# Patient Record
Sex: Female | Born: 1970 | Race: Black or African American | Hispanic: No | Marital: Married | Smoking: Never smoker
Health system: Southern US, Community
[De-identification: ages and names within clinical notes are randomized; demographics above are authoritative.]

## PROBLEM LIST (undated history)

## (undated) DIAGNOSIS — R51 Headache: Secondary | ICD-10-CM

## (undated) DIAGNOSIS — IMO0001 Reserved for inherently not codable concepts without codable children: Secondary | ICD-10-CM

## (undated) DIAGNOSIS — D649 Anemia, unspecified: Secondary | ICD-10-CM

## (undated) DIAGNOSIS — K219 Gastro-esophageal reflux disease without esophagitis: Secondary | ICD-10-CM

## (undated) DIAGNOSIS — R519 Headache, unspecified: Secondary | ICD-10-CM

## (undated) DIAGNOSIS — M1990 Unspecified osteoarthritis, unspecified site: Secondary | ICD-10-CM

## (undated) DIAGNOSIS — G4730 Sleep apnea, unspecified: Secondary | ICD-10-CM

## (undated) DIAGNOSIS — K589 Irritable bowel syndrome without diarrhea: Secondary | ICD-10-CM

## (undated) DIAGNOSIS — F32A Depression, unspecified: Secondary | ICD-10-CM

## (undated) DIAGNOSIS — G4733 Obstructive sleep apnea (adult) (pediatric): Secondary | ICD-10-CM

## (undated) DIAGNOSIS — F419 Anxiety disorder, unspecified: Secondary | ICD-10-CM

## (undated) DIAGNOSIS — Z8489 Family history of other specified conditions: Secondary | ICD-10-CM

## (undated) DIAGNOSIS — J189 Pneumonia, unspecified organism: Secondary | ICD-10-CM

## (undated) DIAGNOSIS — J4540 Moderate persistent asthma, uncomplicated: Secondary | ICD-10-CM

## (undated) DIAGNOSIS — I10 Essential (primary) hypertension: Secondary | ICD-10-CM

## (undated) DIAGNOSIS — T8859XA Other complications of anesthesia, initial encounter: Secondary | ICD-10-CM

## (undated) DIAGNOSIS — M79673 Pain in unspecified foot: Secondary | ICD-10-CM

## (undated) DIAGNOSIS — E119 Type 2 diabetes mellitus without complications: Secondary | ICD-10-CM

## (undated) DIAGNOSIS — M797 Fibromyalgia: Secondary | ICD-10-CM

## (undated) DIAGNOSIS — T4145XA Adverse effect of unspecified anesthetic, initial encounter: Secondary | ICD-10-CM

## (undated) DIAGNOSIS — I447 Left bundle-branch block, unspecified: Secondary | ICD-10-CM

## (undated) DIAGNOSIS — F329 Major depressive disorder, single episode, unspecified: Secondary | ICD-10-CM

## (undated) DIAGNOSIS — M069 Rheumatoid arthritis, unspecified: Secondary | ICD-10-CM

## (undated) HISTORY — PX: OTHER SURGICAL HISTORY: SHX169

## (undated) HISTORY — DX: Essential (primary) hypertension: I10

## (undated) HISTORY — DX: Fibromyalgia: M79.7

## (undated) HISTORY — PX: CHOLECYSTECTOMY: SHX55

## (undated) HISTORY — PX: TONSILLECTOMY: SUR1361

## (undated) HISTORY — DX: Rheumatoid arthritis, unspecified: M06.9

## (undated) HISTORY — PX: LAPAROSCOPIC GASTRIC SLEEVE RESECTION: SHX5895

## (undated) HISTORY — PX: DILATION AND CURETTAGE OF UTERUS: SHX78

---

## 2002-04-06 ENCOUNTER — Emergency Department (HOSPITAL_COMMUNITY): Admission: EM | Admit: 2002-04-06 | Discharge: 2002-04-06 | Payer: Self-pay | Admitting: Emergency Medicine

## 2005-07-08 ENCOUNTER — Emergency Department (HOSPITAL_COMMUNITY): Admission: EM | Admit: 2005-07-08 | Discharge: 2005-07-09 | Payer: Self-pay | Admitting: Emergency Medicine

## 2006-01-21 ENCOUNTER — Ambulatory Visit (HOSPITAL_COMMUNITY): Admission: RE | Admit: 2006-01-21 | Discharge: 2006-01-21 | Payer: Self-pay | Admitting: Preventative Medicine

## 2006-11-27 ENCOUNTER — Ambulatory Visit (HOSPITAL_COMMUNITY): Admission: RE | Admit: 2006-11-27 | Discharge: 2006-11-27 | Payer: Self-pay | Admitting: Preventative Medicine

## 2007-07-28 ENCOUNTER — Emergency Department (HOSPITAL_COMMUNITY): Admission: EM | Admit: 2007-07-28 | Discharge: 2007-07-28 | Payer: Self-pay | Admitting: Emergency Medicine

## 2007-08-02 ENCOUNTER — Emergency Department (HOSPITAL_COMMUNITY): Admission: EM | Admit: 2007-08-02 | Discharge: 2007-08-02 | Payer: Self-pay | Admitting: Emergency Medicine

## 2007-12-20 ENCOUNTER — Emergency Department (HOSPITAL_COMMUNITY): Admission: EM | Admit: 2007-12-20 | Discharge: 2007-12-20 | Payer: Self-pay | Admitting: Emergency Medicine

## 2007-12-22 ENCOUNTER — Emergency Department (HOSPITAL_COMMUNITY): Admission: EM | Admit: 2007-12-22 | Discharge: 2007-12-22 | Payer: Self-pay | Admitting: Emergency Medicine

## 2008-08-30 ENCOUNTER — Inpatient Hospital Stay (HOSPITAL_COMMUNITY): Admission: AD | Admit: 2008-08-30 | Discharge: 2008-08-30 | Payer: Self-pay | Admitting: Obstetrics & Gynecology

## 2008-09-21 ENCOUNTER — Inpatient Hospital Stay (HOSPITAL_COMMUNITY): Admission: AD | Admit: 2008-09-21 | Discharge: 2008-09-21 | Payer: Self-pay | Admitting: Obstetrics & Gynecology

## 2008-09-27 ENCOUNTER — Encounter: Payer: Self-pay | Admitting: Obstetrics and Gynecology

## 2008-09-27 ENCOUNTER — Encounter: Payer: Self-pay | Admitting: Obstetrics & Gynecology

## 2008-09-27 ENCOUNTER — Other Ambulatory Visit: Admission: RE | Admit: 2008-09-27 | Discharge: 2008-09-27 | Payer: Self-pay | Admitting: Obstetrics & Gynecology

## 2008-09-27 ENCOUNTER — Ambulatory Visit: Payer: Self-pay | Admitting: Obstetrics and Gynecology

## 2008-09-27 LAB — CONVERTED CEMR LAB
BUN: 15 mg/dL (ref 6–23)
CO2: 22 meq/L (ref 19–32)
Calcium: 9 mg/dL (ref 8.4–10.5)
Chloride: 109 meq/L (ref 96–112)
Creatinine, Ser: 0.81 mg/dL (ref 0.40–1.20)
Glucose, Bld: 98 mg/dL (ref 70–99)

## 2008-10-18 ENCOUNTER — Ambulatory Visit: Payer: Self-pay | Admitting: Obstetrics and Gynecology

## 2009-05-19 ENCOUNTER — Emergency Department (HOSPITAL_COMMUNITY): Admission: EM | Admit: 2009-05-19 | Discharge: 2009-05-19 | Payer: Self-pay | Admitting: Emergency Medicine

## 2009-11-07 ENCOUNTER — Ambulatory Visit (HOSPITAL_COMMUNITY): Admission: RE | Admit: 2009-11-07 | Discharge: 2009-11-07 | Payer: Self-pay | Admitting: Obstetrics

## 2009-11-25 ENCOUNTER — Inpatient Hospital Stay (HOSPITAL_COMMUNITY): Admission: AD | Admit: 2009-11-25 | Discharge: 2009-11-25 | Payer: Self-pay | Admitting: Obstetrics & Gynecology

## 2009-12-05 ENCOUNTER — Ambulatory Visit (HOSPITAL_COMMUNITY): Admission: RE | Admit: 2009-12-05 | Discharge: 2009-12-05 | Payer: Self-pay | Admitting: Obstetrics

## 2010-01-02 ENCOUNTER — Ambulatory Visit (HOSPITAL_COMMUNITY): Admission: RE | Admit: 2010-01-02 | Discharge: 2010-01-02 | Payer: Self-pay | Admitting: Obstetrics

## 2010-01-30 ENCOUNTER — Ambulatory Visit (HOSPITAL_COMMUNITY): Admission: RE | Admit: 2010-01-30 | Discharge: 2010-01-30 | Payer: Self-pay | Admitting: Obstetrics

## 2010-02-27 ENCOUNTER — Ambulatory Visit (HOSPITAL_COMMUNITY): Admission: RE | Admit: 2010-02-27 | Discharge: 2010-02-27 | Payer: Self-pay | Admitting: Obstetrics

## 2010-03-05 ENCOUNTER — Ambulatory Visit (HOSPITAL_COMMUNITY): Admission: RE | Admit: 2010-03-05 | Discharge: 2010-03-05 | Payer: Self-pay | Admitting: Obstetrics

## 2010-03-12 ENCOUNTER — Ambulatory Visit (HOSPITAL_COMMUNITY): Admission: RE | Admit: 2010-03-12 | Discharge: 2010-03-12 | Payer: Self-pay | Admitting: Obstetrics

## 2010-03-19 ENCOUNTER — Ambulatory Visit (HOSPITAL_COMMUNITY): Admission: RE | Admit: 2010-03-19 | Discharge: 2010-03-19 | Payer: Self-pay | Admitting: Obstetrics

## 2010-03-26 ENCOUNTER — Ambulatory Visit: Payer: Self-pay | Admitting: Obstetrics and Gynecology

## 2010-03-26 ENCOUNTER — Ambulatory Visit (HOSPITAL_COMMUNITY): Admission: RE | Admit: 2010-03-26 | Discharge: 2010-03-26 | Payer: Self-pay | Admitting: Obstetrics

## 2010-03-26 ENCOUNTER — Inpatient Hospital Stay (HOSPITAL_COMMUNITY): Admission: AD | Admit: 2010-03-26 | Discharge: 2010-03-26 | Payer: Self-pay | Admitting: Obstetrics

## 2010-04-06 ENCOUNTER — Ambulatory Visit: Payer: Self-pay | Admitting: Advanced Practice Midwife

## 2010-04-06 ENCOUNTER — Inpatient Hospital Stay (HOSPITAL_COMMUNITY): Admission: AD | Admit: 2010-04-06 | Discharge: 2010-04-06 | Payer: Self-pay | Admitting: Obstetrics

## 2010-06-07 ENCOUNTER — Emergency Department (HOSPITAL_COMMUNITY): Admission: EM | Admit: 2010-06-07 | Discharge: 2010-06-07 | Payer: Self-pay | Admitting: Emergency Medicine

## 2010-06-07 ENCOUNTER — Inpatient Hospital Stay (HOSPITAL_COMMUNITY)
Admission: AD | Admit: 2010-06-07 | Discharge: 2010-06-07 | Payer: Self-pay | Source: Home / Self Care | Admitting: Obstetrics

## 2010-09-06 ENCOUNTER — Inpatient Hospital Stay (HOSPITAL_COMMUNITY): Admission: AD | Admit: 2010-09-06 | Discharge: 2010-09-06 | Payer: Self-pay | Admitting: Obstetrics

## 2010-09-06 ENCOUNTER — Ambulatory Visit: Payer: Self-pay | Admitting: Obstetrics and Gynecology

## 2010-10-21 ENCOUNTER — Inpatient Hospital Stay (HOSPITAL_COMMUNITY)
Admission: AD | Admit: 2010-10-21 | Discharge: 2010-10-21 | Payer: Self-pay | Source: Home / Self Care | Attending: Obstetrics | Admitting: Obstetrics

## 2010-10-26 ENCOUNTER — Observation Stay (HOSPITAL_COMMUNITY)
Admission: AD | Admit: 2010-10-26 | Discharge: 2010-10-28 | Payer: Self-pay | Source: Home / Self Care | Attending: Obstetrics | Admitting: Obstetrics

## 2010-10-27 IMAGING — US US OB LIMITED
1 series · 9 of 9 positions shown · non-contrast
Comparison: none

OBSTETRICAL ULTRASOUND:
 This ultrasound was performed in The [HOSPITAL], and the [facility] report will be stored to [REDACTED] PACS.  This report is also available in [HOSPITAL]?s accessANYware.

[Series 1: us ob limited · 9 acquisitions, 9 frames shown]
[im 1/9]
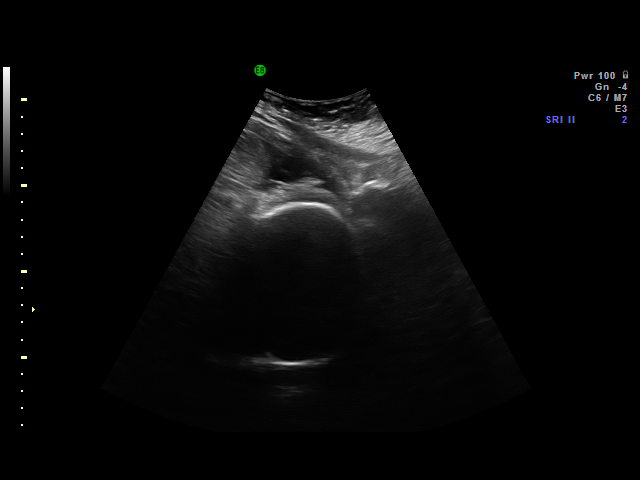
[im 2/9]
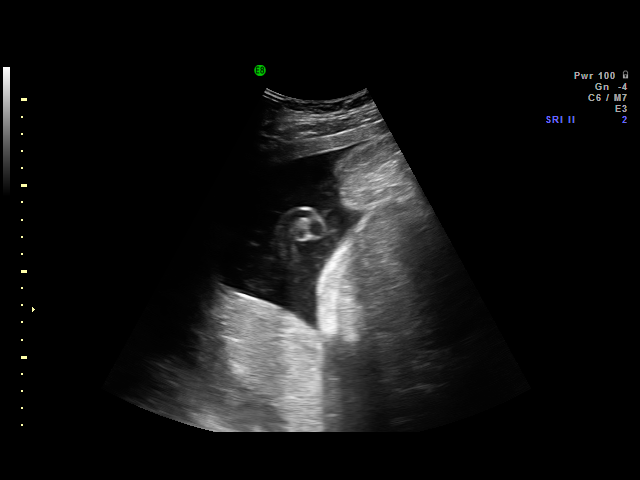
[im 3/9]
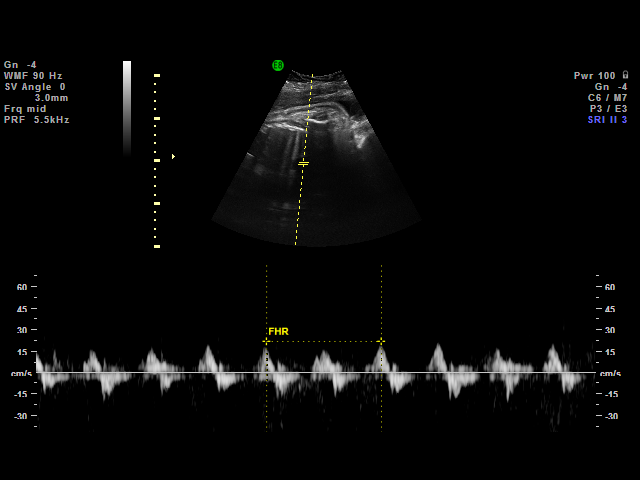
[im 4/9]
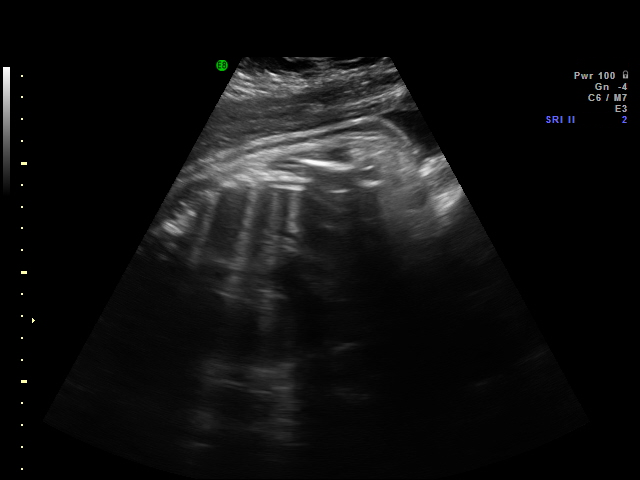
[im 5/9]
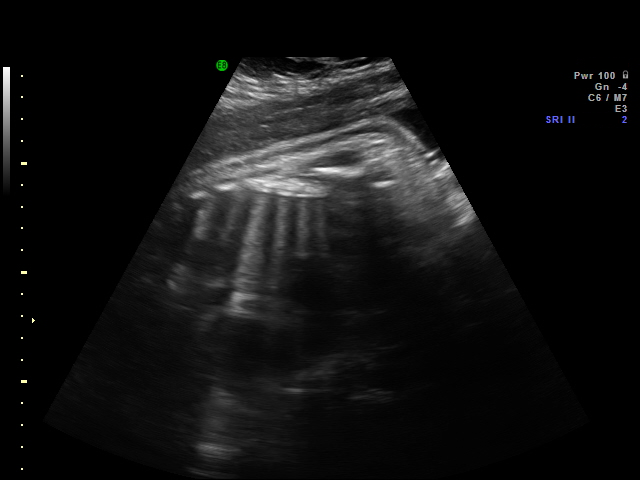
[im 6/9]
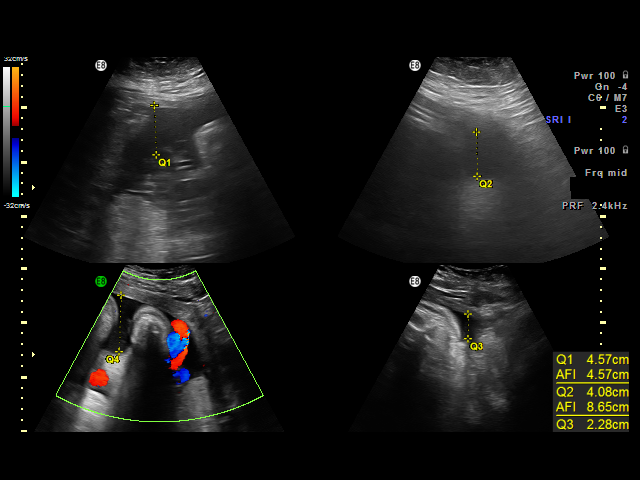
[im 7/9]
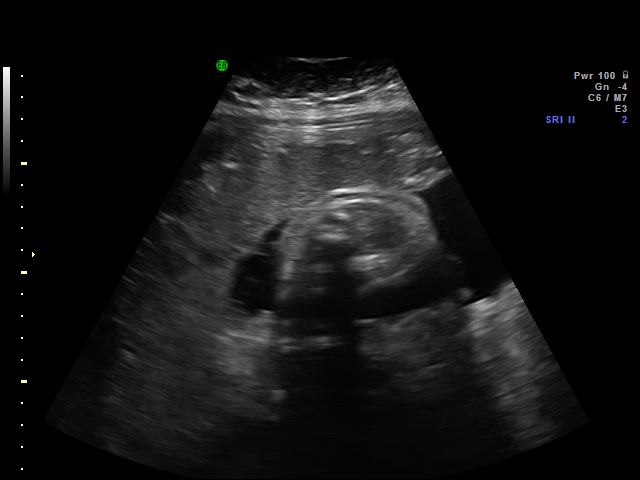
[im 8/9]
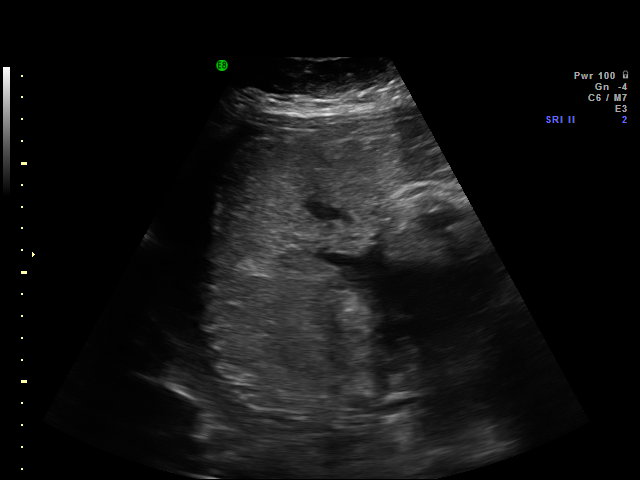
[im 9/9]
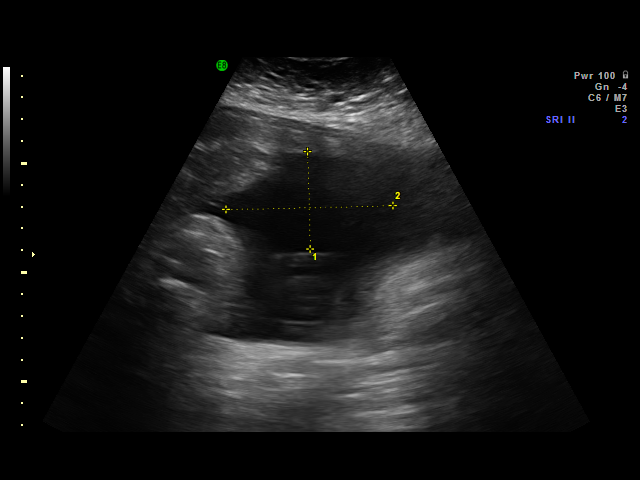

[9 of 9 positions shown; findings below may reference images not displayed]

IMPRESSION: [facility] has also been faxed to the ordering physician.

## 2010-11-11 ENCOUNTER — Emergency Department (HOSPITAL_COMMUNITY)
Admission: EM | Admit: 2010-11-11 | Discharge: 2010-11-11 | Payer: Self-pay | Source: Home / Self Care | Admitting: Emergency Medicine

## 2010-12-01 ENCOUNTER — Encounter: Payer: Self-pay | Admitting: Obstetrics

## 2011-01-20 LAB — CBC
HCT: 26.4 % — ABNORMAL LOW (ref 36.0–46.0)
Hemoglobin: 8.7 g/dL — ABNORMAL LOW (ref 12.0–15.0)
MCH: 26.4 pg (ref 26.0–34.0)
MCHC: 31.9 g/dL (ref 30.0–36.0)
MCHC: 33 g/dL (ref 30.0–36.0)
MCV: 82.6 fL (ref 78.0–100.0)
Platelets: 442 10*3/uL — ABNORMAL HIGH (ref 150–400)
RBC: 3.24 MIL/uL — ABNORMAL LOW (ref 3.87–5.11)
RDW: 13.9 % (ref 11.5–15.5)
WBC: 8.3 10*3/uL (ref 4.0–10.5)

## 2011-01-20 LAB — WET PREP, GENITAL: Clue Cells Wet Prep HPF POC: NONE SEEN

## 2011-01-20 LAB — SAMPLE TO BLOOD BANK

## 2011-01-20 LAB — GC/CHLAMYDIA PROBE AMP, GENITAL: Chlamydia, DNA Probe: NEGATIVE

## 2011-01-22 LAB — CBC
Hemoglobin: 12.6 g/dL (ref 12.0–15.0)
MCV: 85.6 fL (ref 78.0–100.0)
Platelets: 336 10*3/uL (ref 150–400)
RBC: 4.4 MIL/uL (ref 3.87–5.11)
WBC: 7 10*3/uL (ref 4.0–10.5)

## 2011-01-27 LAB — COMPREHENSIVE METABOLIC PANEL
ALT: 29 U/L (ref 0–35)
ALT: 33 U/L (ref 0–35)
AST: 28 U/L (ref 0–37)
Albumin: 2.9 g/dL — ABNORMAL LOW (ref 3.5–5.2)
Alkaline Phosphatase: 113 U/L (ref 39–117)
BUN: 6 mg/dL (ref 6–23)
CO2: 21 mEq/L (ref 19–32)
Calcium: 8.7 mg/dL (ref 8.4–10.5)
GFR calc Af Amer: >60 mL/min (ref >60)
GFR calc non Af Amer: >60 mL/min (ref >60)
Glucose, Bld: 173 mg/dL — ABNORMAL HIGH (ref 70–99)
Potassium: 4.1 mEq/L (ref 3.5–5.1)
Sodium: 134 mEq/L — ABNORMAL LOW (ref 135–145)
Sodium: 137 mEq/L (ref 135–145)
Total Protein: 5.6 g/dL — ABNORMAL LOW (ref 6.0–8.3)

## 2011-01-27 LAB — CBC
HCT: 37.8 % (ref 36.0–46.0)
Hemoglobin: 12.8 g/dL (ref 12.0–15.0)
MCHC: 33.1 g/dL (ref 30.0–36.0)
MCHC: 33.8 g/dL (ref 30.0–36.0)
Platelets: 419 10*3/uL — ABNORMAL HIGH (ref 150–400)
RBC: 4.35 MIL/uL (ref 3.87–5.11)
RDW: 14.5 % (ref 11.5–15.5)

## 2011-01-27 LAB — LACTATE DEHYDROGENASE: LDH: 177 U/L (ref 94–250)

## 2011-02-05 ENCOUNTER — Ambulatory Visit: Payer: Medicaid Other

## 2011-02-12 ENCOUNTER — Encounter (HOSPITAL_COMMUNITY)
Admission: RE | Admit: 2011-02-12 | Discharge: 2011-02-12 | Disposition: A | Discharge status: Home / Self Care | Payer: Medicaid Other | Source: Ambulatory Visit | Attending: Obstetrics and Gynecology | Admitting: Obstetrics and Gynecology

## 2011-02-12 LAB — CBC
HCT: 32 % — ABNORMAL LOW (ref 36.0–46.0)
MCHC: 29.4 g/dL — ABNORMAL LOW (ref 30.0–36.0)
MCV: 66.7 fL — ABNORMAL LOW (ref 78.0–100.0)
RDW: 20.6 % — ABNORMAL HIGH (ref 11.5–15.5)
WBC: 7.9 10*3/uL (ref 4.0–10.5)

## 2011-02-13 NOTE — H&P (Signed)
NAME:  Michele Craig, Michele Craig                ACCOUNT NO.:  [id number]  MEDICAL RECORD NO.:  [id number]         PATIENT TYPE:  WAMB  LOCATION:                                [facility]  PHYSICIAN:  Osborn Coho, M.D.   DATE OF BIRTH:  09-24-1971  DATE OF ADMISSION:  02/19/2011 DATE OF DISCHARGE:                             HISTORY & PHYSICAL   HISTORY OF PRESENT ILLNESS:  Ms. Breslin is a 40 year old married black female, para 4-0-1-4, presenting for hysteroscopy, D and C with endometrial ablation because of menorrhagia and anemia.  The patient states that since her delivery in March 2011, she has experienced continuous vaginal bleeding.  The patient initially bled for three consecutive months requiring her to change two overnight pads, due to clots, every hour.  She occasionally will experience cramping but primarily had lower back pain that she rated as a 10/10 on a 10-point pain scale.  The patient did find some relief from her lower back pain with ibuprofen 800 mg decreasing it to 7/10 on a 10-point pain scale. For this prolonged heavy bleeding, the patient was given IV Premarin, oral Premarin and oral contraceptives, however, the latter actually increased her vaginal bleeding.  The patient was then placed on oral estrogen with birth control pills followed by  Depo-Provera. None of  these measures achieved adequate bleeding control. She denies any changes in her bowel habits, urinary tract symptoms, vaginitis symptoms, dizziness, or chest pain.  She does admit to fatigue and shortness of breath with exertion.  Since November 2011, being on Depo-Provera, the patient has had primarily spotting on a daily basis with occasional gushes of blood without any warning.  A pelvic ultrasound in December 2011 showed a uterus measuring 12.1 x 7.2 x 7.8 cm with an endometrium measuring 19 mm.  Her right ovary was not visualized due to bowel gas and her left ovary measured 3.9 x 2.9 x 2.3 cm and  appeared to have normal size and echotexture.  There were no adnexal masses seen and only a small amount of free fluid was in the pelvis.  It is interesting to note that a comment on the ultrasound report showed that there was mildly heterogeneous echotexture throughout the uterus which could possibly be adenomyosis.  The patient's hemoglobin/hematocrit has been as low as 8.7/26 respectively.  However, her most recent results in March 2012 showed a hemoglobin of 9, hematocrit 30.6.  The patient's TSH and prolactin tests were both normal.  Given the protracted and debilitating nature of her vaginal bleeding, the patient reviewed both medical and surgical management options.  Having utilized the majority of the medical options with suboptimal results, the patient has decided to proceed with hysteroscopy, D and C, and endometrial ablation.  OBSTETRICAL HISTORY:  Gravida 5, para 4-0-1-4.  The patient had a history of gestational diabetes.  The patient had cesarean section in 1995, spontaneous vaginal birth in 1997, spontaneous vaginal birth in 2001, and a cesarean section in 2011.  GYNECOLOGIC HISTORY:  Menarche at 40 years old.  Last menstrual period, see history of present illness.  The patient has a remote  history of an abnormal Pap smear, however, it was simply repeated and had been normal since that time.  She denies any history of sexually transmitted diseases.  Her last Pap smear was in 2012.  MEDICAL HISTORY: 1. Asthma which has never required hospitalization or intubation. 2. Anemia. 3. Seasonal allergies. 4. Hypercholesterolemia. 5. Depression.  SURGICAL HISTORY: 1. In 1992, tonsillectomy. 2. In 1999, cholecystectomy. 3. In 2011, bilateral tubal ligation. The patient reports that she has a history of bleeding cessation with spinal anesthesia.  She does have a history of a blood transfusion in 1995.  FAMILY HISTORY:  Unknown due to her being adopted.  HABITS:  She does  not use alcohol, tobacco, or illicit drugs.  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  CURRENT MEDICATIONS: 1. Advair 250/50 as directed. 2. Albuterol as needed. 3. Singulair daily. 4. Zyrtec daily as needed. 5. Depo-Provera 150 mg IM every 12 weeks. 6. Ferrous sulfate one tablet daily.  The patient is allergic to [facility], which causes excessive vomiting and swelling inside her mouth and "internal organs."  Denies any sensitivities to latex, soy, or peanuts.  REVIEW OF SYSTEMS:  The patient does have a left wrist pain secondary to carpal tunnel syndrome.  Denies any chest pain, headache, vision changes, dizziness, nausea, vomiting, diarrhea, vaginitis symptoms, urinary tract symptoms, myalgias, arthralgias, skin rashes, and except as mentioned in history of present illness, the patient's review of systems is otherwise negative.  PHYSICAL EXAMINATION:  VITAL SIGNS:  Blood pressure is 140/78, pulse is 100, respirations 18, temperature 98.2 degrees Fahrenheit orally, weight 409 pounds, height 5 feet 7-1/2 inches tall.  Body mass index 63.1. NECK:  Supple without masses.  There is no thyromegaly or cervical adenopathy. HEART:  Tachycardic with regular rhythm. LUNGS:  Clear. BACK:  No CVA tenderness. ABDOMEN:  No tenderness, guarding, rebound, masses, or organomegaly. EXTREMITIES:  No clubbing, cyanosis, or edema. PELVIC:  EG/BUS is normal.  Vagina is normal.  Cervix is nontender without lesions.  Uterus is unable to be assessed secondary to body habitus.  Adnexa no tenderness or masses.  IMPRESSION: 1. Menorrhagia. 2. Anemia.  DISPOSITION:  A discussion was held with the patient regarding the indications for her procedures along with their risks which include but are not limited to reaction to anesthesia, damage to adjacent organs, infection, excessive bleeding, and the possibility that the patient's  bleeding may not be totally controlled with the  ablation procedure.  The patient verbalized understanding of these risks and has consented to proceed  with hysteroscopy, D and C with endometrial ablation at [facility] on February 19, 2011, at 11 o'clock a.m.     Elmira J. Lowell Guitar, P.A.-C   ______________________________ Osborn Coho, M.D.    EJP/MEDQ  D:  02/05/2011  T:  02/06/2011  Job:  [id number]  Electronically Signed by Raylene Everts. on 02/07/2011 01:12:01 PM Electronically Signed by Osborn Coho M.D. on 02/13/2011 10:08:23 AM

## 2011-02-19 ENCOUNTER — Ambulatory Visit (HOSPITAL_COMMUNITY)
Admission: RE | Admit: 2011-02-19 | Discharge: 2011-02-19 | Disposition: A | Discharge status: Home / Self Care | Payer: Medicaid Other | Source: Ambulatory Visit | Attending: Obstetrics and Gynecology | Admitting: Obstetrics and Gynecology

## 2011-02-19 ENCOUNTER — Other Ambulatory Visit: Payer: Self-pay | Admitting: Obstetrics and Gynecology

## 2011-02-19 DIAGNOSIS — D649 Anemia, unspecified: Secondary | ICD-10-CM | POA: Insufficient documentation

## 2011-02-19 DIAGNOSIS — N920 Excessive and frequent menstruation with regular cycle: Secondary | ICD-10-CM | POA: Insufficient documentation

## 2011-03-06 ENCOUNTER — Emergency Department (HOSPITAL_COMMUNITY)
Admission: EM | Admit: 2011-03-06 | Discharge: 2011-03-06 | Disposition: A | Discharge status: Home / Self Care | Payer: Medicaid Other | Attending: Emergency Medicine | Admitting: Emergency Medicine

## 2011-03-06 ENCOUNTER — Emergency Department (HOSPITAL_COMMUNITY): Payer: Medicaid Other

## 2011-03-06 DIAGNOSIS — J40 Bronchitis, not specified as acute or chronic: Secondary | ICD-10-CM | POA: Insufficient documentation

## 2011-03-06 DIAGNOSIS — R0989 Other specified symptoms and signs involving the circulatory and respiratory systems: Secondary | ICD-10-CM | POA: Insufficient documentation

## 2011-03-06 DIAGNOSIS — R05 Cough: Secondary | ICD-10-CM | POA: Insufficient documentation

## 2011-03-06 DIAGNOSIS — R059 Cough, unspecified: Secondary | ICD-10-CM | POA: Insufficient documentation

## 2011-03-06 DIAGNOSIS — R0609 Other forms of dyspnea: Secondary | ICD-10-CM | POA: Insufficient documentation

## 2011-03-06 DIAGNOSIS — J069 Acute upper respiratory infection, unspecified: Secondary | ICD-10-CM | POA: Insufficient documentation

## 2011-03-06 DIAGNOSIS — J45909 Unspecified asthma, uncomplicated: Secondary | ICD-10-CM | POA: Insufficient documentation

## 2011-03-20 NOTE — Op Note (Signed)
  NAME:  Michele Craig, Michele Craig                ACCOUNT NO.:  [id number]  MEDICAL RECORD NO.:  [id number]           PATIENT TYPE:  O  LOCATION:  [facility]                          [facility]  [facility]  PHYSICIAN:  Osborn Coho, M.D.   DATE OF BIRTH:  01/26/1971  DATE OF PROCEDURE:  02/19/2011 DATE OF DISCHARGE:                              OPERATIVE REPORT   PREOPERATIVE DIAGNOSES: 1. Menorrhagia. 2. Anemia.  POSTOPERATIVE DIAGNOSES: 1. Menorrhagia. 2. Anemia.  PROCEDURE: 1. Hysteroscopy. 2. D and C. 3. Ablation via NovaSure.  ATTENDING:  Osborn Coho, MD.  ANESTHESIA:  General.  SPECIMENS TO PATHOLOGY:  Endometrial curettings.  FINDINGS:  Uterus sounded to 13-1/2 cm.  Cervical length measured 6 cm. Cavity length measured 6-1/2 cm.  Cavity width measured 4.9 cm and ablation was performed for 1 minute and 25 seconds at a power of 175 watts.  FLUIDS:  500 mL  URINE OUTPUT:  Quantity sufficient via straight cath prior to procedure.  ESTIMATED BLOOD LOSS:  Minimal.  HYSTEROSCOPIC FLUID DEFICIT:  125 mL  COMPLICATIONS:  None.  DESCRIPTION OF PROCEDURE:  The patient was taken to the operating room after risks, benefits, and alternatives were discussed with the patient. The patient verbalized understanding, and consent signed and witness. The patient was placed under general per Anesthesia, and prepped and draped in the normal sterile fashion in the dorsal lithotomy position. A bivalve speculum was placed in the patient's vagina and the anterior lip of the cervix was grasped with single-tooth tenaculum.  A paracervical block was administered using a total of 10 mL of 1% lidocaine.  The uterus sounded to 13-1/2 cm and the hysteroscope was placed into the uterine cavity and no obvious cavitary lesions were noted.  The hysteroscope was removed and ablation was performed without difficulty.  The hysteroscope was reintroduced and good ablation results were noted, except for small portion,  may be the left lateral sidewall of the uterine cavity and the lower uterine segment.  All instruments were removed.  There was good hemostasis at the tenaculum site.  Count was correct.  The patient tolerated the procedure well and was returned to the recovery room in good condition.     Osborn Coho, M.D.     AR/MEDQ  D:  02/19/2011  T:  02/20/2011  Job:  [id number]  Electronically Signed by Osborn Coho M.D. on 03/20/2011 07:39:07 AM

## 2011-03-25 NOTE — Group Therapy Note (Signed)
NAME:  Michele Craig, Michele Craig                ACCOUNT NO.:  [id number]   MEDICAL RECORD NO.:  [id number]          PATIENT TYPE:  WOC   LOCATION:  [facility]                   FACILITY:  [facility]   PHYSICIAN:  Argentina Donovan, MD        DATE OF BIRTH:  [birth year]   DATE OF SERVICE:  09/27/2008                                  CLINIC NOTE   The patient is a 40 year old African-American female, gravida 3, para 3-  0-0-3, her youngest child 25 years old, who over the past year has  started having markedly irregular periods and was seen in the MAU a  month ago because of a long painful heavy bleeding episode.  Ultrasound  at that time showed endometrial thickening of 2.1 cm.  The patient is 5  feet 6 inches tall and weighs 377 pounds and probably has polycystic  ovarian syndrome.  The patient has had a past history of C-section,  gallbladder removal and tonsillectomy.  She is on medication for asthma,  an __________ inhaler, Advair, she takes Zyrtec, and she has been taking  Toradol recently since she was in the emergency room for abdominal  cramping.   PHYSICAL EXAMINATION:  GENERAL:  She is a very large African-American  female.  ABDOMEN:  Soft, flat, nontender.  PELVIC:  The external genitalia is normal.  BUS within normal limits.  Vagina is clean and well rugated.  The cervix is hypertrophied, parous  and well epithelialized.  The uterus and adnexa could not be palpated,  though the uterus is normal on ultrasound.  The uterus was sounded to a  depth of 10 cm after Pap smear was taken, and endometrial biopsy was  done without incident.   I am going to have the patient come back in 2 weeks for the results of  her endometrial biopsy, as well as a hemoglobin A1c and a CMET that we  have drawn today.  I have talked to her about the treatments and that it  would depend on whether she has atypical endometrial hyperplasia or a  regular endometrial hyperplasia that we should be treating her with  regular  withdrawals or we could use an IUD Mirena type.  Depo-Provera I  have basically been against a woman this size because of the weight  problem that she has anyway.  Her blood pressure today is 116/69 with a  pulse of 79.   IMPRESSION:  1. Endometrial hyperplasia by ultrasound.  2. Morbid obesity.  3. Oligomenorrhea with menorrhagia.           ______________________________  Argentina Donovan, MD     PR/MEDQ  D:  09/27/2008  T:  09/27/2008  Job:  [id number]

## 2011-06-13 ENCOUNTER — Other Ambulatory Visit: Payer: Self-pay | Admitting: Obstetrics

## 2011-06-27 ENCOUNTER — Other Ambulatory Visit: Payer: Self-pay | Admitting: Obstetrics

## 2011-08-05 ENCOUNTER — Encounter: Payer: Self-pay | Admitting: *Deleted

## 2011-08-05 ENCOUNTER — Encounter: Payer: Medicaid Other | Attending: Physician Assistant | Admitting: *Deleted

## 2011-08-05 DIAGNOSIS — Z713 Dietary counseling and surveillance: Secondary | ICD-10-CM | POA: Insufficient documentation

## 2011-08-05 NOTE — Progress Notes (Signed)
  Medical Nutrition Therapy:  Appt start time: 1115 end time:  1215.   Assessment:  Primary concerns today: weight management and diabetes. Pt is here today for weight and diabetes management. She plans to have bariatric surgery (gastric bypass) with Dr. Lily Peer in [place]. She is here today to get a jump start on her weight loss and control her diabetes in preparation for surgery.  MEDICATIONS: See updated medication list.   DIETARY INTAKE:  Usual eating pattern includes 2-3 meals and 1-2 snacks per day. Pt has recently had a problem with meal skipping yet has gotten better in the past 6 months.  24-hr recall:  B (8-9 AM): Atkin's Protein or Oh Yeah Protein Shake  Snk (AM): celery or pretzel (sometimes) L (12-1 PM): Stuffed chicken breast (w/ spinach/cheese) OR Spinach salad w/ chicken Snk (PM): celery OR carrot sticks OR 100-Calorie Packs D (6-8 PM): Spinach salad w/ chicken OR Baked chicken/Steak w/ vegetables and starch (bread, potatoes, etc) OR Spaghetti w/ meat sauce Snk (PM): Ice cream OR Blue Bunny Carb control ice cream Beverages: Water, Protein shake, Crystal Light, Diet Soda  Usual physical activity: Limited based on weight and back problems  Estimated energy needs: 2500 calories 280 g carbohydrates 175 g protein 75 g fat  CBG monitoring: Daily (fasting, 2 hrs after breakfast) CBG range: 97-101 (fasting), 113-140 (2 hrs after supper) Last A1c level: 7.0%  Progress Towards Goal(s):  In progress.   Nutritional Diagnosis:  [place]-3.3 Overweight/obesity As related to poor dietary habits and physical inactivity.  As evidenced by pt with BMI of 66%.    Intervention:  Nutrition education.  Handouts given during visit include:  Core I Diabetes Education Information  ADA's HgbA1c: "What's My Number"  Pre-Op Bariatric Surgery Diet  Monitoring/Evaluation:  Dietary intake, exercise, weight loss, and body weight and follow up in one month.

## 2011-08-05 NOTE — Patient Instructions (Addendum)
Goals:  Eat 3 meals/day, Avoid meal skipping   Increase protein rich foods  Follow "Plate Method" for portion/carbohydrate control  Limit carbohydrate1-2 servings/meal   Aim for >30 min of physical activity daily, as able  Limit sugar-sweetened beverages and concentrated sweets  Follow Bariatric Surgery Pre-Op Diet for weight loss before surgery  1500 kcal diet  80-100 grams protein  100-150 grams carbohdyrate

## 2011-08-12 LAB — URINALYSIS, ROUTINE W REFLEX MICROSCOPIC
Bilirubin Urine: NEGATIVE
Specific Gravity, Urine: 1.025
Urobilinogen, UA: 0.2
pH: 5.5

## 2011-08-12 LAB — POCT PREGNANCY, URINE: Preg Test, Ur: NEGATIVE

## 2011-08-12 LAB — CBC
HCT: 39.2
Hemoglobin: 13.5
Hemoglobin: 13.2
MCHC: 33.2
RDW: 13.3
RDW: 13.3
WBC: 6.8

## 2011-08-12 LAB — URINE MICROSCOPIC-ADD ON

## 2011-08-12 LAB — WET PREP, GENITAL: WBC, Wet Prep HPF POC: NONE SEEN

## 2011-08-12 LAB — GC/CHLAMYDIA PROBE AMP, GENITAL: Chlamydia, DNA Probe: NEGATIVE

## 2011-08-21 LAB — URINE CULTURE: Colony Count: >=100000

## 2011-08-21 LAB — URINALYSIS, ROUTINE W REFLEX MICROSCOPIC
Bilirubin Urine: NEGATIVE
Glucose, UA: NEGATIVE
Leukocytes, UA: NEGATIVE
Nitrite: NEGATIVE
Specific Gravity, Urine: 1.015
pH: 6
pH: 6.5

## 2011-08-21 LAB — CBC
HCT: 43
MCV: 86.4
Platelets: 374
RDW: 12.9

## 2011-08-21 LAB — COMPREHENSIVE METABOLIC PANEL
Albumin: 3.4 — ABNORMAL LOW
BUN: 13
Creatinine, Ser: 0.86
Total Bilirubin: 0.5
Total Protein: 7.2

## 2011-08-21 LAB — DIFFERENTIAL
Basophils Absolute: 0.1
Lymphocytes Relative: 25
Monocytes Absolute: 0.7
Monocytes Relative: 8
Neutro Abs: 6

## 2011-08-21 LAB — URINE MICROSCOPIC-ADD ON

## 2011-09-02 ENCOUNTER — Ambulatory Visit: Payer: Medicaid Other | Admitting: *Deleted

## 2011-10-14 IMAGING — CR DG CHEST 2V
2 series · 2 of 2 positions shown · non-contrast
Comparison: Chest 12/22/2007.

CLINICAL DATA: Cough and shortness of breath.

CHEST - 2 VIEW

[w chest pa]
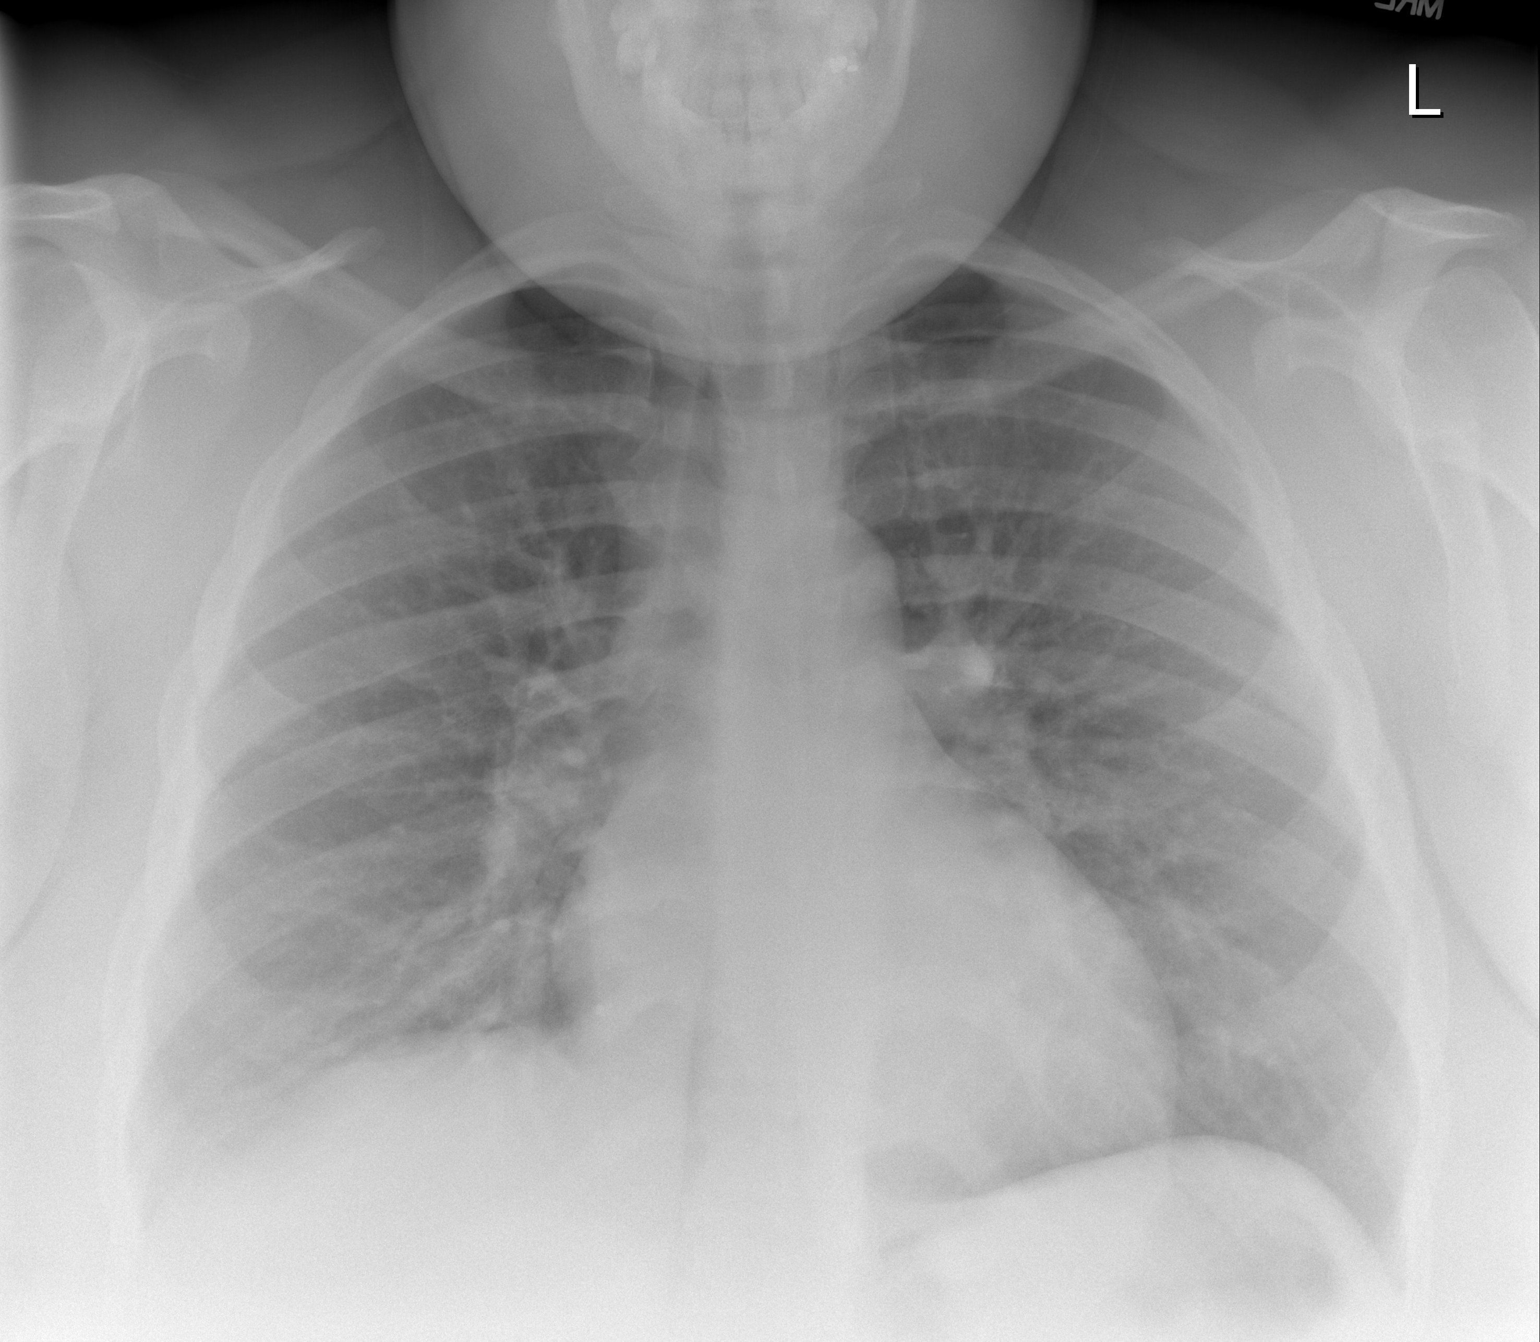

[w chest lat *]
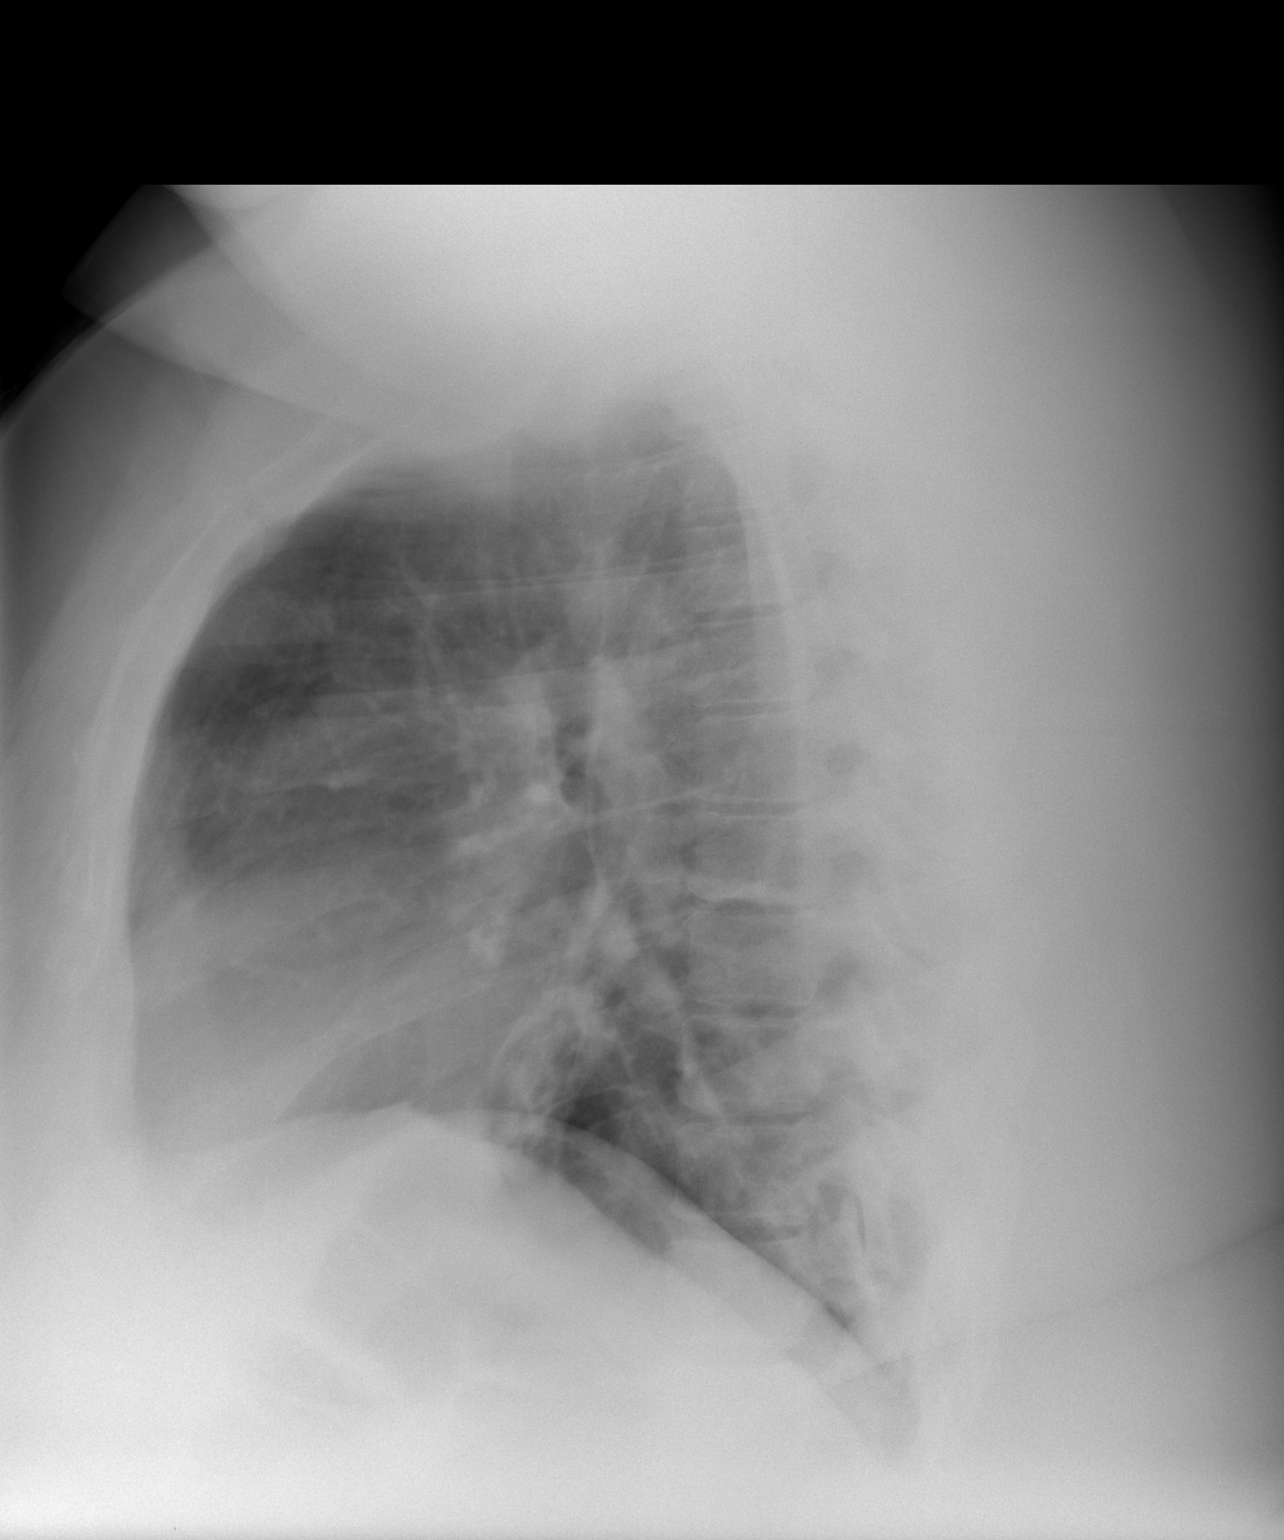

[2 of 2 positions shown; findings below may reference images not displayed]

FINDINGS: Peribronchial thickening is present.  No focal airspace
disease or pleural effusion.  Heart size normal.
IMPRESSION: Bronchitic change without focal process.

## 2011-10-18 ENCOUNTER — Encounter (HOSPITAL_COMMUNITY): Payer: Self-pay | Admitting: *Deleted

## 2011-10-18 ENCOUNTER — Emergency Department (HOSPITAL_COMMUNITY)
Admission: EM | Admit: 2011-10-18 | Discharge: 2011-10-18 | Disposition: A | Discharge status: Home / Self Care | Payer: Medicaid Other | Attending: Emergency Medicine | Admitting: Emergency Medicine

## 2011-10-18 DIAGNOSIS — I10 Essential (primary) hypertension: Secondary | ICD-10-CM | POA: Insufficient documentation

## 2011-10-18 DIAGNOSIS — E119 Type 2 diabetes mellitus without complications: Secondary | ICD-10-CM | POA: Insufficient documentation

## 2011-10-18 DIAGNOSIS — L0231 Cutaneous abscess of buttock: Secondary | ICD-10-CM | POA: Insufficient documentation

## 2011-10-18 DIAGNOSIS — L03317 Cellulitis of buttock: Secondary | ICD-10-CM | POA: Insufficient documentation

## 2011-10-18 DIAGNOSIS — J45909 Unspecified asthma, uncomplicated: Secondary | ICD-10-CM | POA: Insufficient documentation

## 2011-10-18 MED ORDER — LIDOCAINE-EPINEPHRINE 2 %-1:100000 IJ SOLN
INTRAMUSCULAR | Status: AC
  Administered 2011-10-18: 1 mL
  Filled 2011-10-18: qty 1

## 2011-10-18 MED ORDER — FLUCONAZOLE 150 MG PO TABS: 150.0000 mg | ORAL_TABLET | Freq: Every day | ORAL | Status: AC

## 2011-10-18 MED ORDER — HYDROCODONE-ACETAMINOPHEN 5-325 MG PO TABS: ORAL_TABLET | ORAL | Status: AC

## 2011-10-18 MED ORDER — DOXYCYCLINE HYCLATE 100 MG PO CAPS: 100.0000 mg | ORAL_CAPSULE | Freq: Two times a day (BID) | ORAL | Status: AC

## 2011-10-18 NOTE — ED Notes (Signed)
Abscess on coccyx area, started to drain last Sunday, now has smaller ones around area

## 2011-10-18 NOTE — ED Provider Notes (Signed)
Medical screening examination/treatment/procedure(s) were performed by non-physician practitioner and as supervising physician I was immediately available for consultation/collaboration.  Geoffery Lyons, MD 10/18/11 [phone]

## 2011-10-18 NOTE — ED Provider Notes (Signed)
History     CSN: [id number] Arrival date & time: 10/18/2011 12:42 PM   First MD Initiated Contact with Patient 10/18/11 1426      Chief Complaint  Patient presents with  . Abscess    buttocks    (Consider location/radiation/quality/duration/timing/severity/associated sxs/prior treatment) HPI Comments: Patient with swelling and, pain and drainage to upper right buttock for one week. Area has been draining for the past several days. Patient states she has new areas forming around the original area. These areas are painful. Patient denies fever or other abscesses in this area in the past. She has been taking Vicodin at home for pain with some mild relief.  Patient is a 40 y.o. female presenting with abscess. The history is provided by the patient and the spouse.  Abscess  This is a new problem. The current episode started more than one week ago. The onset was gradual. The problem has been gradually worsening. The abscess is present on the right buttock. The problem is moderate. The abscess is characterized by draining. Pertinent negatives include no fever and no vomiting.    Past Medical History  Diagnosis Date  . Asthma   . Hypertension   . Diabetes mellitus     Past Surgical History  Procedure Date  . Cesarean section   . Cholecystectomy   . Tonsillectomy   . Carpal tunnel release   . Dilation and curettage of uterus     No family history on file.  History  Substance Use Topics  . Smoking status: Never Smoker   . Smokeless tobacco: Not on file  . Alcohol Use: No    OB History    Grav Para Term Preterm Abortions TAB SAB Ect Mult Living                  Review of Systems  Constitutional: Negative for fever and chills.  Gastrointestinal: Negative for nausea and vomiting.  Musculoskeletal: Negative for myalgias.  Skin: Negative for color change and rash.    Allergies  Shrimp  Home Medications   Current Outpatient Rx  Name Route Sig Dispense Refill  .  ALBUTEROL SULFATE (2.5 MG/3ML) 0.083% IN NEBU Nebulization Take 2.5 mg by nebulization every 6 (six) hours as needed.      Marland Kitchen AMLODIPINE BESYLATE 10 MG PO TABS Oral Take 10 mg by mouth daily.      Marland Kitchen CETIRIZINE HCL 10 MG PO TABS Oral Take 10 mg by mouth daily.      Marland Kitchen FLUOXETINE HCL 20 MG PO CAPS Oral Take 20 mg by mouth daily.      Marland Kitchen FLUTICASONE-SALMETEROL 100-50 MCG/DOSE IN AEPB Inhalation Inhale 1 puff into the lungs every 12 (twelve) hours.      Marland Kitchen HYDROCODONE-ACETAMINOPHEN 5-500 MG PO TABS Oral Take 1 tablet by mouth every 6 (six) hours as needed. pain     . METFORMIN HCL ER (MOD) 500 MG PO TB24 Oral Take 500 mg by mouth daily with breakfast.      . MONTELUKAST SODIUM 10 MG PO TABS Oral Take 10 mg by mouth at bedtime.      . OMEPRAZOLE 10 MG PO CPDR Oral Take 10 mg by mouth daily.      Marland Kitchen PREDNISONE 20 MG PO TABS Oral Take 20 mg by mouth daily. Tapering dose finished yesterday       BP 112/63  Pulse 99  Temp(Src) 99.2 F (37.3 C) (Oral)  Resp 22  SpO2 96%  Physical Exam  Nursing note  and vitals reviewed. Constitutional: She is oriented to person, place, and time. She appears well-developed and well-nourished.  HENT:  Head: Normocephalic and atraumatic.  Eyes: Right eye exhibits no discharge. Left eye exhibits no discharge.  Genitourinary:       Draining abscess with approx 6cm induration right medial upper buttock with several surrounding areas of smaller induration. There is no signs of cellulitis.   Musculoskeletal: Normal range of motion.  Neurological: She is alert and oriented to person, place, and time.  Skin: Skin is warm and dry. No rash noted.  Psychiatric: She has a normal mood and affect.    ED Course  Procedures (including critical care time)  Labs Reviewed - No data to display No results found.   1. Abscess of right buttock    Patient seen and examined.  INCISION AND DRAINAGE Performed by: Carolee Rota Consent: Verbal consent obtained. Risks and  benefits: risks, benefits and alternatives were discussed Type: abscess  Body area: R upper buttock  Anesthesia: local infiltration  Local anesthetic: lidocaine 2% with epinephrine  Anesthetic total: 3 ml  Complexity: complex Blunt dissection to break up loculations  Drainage: purulent  Drainage amount: moderate  Packing material: 1/4 in iodoform gauze  Patient tolerance: Patient tolerated the procedure well with no immediate complications.  4:04 PM patient urged to follow up in 2 days to be the emergency department or with her primary care physician for packing removal and recheck. Patient urged to return sooner with worsening swelling, pain, redness, fever or she has any other concerns. Patient verbalizes understanding and agrees with plan.  4:05 PM Patient counseled on use of narcotic pain medications. Counseled not to combine these medications with others containing tylenol. Urged not to drink alcohol, drive, or perform any other activities that requires focus while taking these medications. The patient verbalizes understanding and agrees with the plan.     MDM  Patient with abscess, possibly pilonidal cyst, currently draining but I&D performed and packing placed. No sign of surrounding cellulitis. Patient placed on antibiotics given the size of the area involved.  Recheck indicated in 2 days for packing removal. Patient may need eventual surgical consult.        Eustace Moore [place], [place] 10/18/11 1606

## 2012-02-05 ENCOUNTER — Encounter: Payer: Self-pay | Admitting: Obstetrics and Gynecology

## 2012-04-01 ENCOUNTER — Inpatient Hospital Stay (HOSPITAL_COMMUNITY)
Admission: EM | Admit: 2012-04-01 | Discharge: 2012-04-04 | DRG: 125 | Disposition: A | Discharge status: Home / Self Care | Payer: Self-pay | Attending: Internal Medicine | Admitting: Internal Medicine

## 2012-04-01 ENCOUNTER — Encounter (HOSPITAL_COMMUNITY): Payer: Self-pay | Admitting: Emergency Medicine

## 2012-04-01 ENCOUNTER — Emergency Department (HOSPITAL_COMMUNITY): Payer: Self-pay

## 2012-04-01 DIAGNOSIS — J309 Allergic rhinitis, unspecified: Secondary | ICD-10-CM | POA: Diagnosis present

## 2012-04-01 DIAGNOSIS — I10 Essential (primary) hypertension: Secondary | ICD-10-CM

## 2012-04-01 DIAGNOSIS — L03211 Cellulitis of face: Secondary | ICD-10-CM

## 2012-04-01 DIAGNOSIS — E1165 Type 2 diabetes mellitus with hyperglycemia: Secondary | ICD-10-CM

## 2012-04-01 DIAGNOSIS — E119 Type 2 diabetes mellitus without complications: Secondary | ICD-10-CM | POA: Diagnosis present

## 2012-04-01 DIAGNOSIS — Z6841 Body Mass Index (BMI) 40.0 and over, adult: Secondary | ICD-10-CM

## 2012-04-01 DIAGNOSIS — L03213 Periorbital cellulitis: Secondary | ICD-10-CM | POA: Diagnosis present

## 2012-04-01 DIAGNOSIS — J45909 Unspecified asthma, uncomplicated: Secondary | ICD-10-CM | POA: Diagnosis present

## 2012-04-01 DIAGNOSIS — H00039 Abscess of eyelid unspecified eye, unspecified eyelid: Principal | ICD-10-CM | POA: Diagnosis present

## 2012-04-01 DIAGNOSIS — L0201 Cutaneous abscess of face: Secondary | ICD-10-CM

## 2012-04-01 LAB — POCT I-STAT, CHEM 8
Calcium, Ion: 1.17 mmol/L (ref 1.12–1.32)
Glucose, Bld: 161 mg/dL — ABNORMAL HIGH (ref 70–99)
HCT: 39 % (ref 36.0–46.0)
Hemoglobin: 13.3 g/dL (ref 12.0–15.0)
Potassium: 3.6 mEq/L (ref 3.5–5.1)
TCO2: 20 mmol/L (ref 0–100)

## 2012-04-01 LAB — CBC
MCH: 23.7 pg — ABNORMAL LOW (ref 26.0–34.0)
MCHC: 32 g/dL (ref 30.0–36.0)
Platelets: 394 10*3/uL (ref 150–400)
RDW: 18 % — ABNORMAL HIGH (ref 11.5–15.5)

## 2012-04-01 LAB — DIFFERENTIAL
Basophils Absolute: 0.1 10*3/uL (ref 0.0–0.1)
Basophils Relative: 1 % (ref 0–1)
Eosinophils Absolute: 0.2 10*3/uL (ref 0.0–0.7)
Monocytes Relative: 7 % (ref 3–12)
Neutro Abs: 5.6 10*3/uL (ref 1.7–7.7)
Neutrophils Relative %: 61 % (ref 43–77)

## 2012-04-01 LAB — CREATININE, SERUM
Creatinine, Ser: 0.73 mg/dL (ref 0.50–1.10)
GFR calc non Af Amer: >90 mL/min (ref >90)

## 2012-04-01 MED ORDER — PIPERACILLIN-TAZOBACTAM 3.375 G IVPB
INTRAVENOUS | Status: AC
  Filled 2012-04-01: qty 50

## 2012-04-01 MED ORDER — LORATADINE 10 MG PO TABS
10.0000 mg | ORAL_TABLET | Freq: Every day | ORAL | Status: DC
  Administered 2012-04-01 – 2012-04-04 (×4): 10 mg via ORAL
  Filled 2012-04-01 (×4): qty 1

## 2012-04-01 MED ORDER — PANTOPRAZOLE SODIUM 40 MG PO TBEC
40.0000 mg | DELAYED_RELEASE_TABLET | Freq: Every day | ORAL | Status: DC
  Administered 2012-04-01 – 2012-04-03 (×3): 40 mg via ORAL
  Filled 2012-04-01 (×3): qty 1

## 2012-04-01 MED ORDER — SODIUM CHLORIDE 0.9 % IV SOLN
INTRAVENOUS | Status: AC
  Administered 2012-04-01: 16:00:00 via INTRAVENOUS

## 2012-04-01 MED ORDER — PIPERACILLIN-TAZOBACTAM 3.375 G IVPB
3.3750 g | Freq: Three times a day (TID) | INTRAVENOUS | Status: DC
  Administered 2012-04-01: 3.375 g via INTRAVENOUS
  Filled 2012-04-01 (×3): qty 50

## 2012-04-01 MED ORDER — FLUOXETINE HCL 20 MG PO CAPS
20.0000 mg | ORAL_CAPSULE | Freq: Every day | ORAL | Status: DC
  Administered 2012-04-01 – 2012-04-04 (×4): 20 mg via ORAL
  Filled 2012-04-01 (×4): qty 1

## 2012-04-01 MED ORDER — OXYCODONE-ACETAMINOPHEN 5-325 MG PO TABS
1.0000 | ORAL_TABLET | Freq: Once | ORAL | Status: AC
  Administered 2012-04-01: 1 via ORAL
  Filled 2012-04-01: qty 1

## 2012-04-01 MED ORDER — ENOXAPARIN SODIUM 40 MG/0.4ML ~~LOC~~ SOLN: 40.0000 mg | SUBCUTANEOUS | Status: DC

## 2012-04-01 MED ORDER — IOHEXOL 300 MG/ML  SOLN
75.0000 mL | Freq: Once | INTRAMUSCULAR | Status: AC | PRN
  Administered 2012-04-01: 75 mL via INTRAVENOUS

## 2012-04-01 MED ORDER — VANCOMYCIN HCL IN DEXTROSE 1-5 GM/200ML-% IV SOLN
1000.0000 mg | Freq: Four times a day (QID) | INTRAVENOUS | Status: DC
  Administered 2012-04-01 – 2012-04-04 (×10): 1000 mg via INTRAVENOUS
  Filled 2012-04-01 (×17): qty 200

## 2012-04-01 MED ORDER — VANCOMYCIN HCL IN DEXTROSE 1-5 GM/200ML-% IV SOLN
1000.0000 mg | Freq: Once | INTRAVENOUS | Status: AC
  Administered 2012-04-01: 1000 mg via INTRAVENOUS
  Filled 2012-04-01: qty 200

## 2012-04-01 MED ORDER — DIPHENHYDRAMINE HCL 25 MG PO CAPS
ORAL_CAPSULE | ORAL | Status: AC
  Administered 2012-04-01: 25 mg
  Filled 2012-04-01: qty 1

## 2012-04-01 MED ORDER — HYDROCODONE-ACETAMINOPHEN 5-325 MG PO TABS
1.0000 | ORAL_TABLET | ORAL | Status: DC | PRN
  Administered 2012-04-01: 1 via ORAL
  Administered 2012-04-01 – 2012-04-03 (×6): 2 via ORAL
  Administered 2012-04-03: 1 via ORAL
  Administered 2012-04-03 – 2012-04-04 (×3): 2 via ORAL
  Filled 2012-04-01 (×6): qty 2
  Filled 2012-04-01: qty 1
  Filled 2012-04-01: qty 2
  Filled 2012-04-01: qty 1
  Filled 2012-04-01 (×2): qty 2

## 2012-04-01 MED ORDER — ONDANSETRON HCL 4 MG/2ML IJ SOLN: 4.0000 mg | Freq: Four times a day (QID) | INTRAMUSCULAR | Status: DC | PRN

## 2012-04-01 MED ORDER — INSULIN ASPART 100 UNIT/ML ~~LOC~~ SOLN: 0.0000 [IU] | Freq: Every day | SUBCUTANEOUS | Status: DC

## 2012-04-01 MED ORDER — SODIUM CHLORIDE 0.9 % IV SOLN
Freq: Once | INTRAVENOUS | Status: AC
  Administered 2012-04-01: 14:00:00 via INTRAVENOUS

## 2012-04-01 MED ORDER — ACETAMINOPHEN 650 MG RE SUPP: 650.0000 mg | Freq: Four times a day (QID) | RECTAL | Status: DC | PRN

## 2012-04-01 MED ORDER — SODIUM CHLORIDE 0.9 % IV SOLN
INTRAVENOUS | Status: DC
  Administered 2012-04-03: via INTRAVENOUS

## 2012-04-01 MED ORDER — ENOXAPARIN SODIUM 100 MG/ML ~~LOC~~ SOLN
90.0000 mg | SUBCUTANEOUS | Status: DC
  Administered 2012-04-02 – 2012-04-03 (×2): 90 mg via SUBCUTANEOUS
  Filled 2012-04-01 (×5): qty 1

## 2012-04-01 MED ORDER — AMLODIPINE BESYLATE 5 MG PO TABS
10.0000 mg | ORAL_TABLET | Freq: Every day | ORAL | Status: DC
  Administered 2012-04-01 – 2012-04-04 (×4): 10 mg via ORAL
  Filled 2012-04-01: qty 2
  Filled 2012-04-01: qty 1
  Filled 2012-04-01: qty 2
  Filled 2012-04-01: qty 1
  Filled 2012-04-01: qty 2

## 2012-04-01 MED ORDER — INSULIN ASPART 100 UNIT/ML ~~LOC~~ SOLN
0.0000 [IU] | Freq: Three times a day (TID) | SUBCUTANEOUS | Status: DC
  Administered 2012-04-02: 3 [IU] via SUBCUTANEOUS
  Administered 2012-04-02: 2 [IU] via SUBCUTANEOUS
  Administered 2012-04-03: 3 [IU] via SUBCUTANEOUS
  Administered 2012-04-03: 2 [IU] via SUBCUTANEOUS

## 2012-04-01 MED ORDER — ACETAMINOPHEN 325 MG PO TABS: 650.0000 mg | ORAL_TABLET | Freq: Four times a day (QID) | ORAL | Status: DC | PRN

## 2012-04-01 MED ORDER — MONTELUKAST SODIUM 10 MG PO TABS
10.0000 mg | ORAL_TABLET | Freq: Every day | ORAL | Status: DC
  Administered 2012-04-01 – 2012-04-03 (×3): 10 mg via ORAL
  Filled 2012-04-01 (×3): qty 1

## 2012-04-01 MED ORDER — PNEUMOCOCCAL VAC POLYVALENT 25 MCG/0.5ML IJ INJ
0.5000 mL | INJECTION | INTRAMUSCULAR | Status: AC
  Administered 2012-04-02: 0.5 mL via INTRAMUSCULAR
  Filled 2012-04-01: qty 0.5

## 2012-04-01 MED ORDER — ONDANSETRON HCL 4 MG PO TABS: 4.0000 mg | ORAL_TABLET | Freq: Four times a day (QID) | ORAL | Status: DC | PRN

## 2012-04-01 MED ORDER — DIPHENHYDRAMINE HCL 25 MG PO CAPS
25.0000 mg | ORAL_CAPSULE | Freq: Four times a day (QID) | ORAL | Status: DC | PRN
  Administered 2012-04-02 – 2012-04-03 (×2): 25 mg via ORAL
  Filled 2012-04-01 (×2): qty 1

## 2012-04-01 MED ORDER — ALBUTEROL SULFATE (5 MG/ML) 0.5% IN NEBU: 2.5000 mg | INHALATION_SOLUTION | Freq: Four times a day (QID) | RESPIRATORY_TRACT | Status: DC | PRN

## 2012-04-01 MED ORDER — TETRACAINE HCL 0.5 % OP SOLN
2.0000 [drp] | Freq: Once | OPHTHALMIC | Status: AC
  Administered 2012-04-01: 2 [drp] via OPHTHALMIC
  Filled 2012-04-01: qty 2

## 2012-04-01 MED ORDER — VANCOMYCIN HCL IN DEXTROSE 1-5 GM/200ML-% IV SOLN
INTRAVENOUS | Status: AC
  Filled 2012-04-01: qty 400

## 2012-04-01 MED ORDER — FLUTICASONE-SALMETEROL 100-50 MCG/DOSE IN AEPB
1.0000 | INHALATION_SPRAY | Freq: Two times a day (BID) | RESPIRATORY_TRACT | Status: DC
  Administered 2012-04-01 – 2012-04-04 (×6): 1 via RESPIRATORY_TRACT
  Filled 2012-04-01: qty 14

## 2012-04-01 MED ORDER — FLUTICASONE-SALMETEROL 100-50 MCG/DOSE IN AEPB
INHALATION_SPRAY | RESPIRATORY_TRACT | Status: AC
  Filled 2012-04-01: qty 14

## 2012-04-01 NOTE — H&P (Addendum)
PCP:   Sherrie George, PA-C   Chief Complaint:  Right eye swelling  HPI: This is a very pleasant 41 year old female who presents to the emergency room with complaints of right eye swelling. She reports that approximately 2 days ago she noticed that her right eye was itching. She did scratch it. The following morning when she woke up, she noticed that her right eye was swollen. She noticed some clear serous discharge coming from her eye. She denies any frank pus. She reports her vision is slightly blurry. She also does have some discomfort when looking in certain directions. She's not sure whether she's had a fever. She denies any recent upper respiratory tract infection. She does report having allergies and had initially thought that the itching in her eyelids was due to allergies. She was evaluated in the emergency room with a CT scan which revealed periorbital cellulitis without any definite abscess. She's been referred for admission.  Allergies:   Allergies  Allergen Reactions  . Shrimp (Shellfish Allergy) Nausea And Vomiting      Past Medical History  Diagnosis Date  . Asthma   . Hypertension   . Diabetes mellitus     Past Surgical History  Procedure Date  . Cesarean section   . Cholecystectomy   . Tonsillectomy   . Carpal tunnel release   . Dilation and curettage of uterus     Prior to Admission medications   Medication Sig Start Date End Date Taking? Authorizing Provider  albuterol (PROVENTIL) (2.5 MG/3ML) 0.083% nebulizer solution Take 2.5 mg by nebulization every 6 (six) hours as needed.     Yes Historical Provider, MD  amLODipine (NORVASC) 10 MG tablet Take 10 mg by mouth daily.     Yes Historical Provider, MD  cetirizine (ZYRTEC) 10 MG tablet Take 10 mg by mouth daily.     Yes Historical Provider, MD  FLUoxetine (PROZAC) 20 MG capsule Take 20 mg by mouth daily.     Yes Historical Provider, MD  Fluticasone-Salmeterol (ADVAIR) 100-50 MCG/DOSE AEPB Inhale 1 puff into  the lungs every 12 (twelve) hours.     Yes Historical Provider, MD  HYDROcodone-acetaminophen (VICODIN) 5-500 MG per tablet Take 1 tablet by mouth every 6 (six) hours as needed. pain    Yes Historical Provider, MD  metFORMIN (GLUMETZA) 500 MG (MOD) 24 hr tablet Take 500 mg by mouth daily with breakfast.     Yes Historical Provider, MD  montelukast (SINGULAIR) 10 MG tablet Take 10 mg by mouth at bedtime.     Yes Historical Provider, MD  omeprazole (PRILOSEC) 10 MG capsule Take 10 mg by mouth daily.     Yes Historical Provider, MD    Social History:  reports that she has never smoked. She does not have any smokeless tobacco history on file. She reports that she does not drink alcohol. Her drug history not on file.  History reviewed. No pertinent family history.  Review of Systems: Positives in bold Constitutional: Denies fever, chills, diaphoresis, appetite change and fatigue.  HEENT: Denies photophobia, eye pain, redness, hearing loss, ear pain, congestion, sore throat, rhinorrhea, sneezing, mouth sores, trouble swallowing, neck pain, neck stiffness and tinnitus.   Respiratory: Denies SOB, DOE, cough, chest tightness,  and wheezing.   Cardiovascular: Denies chest pain, palpitations and leg swelling.  Gastrointestinal: Denies nausea, vomiting, abdominal pain, diarrhea, constipation, blood in stool and abdominal distention.  Genitourinary: Denies dysuria, urgency, frequency, hematuria, flank pain and difficulty urinating.  Musculoskeletal: Denies myalgias, back  pain, joint swelling, arthralgias and gait problem.  Skin: Denies pallor, rash and wound.  Neurological: Denies dizziness, seizures, syncope, weakness, light-headedness, numbness and headaches.  Hematological: Denies adenopathy. Easy bruising, personal or family bleeding history  Psychiatric/Behavioral: Denies suicidal ideation, mood changes, confusion, nervousness, sleep disturbance and agitation   Physical Exam: Blood pressure  130/81, pulse 100, temperature 98.7 F (37.1 C), temperature source Oral, resp. rate 20, height 5' 7.5" (1.715 m), weight 187.789 kg (414 lb), last menstrual period 03/30/2012, SpO2 94.00%. General: Patient is lying in bed, does not appear to be any acute distress, alert and oriented x3 HEENT: Normocephalic, atraumatic, left eyes normal, on the right side her eyelids are swollen, there is clear serous discharge noted in the medial canthus and crusting on the eyelids, she has some conjunctivitis medially, pupils are both equal, round, reactive to light skin feels warm, mildly erythematous, mildly tender to touch  Neck: Supple Chest: Clear to auscultation bilaterally Cardiac: S1, S2, regular rate and rhythm Abdomen: Soft, nontender, nondistended, bowel sounds are active Extremities: No edema cyanosis or clubbing Neurologic: Grossly intact, nonfocal Skin: Intact, no visible rashes.    Labs on Admission:  Results for orders placed during the hospital encounter of 04/01/12 (from the past 48 hour(s))  GLUCOSE, CAPILLARY     Status: Abnormal   Collection Time   04/01/12 10:20 AM      Component Value Range Comment   Glucose-Capillary 142 (*) 70 - 99 (mg/dL)   POCT I-STAT, CHEM 8     Status: Abnormal   Collection Time   04/01/12 12:01 PM      Component Value Range Comment   Sodium 137  135 - 145 (mEq/L)    Potassium 3.6  3.5 - 5.1 (mEq/L)    Chloride 107  96 - 112 (mEq/L)    BUN 8  6 - 23 (mg/dL)    Creatinine, Ser 0.34  0.50 - 1.10 (mg/dL)    Glucose, Bld 742 (*) 70 - 99 (mg/dL)    Calcium, Ion 5.95  1.12 - 1.32 (mmol/L)    TCO2 20  0 - 100 (mmol/L)    Hemoglobin 13.3  12.0 - 15.0 (g/dL)    HCT 63.8  75.6 - 43.3 (%)   CBC     Status: Abnormal   Collection Time   04/01/12  1:27 PM      Component Value Range Comment   WBC 9.2  4.0 - 10.5 (K/uL)    RBC 5.07  3.87 - 5.11 (MIL/uL)    Hemoglobin 12.0  12.0 - 15.0 (g/dL)    HCT 29.5  18.8 - 41.6 (%)    MCV 74.0 (*) 78.0 - 100.0 (fL)    MCH  23.7 (*) 26.0 - 34.0 (pg)    MCHC 32.0  30.0 - 36.0 (g/dL)    RDW 60.6 (*) 30.1 - 15.5 (%)    Platelets 394  150 - 400 (K/uL)   DIFFERENTIAL     Status: Normal   Collection Time   04/01/12  1:27 PM      Component Value Range Comment   Neutrophils Relative 61  43 - 77 (%)    Neutro Abs 5.6  1.7 - 7.7 (K/uL)    Lymphocytes Relative 29  12 - 46 (%)    Lymphs Abs 2.6  0.7 - 4.0 (K/uL)    Monocytes Relative 7  3 - 12 (%)    Monocytes Absolute 0.7  0.1 - 1.0 (K/uL)    Eosinophils Relative 2  0 - 5 (%)    Eosinophils Absolute 0.2  0.0 - 0.7 (K/uL)    Basophils Relative 1  0 - 1 (%)    Basophils Absolute 0.1  0.0 - 0.1 (K/uL)     Radiological Exams on Admission: Ct Maxillofacial W/cm  04/01/2012  *RADIOLOGY REPORT*  Clinical Data: Right eye pain, swelling, burning, redness, retro- orbital pressure, right-sided headache, history diabetes  CT MAXILLOFACIAL WITH CONTRAST  Technique:  Multidetector CT imaging of the maxillofacial structures was performed with intravenous contrast. Multiplanar CT image reconstructions were also generated. Right side of face marked with a BB.  Contrast: 75mL OMNIPAQUE IOHEXOL 300 MG/ML SOLN  Comparison: None  Findings: Beam hardening artifacts secondary to body habitus/shoulders and dental amalgam. Right periorbital soft tissue swelling and infiltration with overlying skin thickening. Intraconal fat planes clear. No enhancing fluid collection identified to suggest abscess. However, there is slight haziness of the post septal fat planes at the more superior portions the medial right orbit suggesting edema. Remaining facial soft tissues unremarkable. Small mucosal retention cyst within the right maxillary sinus. Nasal septum midline. Remaining paranasal sinuses, visualized mastoid air cells and middle ear cavities clear. No fracture or bone destruction.  IMPRESSION: Right periorbital soft tissue swelling/edema compatible with periorbital cellulitis. No definite evidence of  discrete intraorbital abscess though there is slight hazy infiltration of post septal fat planes at the more superior medial portions of the right orbit; recommend close clinical follow-up and follow-up imaging as clinically indicated.  Original Report Authenticated By: Lollie Marrow, M.D.    Assessment/Plan Principal Problem:  *Periorbital cellulitis, right Active Problems:  Morbid obesity  HTN (hypertension)  DM (diabetes mellitus)  Allergic rhinitis  Plan:  Patient will be admitted for IV antibiotic therapy. We will start her on broad coverage with vancomycin and Zosyn, this can be narrowed as her condition improves. We will have to watch her closely for the next 24-48 hours. We will continue the remainder of her outpatient medications. She'll receive sliding scale insulin for her diabetes. We will hold metformin since she recently received contrast. Further orders per the clinical course.  Time Spent on Admission:  MEMON,JEHANZEB [facility] Pager: [phone] 04/01/2012, 5:59 PM    Pt started itching profusely with infusion of both vanc and zosyn, second dose of vanc but first dose of zosyn.  No rash.  Stop zosyn, give benadryl.  Cont vanc for now.   Examined pt around 1130p broke out in hives earlier given benadryl and now hives almost all gone.  No throat swelling or resp issues.  Now last with vanc infusing without any worsening symptoms.  Think true allergic reaction to zosyn.  No previous allergic reactions to abx in past and has taken pcn in multiple forms before.  Will cont vanc and monitor closely.  Add zosyn to allergy list for now.

## 2012-04-01 NOTE — ED Provider Notes (Signed)
History     CSN: [id number]  Arrival date & time 04/01/12  4098   First MD Initiated Contact with Patient 04/01/12 [id number]      Chief Complaint  Patient presents with  . Eye Pain  . Facial Swelling    (Consider location/radiation/quality/duration/timing/severity/associated sxs/prior treatment) HPI Comments: Patient c/o pain and swelling surrounding the right eye. Pain began two days ago and swelling began yesterday.  Pain to the eye is worse with eye movement.  Also describes a burning sensation of the eye.  She also reports hx of chronic dental caries for approximately three years.  She denies fever, neck pain, vomiting or headaches.  Patient has hx of diabetes that is controlled with metformin  Patient is a 41 y.o. female presenting with eye pain. The history is provided by the patient.  Eye Pain This is a new problem. The current episode started in the past 7 days. The problem occurs constantly. The problem has been gradually worsening. Associated symptoms include a visual change. Pertinent negatives include no fever, headaches, nausea, neck pain, numbness, rash, sore throat, swollen glands, vomiting or weakness. Exacerbated by: eye movement and blinking. Treatments tried: cool compresses. The treatment provided no relief.    Past Medical History  Diagnosis Date  . Asthma   . Hypertension   . Diabetes mellitus     Past Surgical History  Procedure Date  . Cesarean section   . Cholecystectomy   . Tonsillectomy   . Carpal tunnel release   . Dilation and curettage of uterus     History reviewed. No pertinent family history.  History  Substance Use Topics  . Smoking status: Never Smoker   . Smokeless tobacco: Not on file  . Alcohol Use: No    OB History    Grav Para Term Preterm Abortions TAB SAB Ect Mult Living                  Review of Systems  Constitutional: Negative for fever, activity change and appetite change.  HENT: Negative for sore throat, facial  swelling and neck pain.   Eyes: Positive for photophobia, pain, redness and visual disturbance. Negative for discharge and itching.  Respiratory: Negative for chest tightness.   Gastrointestinal: Negative for nausea and vomiting.  Skin: Negative for rash.  Neurological: Negative for dizziness, weakness, numbness and headaches.  All other systems reviewed and are negative.    Allergies  Shrimp  Home Medications   Current Outpatient Rx  Name Route Sig Dispense Refill  . ALBUTEROL SULFATE (2.5 MG/3ML) 0.083% IN NEBU Nebulization Take 2.5 mg by nebulization every 6 (six) hours as needed.      Marland Kitchen AMLODIPINE BESYLATE 10 MG PO TABS Oral Take 10 mg by mouth daily.      Marland Kitchen CETIRIZINE HCL 10 MG PO TABS Oral Take 10 mg by mouth daily.      Marland Kitchen FLUOXETINE HCL 20 MG PO CAPS Oral Take 20 mg by mouth daily.      Marland Kitchen FLUTICASONE-SALMETEROL 100-50 MCG/DOSE IN AEPB Inhalation Inhale 1 puff into the lungs every 12 (twelve) hours.      Marland Kitchen HYDROCODONE-ACETAMINOPHEN 5-500 MG PO TABS Oral Take 1 tablet by mouth every 6 (six) hours as needed. pain     . METFORMIN HCL ER (MOD) 500 MG PO TB24 Oral Take 500 mg by mouth daily with breakfast.      . MONTELUKAST SODIUM 10 MG PO TABS Oral Take 10 mg by mouth at bedtime.      Marland Kitchen  OMEPRAZOLE 10 MG PO CPDR Oral Take 10 mg by mouth daily.        BP 137/91  Pulse 106  Temp(Src) 98.9 F (37.2 C) (Oral)  Ht 5' 7.5" (1.715 m)  Wt 414 lb (187.789 kg)  BMI 63.88 kg/m2  SpO2 94%  LMP 03/30/2012  Physical Exam  Nursing note and vitals reviewed. Constitutional: She is oriented to person, place, and time. She appears well-developed and well-nourished. No distress.       Morbidly obese  HENT:  Head: Normocephalic and atraumatic.  Mouth/Throat: Oropharynx is clear and moist.       Dental caries of the right lower molars.  Eyes: EOM are normal. Pupils are equal, round, and reactive to light. Right eye exhibits no discharge, no exudate and no hordeolum. No foreign body present  in the right eye. Left eye exhibits no discharge. Right conjunctiva is injected. Right eye exhibits no nystagmus.       Moderate STS surrounding the right orbit.  Mild erythema also present.    Neck: Normal range of motion. Neck supple.  Cardiovascular: Normal rate, regular rhythm and normal heart sounds.   Pulmonary/Chest: Effort normal and breath sounds normal. No respiratory distress.  Musculoskeletal: Normal range of motion. She exhibits no tenderness.  Lymphadenopathy:    She has no cervical adenopathy.  Neurological: She is alert and oriented to person, place, and time. She exhibits normal muscle tone. Coordination normal.  Skin: Skin is warm and dry.    ED Course  Procedures (including critical care time)  Results for orders placed during the hospital encounter of 04/01/12  GLUCOSE, CAPILLARY      Component Value Range   Glucose-Capillary 142 (*) 70 - 99 (mg/dL)  POCT I-STAT, CHEM 8      Component Value Range   Sodium 137  135 - 145 (mEq/L)   Potassium 3.6  3.5 - 5.1 (mEq/L)   Chloride 107  96 - 112 (mEq/L)   BUN 8  6 - 23 (mg/dL)   Creatinine, Ser 1.61  0.50 - 1.10 (mg/dL)   Glucose, Bld 096 (*) 70 - 99 (mg/dL)   Calcium, Ion 0.45  4.09 - 1.32 (mmol/L)   TCO2 20  0 - 100 (mmol/L)   Hemoglobin 13.3  12.0 - 15.0 (g/dL)   HCT 81.1  91.4 - 78.2 (%)  CBC      Component Value Range   WBC 9.2  4.0 - 10.5 (K/uL)   RBC 5.07  3.87 - 5.11 (MIL/uL)   Hemoglobin 12.0  12.0 - 15.0 (g/dL)   HCT 95.6  21.3 - 08.6 (%)   MCV 74.0 (*) 78.0 - 100.0 (fL)   MCH 23.7 (*) 26.0 - 34.0 (pg)   MCHC 32.0  30.0 - 36.0 (g/dL)   RDW 57.8 (*) 46.9 - 15.5 (%)   Platelets 394  150 - 400 (K/uL)  DIFFERENTIAL      Component Value Range   Neutrophils Relative 61  43 - 77 (%)   Neutro Abs 5.6  1.7 - 7.7 (K/uL)   Lymphocytes Relative 29  12 - 46 (%)   Lymphs Abs 2.6  0.7 - 4.0 (K/uL)   Monocytes Relative 7  3 - 12 (%)   Monocytes Absolute 0.7  0.1 - 1.0 (K/uL)   Eosinophils Relative 2  0 - 5 (%)     Eosinophils Absolute 0.2  0.0 - 0.7 (K/uL)   Basophils Relative 1  0 - 1 (%)   Basophils Absolute 0.1  0.0 - 0.1 (K/uL)     Ct Maxillofacial W/cm  04/01/2012  *RADIOLOGY REPORT*  Clinical Data: Right eye pain, swelling, burning, redness, retro- orbital pressure, right-sided headache, history diabetes  CT MAXILLOFACIAL WITH CONTRAST  Technique:  Multidetector CT imaging of the maxillofacial structures was performed with intravenous contrast. Multiplanar CT image reconstructions were also generated. Right side of face marked with a BB.  Contrast: 75mL OMNIPAQUE IOHEXOL 300 MG/ML SOLN  Comparison: None  Findings: Beam hardening artifacts secondary to body habitus/shoulders and dental amalgam. Right periorbital soft tissue swelling and infiltration with overlying skin thickening. Intraconal fat planes clear. No enhancing fluid collection identified to suggest abscess. However, there is slight haziness of the post septal fat planes at the more superior portions the medial right orbit suggesting edema. Remaining facial soft tissues unremarkable. Small mucosal retention cyst within the right maxillary sinus. Nasal septum midline. Remaining paranasal sinuses, visualized mastoid air cells and middle ear cavities clear. No fracture or bone destruction.  IMPRESSION: Right periorbital soft tissue swelling/edema compatible with periorbital cellulitis. No definite evidence of discrete intraorbital abscess though there is slight hazy infiltration of post septal fat planes at the more superior medial portions of the right orbit; recommend close clinical follow-up and follow-up imaging as clinically indicated.  Original Report Authenticated By: Lollie Marrow, M.D.        MDM    Previous medical charts, nursing notes and vitals signs from this visit were reviewed by me   All laboratory results and/or imaging results performed on this visit, if applicable, were reviewed by me and discussed with the patient and/or  parent as well as recommendation for follow-up    MEDICATIONS GIVEN IN ED:  Percocet po, Vancomycin IV    Patient is stable at this time.  Right periorbital cellulitis and hx of DM. I have discussed pt hx, exam findings and care plan with EDP.   Will consult for admission   Consulted Dr. Kerry Hough will admit .              Tijah Hane L. Bianney [place], [place] 04/01/12 1359

## 2012-04-01 NOTE — Progress Notes (Signed)
ANTIBIOTIC CONSULT NOTE - INITIAL  Pharmacy Consult for Vancomycin and Zosyn  Indication: orbital cellulitis   Allergies  Allergen Reactions  . Shrimp (Shellfish Allergy) Nausea And Vomiting    Patient Measurements: Height: 5' 7.5" (171.5 cm) Weight: 414 lb (187.789 kg) IBW/kg (Calculated) : 62.75   Vital Signs: Temp: 98.7 F (37.1 C) (05/23 1500) Temp src: Oral (05/23 1500) BP: 130/81 mmHg (05/23 1500) Pulse Rate: 100  (05/23 1500) Intake/Output from previous day:   Intake/Output from this shift:    Labs:  Basename 04/01/12 1327 04/01/12 1201  WBC 9.2 --  HGB 12.0 13.3  PLT 394 --  LABCREA -- --  CREATININE -- 0.80   Estimated Creatinine Clearance: 164.8 ml/min (by C-G formula based on Cr of 0.8). No results found for this basename: VANCOTROUGH:2,VANCOPEAK:2,VANCORANDOM:2,GENTTROUGH:2,GENTPEAK:2,GENTRANDOM:2,TOBRATROUGH:2,TOBRAPEAK:2,TOBRARND:2,AMIKACINPEAK:2,AMIKACINTROU:2,AMIKACIN:2, in the last 72 hours   Microbiology: No results found for this or any previous visit (from the past 720 hour(s)).  Medical History: Past Medical History  Diagnosis Date  . Asthma   . Hypertension   . Diabetes mellitus     Medications:  Prescriptions prior to admission  Medication Sig Dispense Refill  . albuterol (PROVENTIL) (2.5 MG/3ML) 0.083% nebulizer solution Take 2.5 mg by nebulization every 6 (six) hours as needed.        Marland Kitchen amLODipine (NORVASC) 10 MG tablet Take 10 mg by mouth daily.        . cetirizine (ZYRTEC) 10 MG tablet Take 10 mg by mouth daily.        Marland Kitchen FLUoxetine (PROZAC) 20 MG capsule Take 20 mg by mouth daily.        . Fluticasone-Salmeterol (ADVAIR) 100-50 MCG/DOSE AEPB Inhale 1 puff into the lungs every 12 (twelve) hours.        Marland Kitchen HYDROcodone-acetaminophen (VICODIN) 5-500 MG per tablet Take 1 tablet by mouth every 6 (six) hours as needed. pain       . metFORMIN (GLUMETZA) 500 MG (MOD) 24 hr tablet Take 500 mg by mouth daily with breakfast.        .  montelukast (SINGULAIR) 10 MG tablet Take 10 mg by mouth at bedtime.        Marland Kitchen omeprazole (PRILOSEC) 10 MG capsule Take 10 mg by mouth daily.         Assessment: Okay for Protocol Estimated Creatinine Clearance: 164.8 ml/min (by C-G formula based on Cr of 0.8). Morbidly obese female received 1gm IV Vancomycin in ED @13 :41.  Goal of Therapy:  Vancomycin trough level 10-15 mcg/ml  Plan:  Zosyn 3.375gm IV every 8 hours. Follow-up micro data, labs, vitals. Vancomycin 1000mg  IV every 6 hours. Measure antibiotic drug levels at steady state Follow up culture results  Mady Gemma 04/01/2012,6:36 PM

## 2012-04-01 NOTE — Progress Notes (Signed)
Venipuncture performed for Creatnine level 

## 2012-04-01 NOTE — Progress Notes (Signed)
Started patient antibiotics per MD order.   After 10-15 minutes, patient called nurse to room with severe itching.  Notified Dr. Onalee Hua of patient's itching.  Returned to room to check on patient, and patient had broken out in hives.  Benadryl given per md order.  Reassessed patient one hour later and itching had subsided and hives had mildly decreased.  Will continue to monitor patient.  Patient told to notify nurse if itching persisted or worsened, or had any other reaction type symptons.

## 2012-04-01 NOTE — ED Notes (Signed)
Pt c/o rt eye swelling and pain. Pt states Tuesday the eye started itching and then progressed to a burning sensation.

## 2012-04-01 NOTE — ED Provider Notes (Signed)
Medical screening examination/treatment/procedure(s) were conducted as a shared visit with non-physician practitioner(s) and myself.  I personally evaluated the patient during the encounter.  CT scan shows right periorbital cellulitis..  IV vancomycin. Admit.  Donnetta Hutching, MD 04/01/12 [phone]

## 2012-04-02 DIAGNOSIS — L03211 Cellulitis of face: Secondary | ICD-10-CM

## 2012-04-02 DIAGNOSIS — IMO0001 Reserved for inherently not codable concepts without codable children: Secondary | ICD-10-CM

## 2012-04-02 DIAGNOSIS — L0201 Cutaneous abscess of face: Secondary | ICD-10-CM

## 2012-04-02 DIAGNOSIS — E1165 Type 2 diabetes mellitus with hyperglycemia: Secondary | ICD-10-CM

## 2012-04-02 DIAGNOSIS — I10 Essential (primary) hypertension: Secondary | ICD-10-CM

## 2012-04-02 LAB — BASIC METABOLIC PANEL
BUN: 5 mg/dL — ABNORMAL LOW (ref 6–23)
Calcium: 8.8 mg/dL (ref 8.4–10.5)
GFR calc Af Amer: >90 mL/min (ref >90)
GFR calc non Af Amer: >90 mL/min (ref >90)
Glucose, Bld: 139 mg/dL — ABNORMAL HIGH (ref 70–99)
Potassium: 3.4 mEq/L — ABNORMAL LOW (ref 3.5–5.1)
Sodium: 136 mEq/L (ref 135–145)

## 2012-04-02 LAB — CBC
Hemoglobin: 11.1 g/dL — ABNORMAL LOW (ref 12.0–15.0)
MCH: 23.5 pg — ABNORMAL LOW (ref 26.0–34.0)
MCHC: 31.5 g/dL (ref 30.0–36.0)
Platelets: 408 10*3/uL — ABNORMAL HIGH (ref 150–400)

## 2012-04-02 LAB — GLUCOSE, CAPILLARY
Glucose-Capillary: 152 mg/dL — ABNORMAL HIGH (ref 70–99)
Glucose-Capillary: 144 mg/dL — ABNORMAL HIGH (ref 70–99)

## 2012-04-02 LAB — HEMOGLOBIN A1C
Hgb A1c MFr Bld: 6.9 % — ABNORMAL HIGH (ref <5.7)
Mean Plasma Glucose: 151 mg/dL — ABNORMAL HIGH (ref <117)

## 2012-04-02 MED ORDER — LEVOFLOXACIN IN D5W 750 MG/150ML IV SOLN
750.0000 mg | INTRAVENOUS | Status: DC
  Administered 2012-04-02 – 2012-04-03 (×2): 750 mg via INTRAVENOUS
  Filled 2012-04-02 (×4): qty 150

## 2012-04-02 MED ORDER — SODIUM CHLORIDE 0.9 % IJ SOLN
INTRAMUSCULAR | Status: AC
  Administered 2012-04-02: 18:00:00
  Filled 2012-04-02: qty 9

## 2012-04-02 MED ORDER — POTASSIUM CHLORIDE CRYS ER 20 MEQ PO TBCR
40.0000 meq | EXTENDED_RELEASE_TABLET | Freq: Once | ORAL | Status: AC
  Administered 2012-04-02: 40 meq via ORAL
  Filled 2012-04-02: qty 2

## 2012-04-02 NOTE — Progress Notes (Signed)
UR chart review completed. Michele Craig  

## 2012-04-02 NOTE — Progress Notes (Signed)
ANTIBIOTIC CONSULT NOTE - INITIAL  Pharmacy Consult for Vancomycin and Zosyn  Indication: orbital cellulitis   Allergies  Allergen Reactions  . Shrimp (Shellfish Allergy) Nausea And Vomiting  . Zosyn (Piperacillin Sod-Tazobactam So) Hives and Itching    Patient Measurements: Height: 5' 7.5" (171.5 cm) Weight: 414 lb (187.789 kg) IBW/kg (Calculated) : 62.75   Vital Signs: Temp: 98 F (36.7 C) (05/24 0539) Temp src: Oral (05/24 0539) BP: 116/77 mmHg (05/24 0539) Pulse Rate: 92  (05/24 0539) Intake/Output from previous day: 05/23 0701 - 05/24 0700 In: 1260 [I.V.:1260] Out: -  Intake/Output from this shift: Total I/O In: 360 [P.O.:360] Out: -   Labs:  Basename 04/02/12 0520 04/01/12 1754 04/01/12 1327 04/01/12 1201  WBC 8.7 -- 9.2 --  HGB 11.1* -- 12.0 13.3  PLT 408* -- 394 --  LABCREA -- -- -- --  CREATININE 0.73 0.73 -- 0.80   Estimated Creatinine Clearance: 164.8 ml/min (by C-G formula based on Cr of 0.73). No results found for this basename: VANCOTROUGH:2,VANCOPEAK:2,VANCORANDOM:2,GENTTROUGH:2,GENTPEAK:2,GENTRANDOM:2,TOBRATROUGH:2,TOBRAPEAK:2,TOBRARND:2,AMIKACINPEAK:2,AMIKACINTROU:2,AMIKACIN:2, in the last 72 hours   Microbiology: No results found for this or any previous visit (from the past 720 hour(s)).  Medical History: Past Medical History  Diagnosis Date  . Asthma   . Hypertension   . Diabetes mellitus     Medications:  Prescriptions prior to admission  Medication Sig Dispense Refill  . albuterol (PROVENTIL) (2.5 MG/3ML) 0.083% nebulizer solution Take 2.5 mg by nebulization every 6 (six) hours as needed.        Marland Kitchen amLODipine (NORVASC) 10 MG tablet Take 10 mg by mouth daily.        . cetirizine (ZYRTEC) 10 MG tablet Take 10 mg by mouth daily.        Marland Kitchen FLUoxetine (PROZAC) 20 MG capsule Take 20 mg by mouth daily.        . Fluticasone-Salmeterol (ADVAIR) 100-50 MCG/DOSE AEPB Inhale 1 puff into the lungs every 12 (twelve) hours.        Marland Kitchen  HYDROcodone-acetaminophen (VICODIN) 5-500 MG per tablet Take 1 tablet by mouth every 6 (six) hours as needed. pain       . metFORMIN (GLUMETZA) 500 MG (MOD) 24 hr tablet Take 500 mg by mouth daily with breakfast.        . montelukast (SINGULAIR) 10 MG tablet Take 10 mg by mouth at bedtime.        Marland Kitchen omeprazole (PRILOSEC) 10 MG capsule Take 10 mg by mouth daily.         Assessment: Pt reportedly had allergic RXN to Zosyn therefore d/c'd Good renal fxn Clinically improved per report Estimated Creatinine Clearance: 164.8 ml/min (by C-G formula based on Cr of 0.73). Morbidly obese female received 1gm IV Vancomycin in ED @13 :41 then placed on 1gm q6hrs  Goal of Therapy:  Vancomycin trough level 10-15 mcg/ml  Plan:  D/C Zosyn Follow-up micro data, labs, vitals. Vancomycin 1000mg  IV every 6 hours. Check Vancomycin trough before next dose today Measure antibiotic drug levels at steady state Follow up culture results  Michele Craig A 04/02/2012,10:18 AM

## 2012-04-02 NOTE — Care Management Note (Unsigned)
    Page 1 of 1   04/02/2012     11:55:34 AM   CARE MANAGEMENT NOTE 04/02/2012  Patient:  Michele Craig, Michele Craig   Account Number:  [id number]  Date Initiated:  04/02/2012  Documentation initiated by:  Rosemary Holms  Subjective/Objective Assessment:   Pt admitted from home with cellulitis of her right eye. She lives with spouse     Action/Plan:   Spoke with pt at bedside. No HH needs identified.   Anticipated DC Date:  04/04/2012   Anticipated DC Plan:  HOME/SELF CARE      DC Planning Services  CM consult      Choice offered to / List presented to:             Status of service:  In process, will continue to follow Medicare Important Message given?   (If response is "NO", the following Medicare IM given date fields will be blank) Date Medicare IM given:   Date Additional Medicare IM given:    Discharge Disposition:    Per UR Regulation:    If discussed at Long Length of Stay Meetings, dates discussed:    Comments:  04/02/12 900 Andry Bogden Leanord Hawking RN BSN CM

## 2012-04-02 NOTE — Progress Notes (Signed)
Subjective: She is feeling better today, she reports less pain with extraocular movements. She did have an allergic reaction to zosyn yesterday.  Objective: Vital signs in last 24 hours: Temp:  [98 F (36.7 C)-98.7 F (37.1 C)] 98 F (36.7 C) (05/24 0539) Pulse Rate:  [92-100] 92  (05/24 0539) Resp:  [20] 20  (05/24 0539) BP: (116-134)/(77-87) 116/77 mmHg (05/24 0539) SpO2:  [94 %-98 %] 98 % (05/24 0729) Weight change:  Last BM Date: 03/31/12  Intake/Output from previous day: 05/23 0701 - 05/24 0700 In: 1260 [I.V.:1260] Out: -  Total I/O In: 360 [P.O.:360] Out: -    Physical Exam: General: Alert, awake, oriented x3, in no acute distress. HEENT: No bruits, no goiter. Edema around right eye persists Heart: Regular rate and rhythm, without murmurs, rubs, gallops. Lungs: Clear to auscultation bilaterally. Abdomen: Soft, nontender, nondistended, positive bowel sounds. Extremities: No clubbing cyanosis or edema with positive pedal pulses. Neuro: Grossly intact, nonfocal.    Lab Results: Basic Metabolic Panel:  Basename 04/02/12 0520 04/01/12 1754 04/01/12 1201  NA 136 -- 137  K 3.4* -- 3.6  CL 102 -- 107  CO2 24 -- --  GLUCOSE 139* -- 161*  BUN 5* -- 8  CREATININE 0.73 0.73 --  CALCIUM 8.8 -- --  MG -- -- --  PHOS -- -- --   Liver Function Tests: No results found for this basename: AST:2,ALT:2,ALKPHOS:2,BILITOT:2,PROT:2,ALBUMIN:2 in the last 72 hours No results found for this basename: LIPASE:2,AMYLASE:2 in the last 72 hours No results found for this basename: AMMONIA:2 in the last 72 hours CBC:  Basename 04/02/12 0520 04/01/12 1327  WBC 8.7 9.2  NEUTROABS -- 5.6  HGB 11.1* 12.0  HCT 35.2* 37.5  MCV 74.4* 74.0*  PLT 408* 394   Cardiac Enzymes: No results found for this basename: CKTOTAL:3,CKMB:3,CKMBINDEX:3,TROPONINI:3 in the last 72 hours BNP: No results found for this basename: PROBNP:3 in the last 72 hours D-Dimer: No results found for this basename:  DDIMER:2 in the last 72 hours CBG:  Basename 04/02/12 0734 04/01/12 2014 04/01/12 1020  GLUCAP 106* 121* 142*   Hemoglobin A1C: No results found for this basename: HGBA1C in the last 72 hours Fasting Lipid Panel: No results found for this basename: CHOL,HDL,LDLCALC,TRIG,CHOLHDL,LDLDIRECT in the last 72 hours Thyroid Function Tests: No results found for this basename: TSH,T4TOTAL,FREET4,T3FREE,THYROIDAB in the last 72 hours Anemia Panel: No results found for this basename: VITAMINB12,FOLATE,FERRITIN,TIBC,IRON,RETICCTPCT in the last 72 hours Coagulation: No results found for this basename: LABPROT:2,INR:2 in the last 72 hours Urine Drug Screen: Drugs of Abuse  No results found for this basename: labopia, cocainscrnur, labbenz, amphetmu, thcu, labbarb    Alcohol Level: No results found for this basename: ETH:2 in the last 72 hours Urinalysis: No results found for this basename: COLORURINE:2,APPERANCEUR:2,LABSPEC:2,PHURINE:2,GLUCOSEU:2,HGBUR:2,BILIRUBINUR:2,KETONESUR:2,PROTEINUR:2,UROBILINOGEN:2,NITRITE:2,LEUKOCYTESUR:2 in the last 72 hours  No results found for this or any previous visit (from the past 240 hour(s)).  Studies/Results: Ct Maxillofacial W/cm  04/01/2012  *RADIOLOGY REPORT*  Clinical Data: Right eye pain, swelling, burning, redness, retro- orbital pressure, right-sided headache, history diabetes  CT MAXILLOFACIAL WITH CONTRAST  Technique:  Multidetector CT imaging of the maxillofacial structures was performed with intravenous contrast. Multiplanar CT image reconstructions were also generated. Right side of face marked with a BB.  Contrast: 75mL OMNIPAQUE IOHEXOL 300 MG/ML SOLN  Comparison: None  Findings: Beam hardening artifacts secondary to body habitus/shoulders and dental amalgam. Right periorbital soft tissue swelling and infiltration with overlying skin thickening. Intraconal fat planes clear. No enhancing fluid collection identified  to suggest abscess. However, there is  slight haziness of the post septal fat planes at the more superior portions the medial right orbit suggesting edema. Remaining facial soft tissues unremarkable. Small mucosal retention cyst within the right maxillary sinus. Nasal septum midline. Remaining paranasal sinuses, visualized mastoid air cells and middle ear cavities clear. No fracture or bone destruction.  IMPRESSION: Right periorbital soft tissue swelling/edema compatible with periorbital cellulitis. No definite evidence of discrete intraorbital abscess though there is slight hazy infiltration of post septal fat planes at the more superior medial portions of the right orbit; recommend close clinical follow-up and follow-up imaging as clinically indicated.  Original Report Authenticated By: Lollie Marrow, M.D.    Medications: Scheduled Meds:   . sodium chloride   Intravenous Once  . sodium chloride   Intravenous STAT  . amLODipine  10 mg Oral Daily  . diphenhydrAMINE      . enoxaparin  90 mg Subcutaneous Q24H  . FLUoxetine  20 mg Oral Daily  . Fluticasone-Salmeterol  1 puff Inhalation Q12H  . insulin aspart  0-15 Units Subcutaneous TID WC  . insulin aspart  0-5 Units Subcutaneous QHS  . loratadine  10 mg Oral Daily  . montelukast  10 mg Oral QHS  . pantoprazole  40 mg Oral Q1200  . pneumococcal 23 valent vaccine  0.5 mL Intramuscular Tomorrow-1000  . vancomycin  1,000 mg Intravenous Once  . vancomycin  1,000 mg Intravenous Q6H  . DISCONTD: enoxaparin  40 mg Subcutaneous Q24H  . DISCONTD: piperacillin-tazobactam (ZOSYN)  IV  3.375 g Intravenous Q8H   Continuous Infusions:   . sodium chloride     PRN Meds:.acetaminophen, acetaminophen, albuterol, diphenhydrAMINE, HYDROcodone-acetaminophen, iohexol, ondansetron (ZOFRAN) IV, ondansetron  Assessment/Plan:  Principal Problem:  *Periorbital cellulitis, right Active Problems:  Morbid obesity  HTN (hypertension)  DM (diabetes mellitus)  Allergic rhinitis  Plan:  Since  patient had an allergic reaction to zosyn, we will substitute this with levaquin Continue IV antibiotics for now As her swelling improves, she can be transitioned to oral abx HTN and DM are stable Anticipate discharge in 1-2 days   LOS: 1 day   Kainan Patty [facility] Pager: [phone] 04/02/2012, 11:06 AM

## 2012-04-03 DIAGNOSIS — L0201 Cutaneous abscess of face: Secondary | ICD-10-CM

## 2012-04-03 DIAGNOSIS — E1165 Type 2 diabetes mellitus with hyperglycemia: Secondary | ICD-10-CM

## 2012-04-03 DIAGNOSIS — I10 Essential (primary) hypertension: Secondary | ICD-10-CM

## 2012-04-03 DIAGNOSIS — IMO0001 Reserved for inherently not codable concepts without codable children: Secondary | ICD-10-CM

## 2012-04-03 DIAGNOSIS — L03211 Cellulitis of face: Secondary | ICD-10-CM

## 2012-04-03 LAB — GLUCOSE, CAPILLARY
Glucose-Capillary: 160 mg/dL — ABNORMAL HIGH (ref 70–99)
Glucose-Capillary: 124 mg/dL — ABNORMAL HIGH (ref 70–99)

## 2012-04-03 MED ORDER — DOCUSATE SODIUM 100 MG PO CAPS
100.0000 mg | ORAL_CAPSULE | Freq: Two times a day (BID) | ORAL | Status: DC
  Administered 2012-04-03 – 2012-04-04 (×2): 100 mg via ORAL
  Filled 2012-04-03 (×3): qty 1

## 2012-04-03 MED ORDER — TETRAHYDROZOLINE HCL 0.05 % OP SOLN
2.0000 [drp] | Freq: Two times a day (BID) | OPHTHALMIC | Status: DC
  Filled 2012-04-03: qty 15

## 2012-04-03 NOTE — Progress Notes (Signed)
ANTIBIOTIC CONSULT NOTE - INITIAL  Pharmacy Consult for Vancomycin and Zosyn  Indication: orbital cellulitis   Allergies  Allergen Reactions  . Shrimp (Shellfish Allergy) Nausea And Vomiting  . Zosyn (Piperacillin Sod-Tazobactam So) Hives and Itching    Patient Measurements: Height: 5' 7.5" (171.5 cm) Weight: 414 lb (187.789 kg) IBW/kg (Calculated) : 62.75   Vital Signs: Temp: 97.8 F (36.6 C) (05/25 0606) BP: 132/82 mmHg (05/25 0606) Pulse Rate: 83  (05/25 0606) Intake/Output from previous day: 05/24 0701 - 05/25 0700 In: 1883 [P.O.:1080; I.V.:603; IV Piggyback:200] Out: -  Intake/Output from this shift:    Labs:  Basename 04/02/12 0520 04/01/12 1754 04/01/12 1327 04/01/12 1201  WBC 8.7 -- 9.2 --  HGB 11.1* -- 12.0 13.3  PLT 408* -- 394 --  LABCREA -- -- -- --  CREATININE 0.73 0.73 -- 0.80   Estimated Creatinine Clearance: 164.8 ml/min (by C-G formula based on Cr of 0.73).  Basename 04/02/12 1213  VANCOTROUGH 16.9  VANCOPEAK --  VANCORANDOM --  GENTTROUGH --  GENTPEAK --  GENTRANDOM --  TOBRATROUGH --  TOBRAPEAK --  TOBRARND --  AMIKACINPEAK --  AMIKACINTROU --  AMIKACIN --     Microbiology: No results found for this or any previous visit (from the past 720 hour(s)).  Medical History: Past Medical History  Diagnosis Date  . Asthma   . Hypertension   . Diabetes mellitus     Medications:  Prescriptions prior to admission  Medication Sig Dispense Refill  . albuterol (PROVENTIL) (2.5 MG/3ML) 0.083% nebulizer solution Take 2.5 mg by nebulization every 6 (six) hours as needed.        Marland Kitchen amLODipine (NORVASC) 10 MG tablet Take 10 mg by mouth daily.        . cetirizine (ZYRTEC) 10 MG tablet Take 10 mg by mouth daily.        Marland Kitchen FLUoxetine (PROZAC) 20 MG capsule Take 20 mg by mouth daily.        . Fluticasone-Salmeterol (ADVAIR) 100-50 MCG/DOSE AEPB Inhale 1 puff into the lungs every 12 (twelve) hours.        Marland Kitchen HYDROcodone-acetaminophen (VICODIN) 5-500  MG per tablet Take 1 tablet by mouth every 6 (six) hours as needed. pain       . metFORMIN (GLUMETZA) 500 MG (MOD) 24 hr tablet Take 500 mg by mouth daily with breakfast.        . montelukast (SINGULAIR) 10 MG tablet Take 10 mg by mouth at bedtime.        Marland Kitchen omeprazole (PRILOSEC) 10 MG capsule Take 10 mg by mouth daily.         Assessment: Pt reportedly had allergic RXN to Zosyn therefore d/c'd Good renal fxn Clinically improved per report Estimated Creatinine Clearance: 164.8 ml/min (by C-G formula based on Cr of 0.73). Morbidly obese female received 1gm IV Vancomycin in ED @13 :41 then placed on 1gm q6hrs Trough level is on target  Goal of Therapy:  Vancomycin trough level 10-15 mcg/ml  Plan:  Follow-up micro data, labs, vitals. Continue Vancomycin 1000mg  IV every 6 hours. Duration of IV ABX per MD  Valrie Hart A 04/03/2012,9:38 AM

## 2012-04-03 NOTE — Progress Notes (Signed)
Paged Dr. Karilyn Cota d/t pt having no BM x 3 days.  States feeling bloated.  Also, pt states R eye feels dry, gritty and itches.  Orders following.

## 2012-04-03 NOTE — Progress Notes (Signed)
Subjective: This lady was admitted with right orbital cellulitis. She is diabetic. According to her, this has improved but her eye is still relatively closed.           Physical Exam: Blood pressure 132/82, pulse 83, temperature 97.8 F (36.6 C), temperature source Oral, resp. rate 20, height 5' 7.5" (1.715 m), weight 187.789 kg (414 lb), last menstrual period 03/30/2012, SpO2 98.00%. She clearly has swelling in the right periorbital area. She is able to see objects, somewhat blurred. The external ocular movements are normal. She is alert and orientated. There is no evidence of clinical toxicity or sepsis.   Investigations:     Basic Metabolic Panel:  Basename 04/02/12 0520 04/01/12 1754 04/01/12 1201  NA 136 -- 137  K 3.4* -- 3.6  CL 102 -- 107  CO2 24 -- --  GLUCOSE 139* -- 161*  BUN 5* -- 8  CREATININE 0.73 0.73 --  CALCIUM 8.8 -- --  MG -- -- --  PHOS -- -- --       CBC:  Basename 04/02/12 0520 04/01/12 1327  WBC 8.7 9.2  NEUTROABS -- 5.6  HGB 11.1* 12.0  HCT 35.2* 37.5  MCV 74.4* 74.0*  PLT 408* 394    Ct Maxillofacial W/cm  04/01/2012  *RADIOLOGY REPORT*  Clinical Data: Right eye pain, swelling, burning, redness, retro- orbital pressure, right-sided headache, history diabetes  CT MAXILLOFACIAL WITH CONTRAST  Technique:  Multidetector CT imaging of the maxillofacial structures was performed with intravenous contrast. Multiplanar CT image reconstructions were also generated. Right side of face marked with a BB.  Contrast: 75mL OMNIPAQUE IOHEXOL 300 MG/ML SOLN  Comparison: None  Findings: Beam hardening artifacts secondary to body habitus/shoulders and dental amalgam. Right periorbital soft tissue swelling and infiltration with overlying skin thickening. Intraconal fat planes clear. No enhancing fluid collection identified to suggest abscess. However, there is slight haziness of the post septal fat planes at the more superior portions the medial right orbit  suggesting edema. Remaining facial soft tissues unremarkable. Small mucosal retention cyst within the right maxillary sinus. Nasal septum midline. Remaining paranasal sinuses, visualized mastoid air cells and middle ear cavities clear. No fracture or bone destruction.  IMPRESSION: Right periorbital soft tissue swelling/edema compatible with periorbital cellulitis. No definite evidence of discrete intraorbital abscess though there is slight hazy infiltration of post septal fat planes at the more superior medial portions of the right orbit; recommend close clinical follow-up and follow-up imaging as clinically indicated.  Original Report Authenticated By: Lollie Marrow, M.D.      Medications:  Scheduled:   . amLODipine  10 mg Oral Daily  . enoxaparin  90 mg Subcutaneous Q24H  . FLUoxetine  20 mg Oral Daily  . Fluticasone-Salmeterol  1 puff Inhalation Q12H  . insulin aspart  0-15 Units Subcutaneous TID WC  . insulin aspart  0-5 Units Subcutaneous QHS  . levofloxacin (LEVAQUIN) IV  750 mg Intravenous Q24H  . loratadine  10 mg Oral Daily  . montelukast  10 mg Oral QHS  . pantoprazole  40 mg Oral Q1200  . pneumococcal 23 valent vaccine  0.5 mL Intramuscular Tomorrow-1000  . potassium chloride  40 mEq Oral Once  . sodium chloride      . vancomycin  1,000 mg Intravenous Q6H  . DISCONTD: piperacillin-tazobactam (ZOSYN)  IV  3.375 g Intravenous Q8H    Impression: 1. Right Orbital cellulitis, improving. 2. Type 2 diabetes mellitus. 3. Hypertension. 4. Obesity.     Plan: 1.  Continue with intravenous Levaquin. 2. Possible discharge home tomorrow if she continues to improve. She is not quite ready today.     LOS: 2 days   Wilson Singer Pager [phone]  04/03/2012, 8:21 AM

## 2012-04-04 DIAGNOSIS — E1165 Type 2 diabetes mellitus with hyperglycemia: Secondary | ICD-10-CM

## 2012-04-04 DIAGNOSIS — L0201 Cutaneous abscess of face: Secondary | ICD-10-CM

## 2012-04-04 DIAGNOSIS — L03211 Cellulitis of face: Secondary | ICD-10-CM

## 2012-04-04 DIAGNOSIS — IMO0001 Reserved for inherently not codable concepts without codable children: Secondary | ICD-10-CM

## 2012-04-04 DIAGNOSIS — I10 Essential (primary) hypertension: Secondary | ICD-10-CM

## 2012-04-04 LAB — CBC
HCT: 33.9 % — ABNORMAL LOW (ref 36.0–46.0)
Hemoglobin: 10.7 g/dL — ABNORMAL LOW (ref 12.0–15.0)
MCH: 23.7 pg — ABNORMAL LOW (ref 26.0–34.0)
MCHC: 31.6 g/dL (ref 30.0–36.0)
MCV: 75.2 fL — ABNORMAL LOW (ref 78.0–100.0)
RBC: 4.51 MIL/uL (ref 3.87–5.11)

## 2012-04-04 LAB — COMPREHENSIVE METABOLIC PANEL
ALT: 10 U/L (ref 0–35)
BUN: 7 mg/dL (ref 6–23)
CO2: 26 mEq/L (ref 19–32)
Calcium: 8.8 mg/dL (ref 8.4–10.5)
Creatinine, Ser: 0.8 mg/dL (ref 0.50–1.10)
GFR calc Af Amer: >90 mL/min (ref >90)
GFR calc non Af Amer: >90 mL/min (ref >90)
Glucose, Bld: 133 mg/dL — ABNORMAL HIGH (ref 70–99)
Sodium: 135 mEq/L (ref 135–145)

## 2012-04-04 LAB — GLUCOSE, CAPILLARY: Glucose-Capillary: 106 mg/dL — ABNORMAL HIGH (ref 70–99)

## 2012-04-04 MED ORDER — LEVOFLOXACIN 750 MG PO TABS: 750.0000 mg | ORAL_TABLET | Freq: Every day | ORAL | Status: AC

## 2012-04-04 MED ORDER — HYDROCODONE-ACETAMINOPHEN 5-500 MG PO TABS: 1.0000 | ORAL_TABLET | Freq: Four times a day (QID) | ORAL | Status: DC | PRN

## 2012-04-04 MED ORDER — TETRAHYDROZOLINE HCL 0.05 % OP SOLN: 2.0000 [drp] | Freq: Two times a day (BID) | OPHTHALMIC | Status: AC

## 2012-04-04 NOTE — Progress Notes (Signed)
Pt, Michele Craig, provided with discharge instructions and prescriptions for pain medication, antibiotic and eye drop.  Pt verbalizes understanding of d/c instructions.  IV discontinued.  Pt tolerated well.

## 2012-04-04 NOTE — Discharge Summary (Signed)
Physician Discharge Summary  Patient ID: Michele Craig MRN: [id number] DOB/AGE: [birth year] 41 y.o. Primary Care Physician:GRAY, Finis Bud, PA-C, PA-C Admit date: 04/01/2012 Discharge date: 04/04/2012    Discharge Diagnoses:  1. Right Orbital cellulitis, clinically improving. 2. Type 2 diabetes mellitus. 3. Hypertension, controlled. 4. Morbid obesity.   Medication List  As of 04/04/2012  8:10 AM   TAKE these medications         albuterol (2.5 MG/3ML) 0.083% nebulizer solution   Commonly known as: PROVENTIL   Take 2.5 mg by nebulization every 6 (six) hours as needed.      amLODipine 10 MG tablet   Commonly known as: NORVASC   Take 10 mg by mouth daily.      cetirizine 10 MG tablet   Commonly known as: ZYRTEC   Take 10 mg by mouth daily.      FLUoxetine 20 MG capsule   Commonly known as: PROZAC   Take 20 mg by mouth daily.      Fluticasone-Salmeterol 100-50 MCG/DOSE Aepb   Commonly known as: ADVAIR   Inhale 1 puff into the lungs every 12 (twelve) hours.      HYDROcodone-acetaminophen 5-500 MG per tablet   Commonly known as: VICODIN   Take 1 tablet by mouth every 6 (six) hours as needed. pain      levofloxacin 750 MG tablet   Commonly known as: LEVAQUIN   Take 1 tablet (750 mg total) by mouth daily.      metFORMIN 500 MG (MOD) 24 hr tablet   Commonly known as: GLUMETZA   Take 500 mg by mouth daily with breakfast.      montelukast 10 MG tablet   Commonly known as: SINGULAIR   Take 10 mg by mouth at bedtime.      omeprazole 10 MG capsule   Commonly known as: PRILOSEC   Take 10 mg by mouth daily.      tetrahydrozoline 0.05 % ophthalmic solution   Place 2 drops into the right eye 2 (two) times daily.            Discharged Condition: Stable and improved.    Consults: None.  Significant Diagnostic Studies: Ct Maxillofacial W/cm  04/01/2012  *RADIOLOGY REPORT*  Clinical Data: Right eye pain, swelling, burning, redness, retro- orbital pressure, right-sided  headache, history diabetes  CT MAXILLOFACIAL WITH CONTRAST  Technique:  Multidetector CT imaging of the maxillofacial structures was performed with intravenous contrast. Multiplanar CT image reconstructions were also generated. Right side of face marked with a BB.  Contrast: 75mL OMNIPAQUE IOHEXOL 300 MG/ML SOLN  Comparison: None  Findings: Beam hardening artifacts secondary to body habitus/shoulders and dental amalgam. Right periorbital soft tissue swelling and infiltration with overlying skin thickening. Intraconal fat planes clear. No enhancing fluid collection identified to suggest abscess. However, there is slight haziness of the post septal fat planes at the more superior portions the medial right orbit suggesting edema. Remaining facial soft tissues unremarkable. Small mucosal retention cyst within the right maxillary sinus. Nasal septum midline. Remaining paranasal sinuses, visualized mastoid air cells and middle ear cavities clear. No fracture or bone destruction.  IMPRESSION: Right periorbital soft tissue swelling/edema compatible with periorbital cellulitis. No definite evidence of discrete intraorbital abscess though there is slight hazy infiltration of post septal fat planes at the more superior medial portions of the right orbit; recommend close clinical follow-up and follow-up imaging as clinically indicated.  Original Report Authenticated By: Lollie Marrow, M.D.    Lab Results:  Basic Metabolic Panel:  Basename 04/04/12 0642 04/02/12 0520  NA 135 136  K 3.9 3.4*  CL 102 102  CO2 26 24  GLUCOSE 133* 139*  BUN 7 5*  CREATININE 0.80 0.73  CALCIUM 8.8 8.8  MG -- --  PHOS -- --   Liver Function Tests:  [facility] 04/04/12 0642  AST 12  ALT 10  ALKPHOS 76  BILITOT 0.1*  PROT 6.5  ALBUMIN 2.9*     CBC:  Basename 04/04/12 0642 04/02/12 0520 04/01/12 1327  WBC 6.9 8.7 --  NEUTROABS -- -- 5.6  HGB 10.7* 11.1* --  HCT 33.9* 35.2* --  MCV 75.2* 74.4* --  PLT 389 408* --         Hospital Course: This very pleasant 41 year old lady presents to the hospital with symptoms of right eye swelling. She was clinically and radiologically found to have right periorbital cellulitis. This was treated appropriately with intravenous antibiotics. Initially she had Zosyn and vancomycin. She did not tolerate Zosyn and seemed to be allergic to this. The antibiotic regimen was then changed to Levaquin and vancomycin. She seemed to tolerate this and the swelling every day has improved and she feels better. She is able to see out of VI, but vision is somewhat blurred at the present time. She has not been febrile or toxic.  Discharge Exam: Blood pressure 138/74, pulse 95, temperature 98.2 F (36.8 C), temperature source Oral, resp. rate 16, height 5' 7.5" (1.715 m), weight 187.789 kg (414 lb), last menstrual period 03/30/2012, SpO2 98.00%. She looks systemically well. She did still has swelling of the right eyelid and the ptosis because of this. However it does appear to be better than yesterday. External ocular movements are normal. She is able to see out of that right eye. Heart sounds are present and normal. Lung fields are clear. She is alert and orientated. There are no focal neurological signs.  Disposition: Home. She will require further 7 day course of Levaquin at a higher dose. She will follow with her primary care physician in the next few days.  Discharge Orders    Future Orders Please Complete By Expires   Diet - low sodium heart healthy      Increase activity slowly           SignedWilson Singer Pager [phone]  04/04/2012, 8:10 AM

## 2012-07-17 ENCOUNTER — Emergency Department (HOSPITAL_COMMUNITY)
Admission: EM | Admit: 2012-07-17 | Discharge: 2012-07-17 | Disposition: A | Discharge status: Home / Self Care | Payer: Self-pay | Attending: Emergency Medicine | Admitting: Emergency Medicine

## 2012-07-17 ENCOUNTER — Encounter (HOSPITAL_COMMUNITY): Payer: Self-pay | Admitting: Emergency Medicine

## 2012-07-17 DIAGNOSIS — Z888 Allergy status to other drugs, medicaments and biological substances status: Secondary | ICD-10-CM | POA: Insufficient documentation

## 2012-07-17 DIAGNOSIS — J45909 Unspecified asthma, uncomplicated: Secondary | ICD-10-CM | POA: Insufficient documentation

## 2012-07-17 DIAGNOSIS — I10 Essential (primary) hypertension: Secondary | ICD-10-CM | POA: Insufficient documentation

## 2012-07-17 DIAGNOSIS — L239 Allergic contact dermatitis, unspecified cause: Secondary | ICD-10-CM

## 2012-07-17 DIAGNOSIS — Z91013 Allergy to seafood: Secondary | ICD-10-CM | POA: Insufficient documentation

## 2012-07-17 DIAGNOSIS — E119 Type 2 diabetes mellitus without complications: Secondary | ICD-10-CM | POA: Insufficient documentation

## 2012-07-17 DIAGNOSIS — L259 Unspecified contact dermatitis, unspecified cause: Secondary | ICD-10-CM | POA: Insufficient documentation

## 2012-07-17 MED ORDER — DIPHENHYDRAMINE HCL 25 MG PO CAPS: 25.0000 mg | ORAL_CAPSULE | Freq: Four times a day (QID) | ORAL | Status: DC | PRN

## 2012-07-17 MED ORDER — FAMOTIDINE 20 MG PO TABS: 20.0000 mg | ORAL_TABLET | Freq: Two times a day (BID) | ORAL | Status: DC

## 2012-07-17 MED ORDER — PREDNISONE 20 MG PO TABS: 40.0000 mg | ORAL_TABLET | Freq: Every day | ORAL | Status: DC

## 2012-07-17 MED ORDER — HYDROXYZINE HCL 25 MG PO TABS: 25.0000 mg | ORAL_TABLET | Freq: Four times a day (QID) | ORAL | Status: AC | PRN

## 2012-07-17 MED ORDER — HYDROXYZINE HCL 25 MG PO TABS
25.0000 mg | ORAL_TABLET | Freq: Once | ORAL | Status: AC
  Administered 2012-07-17: 25 mg via ORAL
  Filled 2012-07-17: qty 1

## 2012-07-17 MED ORDER — PREDNISONE 20 MG PO TABS
60.0000 mg | ORAL_TABLET | Freq: Once | ORAL | Status: AC
  Administered 2012-07-17: 60 mg via ORAL
  Filled 2012-07-17: qty 3

## 2012-07-17 NOTE — ED Provider Notes (Signed)
Medical screening examination/treatment/procedure(s) were performed by non-physician practitioner and as supervising physician I was immediately available for consultation/collaboration.   Hurman Horn, MD 07/17/12 [phone]

## 2012-07-17 NOTE — ED Notes (Signed)
Pt presents w/ c/o itching all over, Onset on Thursday after moving into a new apt on Monday. No new detergents, soaps etc.  Has treated w/ Benadryl w/o any help.

## 2012-07-17 NOTE — ED Provider Notes (Signed)
History     CSN: [id number]  Arrival date & time 07/17/12  1138   First MD Initiated Contact with Patient 07/17/12 1313      Chief Complaint  Patient presents with  . Pruritis    (Consider location/radiation/quality/duration/timing/severity/associated sxs/prior treatment) HPI Comments: Patient reports she moved to a new apartment 5 days ago, 2 days ago she developed a pruritic rash over her chest, abdomen, back, and bilateral upper extremities.  States they moved things into the house from storage and her parents' house.  States her daughter also has a pruritic rash but states her rash looks different than hers. Denies any new soaps, detergents, personal care products, lotions, clothes, bedding.  The apartment does not have carpeting, but was recently cleaned by a cleaning crew prior to their arrival.    The history is provided by the patient.    Past Medical History  Diagnosis Date  . Asthma   . Hypertension   . Diabetes mellitus     Past Surgical History  Procedure Date  . Cesarean section   . Cholecystectomy   . Tonsillectomy   . Carpal tunnel release   . Dilation and curettage of uterus     No family history on file.  History  Substance Use Topics  . Smoking status: Never Smoker   . Smokeless tobacco: Never Used  . Alcohol Use: No    OB History    Grav Para Term Preterm Abortions TAB SAB Ect Mult Living                  Review of Systems  Constitutional: Negative for fever and chills.  HENT: Negative for sore throat and trouble swallowing.   Respiratory: Negative for cough and shortness of breath.   Cardiovascular: Negative for chest pain.  Gastrointestinal: Negative for nausea, vomiting, abdominal pain and diarrhea.  Genitourinary: Negative for menstrual problem.    Allergies  Shrimp and Zosyn  Home Medications   Current Outpatient Rx  Name Route Sig Dispense Refill  . ALBUTEROL SULFATE HFA 108 (90 BASE) MCG/ACT IN AERS Inhalation Inhale 2 puffs  into the lungs every 6 (six) hours as needed. For shortness of breath    . ALBUTEROL SULFATE (2.5 MG/3ML) 0.083% IN NEBU Nebulization Take 2.5 mg by nebulization every 6 (six) hours as needed. For shortness of breath    . AMLODIPINE BESYLATE 10 MG PO TABS Oral Take 10 mg by mouth daily.      Marland Kitchen CETIRIZINE HCL 10 MG PO TABS Oral Take 10 mg by mouth daily.      Marland Kitchen FLUOXETINE HCL 20 MG PO CAPS Oral Take 20 mg by mouth daily.      Marland Kitchen FLUTICASONE-SALMETEROL 100-50 MCG/DOSE IN AEPB Inhalation Inhale 1 puff into the lungs every 12 (twelve) hours.      Marland Kitchen HYDROCODONE-ACETAMINOPHEN 5-500 MG PO TABS Oral Take 1 tablet by mouth every 6 (six) hours as needed. pain 30 tablet 0  . IBUPROFEN 200 MG PO TABS Oral Take 800 mg by mouth every 6 (six) hours as needed. For pain    . METFORMIN HCL ER (MOD) 500 MG PO TB24 Oral Take 500 mg by mouth at bedtime.     Marland Kitchen MONTELUKAST SODIUM 10 MG PO TABS Oral Take 10 mg by mouth at bedtime.      . OMEPRAZOLE 10 MG PO CPDR Oral Take 10 mg by mouth daily.        BP 159/98  Pulse 84  Temp 98.7 F (  37.1 C) (Oral)  Resp 22  SpO2 98%  LMP 06/23/2012  Physical Exam  Nursing note and vitals reviewed. Constitutional: She appears well-developed and well-nourished. No distress.  HENT:  Head: Normocephalic and atraumatic.  Mouth/Throat: Oropharynx is clear and moist. No oropharyngeal exudate.  Neck: Neck supple.  Pulmonary/Chest: Effort normal and breath sounds normal. No respiratory distress. She has no wheezes. She has no rales.  Neurological: She is alert.  Skin: Rash noted. She is not diaphoretic.       Fine erythematous papular rash over torso/back, face, arms - spares palms.      ED Course  Procedures (including critical care time)  Labs Reviewed - No data to display No results found.   1. Allergic dermatitis       MDM  Patient with pruritic rash over trunk and arms, some parts of face.  Pt is afebrile, nontoxic, no ill symptoms.  No airway concerns, no  wheezing, stridor, SOB, difficulty swallowing or breathing.  Daughter also with pruritic rash.  Pt moved into new house this week - likely a reaction to something new there, a cleaning product used prior to their arrival, or possibly contamination of something that was brought out of storage.  No e/o superinfection.  D/C home with PCP follow up.   Discussed diagnosis and care plan with patient.  Pt given return precautions.  Pt verbalizes understanding and agrees with plan.           [place], [place] 07/17/12 1435

## 2012-09-20 ENCOUNTER — Emergency Department (HOSPITAL_COMMUNITY)
Admission: EM | Admit: 2012-09-20 | Discharge: 2012-09-20 | Disposition: A | Discharge status: Home / Self Care | Payer: Self-pay | Attending: Emergency Medicine | Admitting: Emergency Medicine

## 2012-09-20 ENCOUNTER — Encounter (HOSPITAL_COMMUNITY): Payer: Self-pay | Admitting: Emergency Medicine

## 2012-09-20 DIAGNOSIS — R350 Frequency of micturition: Secondary | ICD-10-CM | POA: Insufficient documentation

## 2012-09-20 DIAGNOSIS — Z79899 Other long term (current) drug therapy: Secondary | ICD-10-CM | POA: Insufficient documentation

## 2012-09-20 DIAGNOSIS — J45909 Unspecified asthma, uncomplicated: Secondary | ICD-10-CM | POA: Insufficient documentation

## 2012-09-20 DIAGNOSIS — E119 Type 2 diabetes mellitus without complications: Secondary | ICD-10-CM | POA: Insufficient documentation

## 2012-09-20 DIAGNOSIS — I10 Essential (primary) hypertension: Secondary | ICD-10-CM | POA: Insufficient documentation

## 2012-09-20 DIAGNOSIS — M549 Dorsalgia, unspecified: Secondary | ICD-10-CM | POA: Insufficient documentation

## 2012-09-20 LAB — URINALYSIS, ROUTINE W REFLEX MICROSCOPIC
Bilirubin Urine: NEGATIVE
Hgb urine dipstick: NEGATIVE
Ketones, ur: NEGATIVE mg/dL
Specific Gravity, Urine: 1.023 (ref 1.005–1.030)
Urobilinogen, UA: 0.2 mg/dL (ref 0.0–1.0)
pH: 6 (ref 5.0–8.0)

## 2012-09-20 LAB — GLUCOSE, CAPILLARY: Glucose-Capillary: 106 mg/dL — ABNORMAL HIGH (ref 70–99)

## 2012-09-20 MED ORDER — DIAZEPAM 5 MG PO TABS: 5.0000 mg | ORAL_TABLET | Freq: Three times a day (TID) | ORAL | Status: DC | PRN

## 2012-09-20 MED ORDER — HYDROCODONE-ACETAMINOPHEN 5-325 MG PO TABS: 1.0000 | ORAL_TABLET | ORAL | Status: DC | PRN

## 2012-09-20 NOTE — ED Provider Notes (Signed)
History     CSN: [id number]  Arrival date & time 09/20/12  1839   First MD Initiated Contact with Patient 09/20/12 1923      Chief Complaint  Patient presents with  . Back Pain    (Consider location/radiation/quality/duration/timing/severity/associated sxs/prior treatment) HPI Comments: Patient with history of right sided sciatic pain presents with one week of worsening pain in her back now with radiation to her right shoulder blade.  States this feels like a continuation of her sciatic pain.  Pain is described as deep, is sharp, aching, throbbing, and electric.  Is worse with bending, twisting, moving.  No known injury.  Pt does have urinary frequency.  Also has not had menstrual period since July.  Notes she had some swelling in her lower abdomen a few weeks ago that is now better - states the last time this happened she was pregnant.  Denies fevers, CP, SOB, cough, abdominal pain, N/V/D.  Pt is diabetic, does not check her blood sugars.    Patient is a 41 y.o. female presenting with back pain. The history is provided by the patient.  Back Pain  Pertinent negatives include no chest pain, no fever, no abdominal pain and no dysuria.    Past Medical History  Diagnosis Date  . Asthma   . Hypertension   . Diabetes mellitus     Past Surgical History  Procedure Date  . Cesarean section   . Cholecystectomy   . Tonsillectomy   . Carpal tunnel release   . Dilation and curettage of uterus     History reviewed. No pertinent family history.  History  Substance Use Topics  . Smoking status: Never Smoker   . Smokeless tobacco: Never Used  . Alcohol Use: No    OB History    Grav Para Term Preterm Abortions TAB SAB Ect Mult Living                  Review of Systems  Constitutional: Negative for fever.  Respiratory: Negative for cough and shortness of breath.   Cardiovascular: Negative for chest pain.  Gastrointestinal: Negative for nausea, vomiting, abdominal pain and  diarrhea.  Genitourinary: Positive for frequency and menstrual problem. Negative for dysuria and urgency.  Musculoskeletal: Positive for back pain.    Allergies  Shrimp and Zosyn  Home Medications   Current Outpatient Rx  Name  Route  Sig  Dispense  Refill  . ALBUTEROL SULFATE HFA 108 (90 BASE) MCG/ACT IN AERS   Inhalation   Inhale 2 puffs into the lungs every 6 (six) hours as needed. For shortness of breath         . AMLODIPINE BESYLATE 10 MG PO TABS   Oral   Take 10 mg by mouth daily.           Marland Kitchen CETIRIZINE HCL 10 MG PO TABS   Oral   Take 10 mg by mouth daily.           Marland Kitchen FAMOTIDINE 20 MG PO TABS   Oral   Take 1 tablet (20 mg total) by mouth 2 (two) times daily. X 3 days then as needed   30 tablet   0   . FLUOXETINE HCL 20 MG PO CAPS   Oral   Take 20 mg by mouth daily.           Marland Kitchen FLUTICASONE-SALMETEROL 100-50 MCG/DOSE IN AEPB   Inhalation   Inhale 1 puff into the lungs every 12 (twelve) hours.           Marland Kitchen  HYDROCODONE-ACETAMINOPHEN 5-500 MG PO TABS   Oral   Take 1 tablet by mouth every 6 (six) hours as needed. pain   30 tablet   0   . IBUPROFEN 200 MG PO TABS   Oral   Take 800 mg by mouth every 6 (six) hours as needed. For pain         . METFORMIN HCL ER (MOD) 500 MG PO TB24   Oral   Take 500 mg by mouth at bedtime.          Marland Kitchen DIPHENHYDRAMINE HCL 25 MG PO CAPS   Oral   Take 1 capsule (25 mg total) by mouth every 6 (six) hours as needed for itching (x 3 days then as needed).   30 capsule   0     BP 150/97  Pulse 88  Temp 98.8 F (37.1 C) (Oral)  Resp 18  SpO2 100%  LMP 05/25/2012  Physical Exam  Nursing note and vitals reviewed. Constitutional: She appears well-developed and well-nourished. No distress.  HENT:  Head: Normocephalic and atraumatic.  Neck: Neck supple.  Cardiovascular: Normal rate and regular rhythm.   Pulmonary/Chest: Effort normal and breath sounds normal. No respiratory distress. She has no wheezes. She has no  rales.  Abdominal: Soft. She exhibits no distension and no mass. There is no tenderness. There is no rebound and no guarding.       Morbidly obese  Musculoskeletal:       Arms:      Diffuse lower back tenderness.  Spine without crepitus or step-offs.  No skin changes.  Mild tenderness around right scapula.    Extremities:  Strength 5/5, sensation intact, distal pulses intact.    Neurological: She is alert.  Skin: She is not diaphoretic.    ED Course  Procedures (including critical care time)  Labs Reviewed  GLUCOSE, CAPILLARY - Abnormal; Notable for the following:    Glucose-Capillary 106 (*)     All other components within normal limits  URINALYSIS, ROUTINE W REFLEX MICROSCOPIC  PREGNANCY, URINE   No results found.   1. Back pain     MDM  Pt with hx chronic back pain and right sided sciatic nerve pain now with pain radiating upward towards her right scapula.  Denies abdominal pain.  No abdominal tenderness.  No symptoms concerning for gallbladder or intra-abdominal pathology that might cause referred pain.  No injury.  No spinal tenderness.  Neurovascularly intact.  No red flags for back pain.  Pt d/c home with PCP follow up, valium and norco (#8) for pain.  Discussed all results with patient.  Pt given return precautions.  Pt verbalizes understanding and agrees with plan.           [place], [place] 09/20/12 2051

## 2012-09-20 NOTE — ED Notes (Signed)
Patient reports that she has a multiple year history of sciatic pain. The patient reports that last Monday her right shoulder and should blade / back hurts the same

## 2012-09-20 NOTE — ED Notes (Signed)
CBG 94 

## 2012-09-21 NOTE — ED Provider Notes (Signed)
Medical screening examination/treatment/procedure(s) were performed by non-physician practitioner and as supervising physician I was immediately available for consultation/collaboration.  Devanie Galanti, MD 09/21/12 0713 

## 2012-10-30 ENCOUNTER — Other Ambulatory Visit: Payer: Self-pay | Admitting: Physician Assistant

## 2012-11-01 NOTE — Telephone Encounter (Signed)
Need paper record

## 2013-02-17 ENCOUNTER — Telehealth: Payer: Self-pay | Admitting: *Deleted

## 2013-02-17 DIAGNOSIS — R0683 Snoring: Secondary | ICD-10-CM

## 2013-02-17 DIAGNOSIS — G4730 Sleep apnea, unspecified: Secondary | ICD-10-CM

## 2013-02-17 NOTE — Telephone Encounter (Signed)
Spk to pt when trying to schedule her husband's sleep study.  She mentions she never has gotten a CPAP machine, she had insurance that expired and didn't follow up.  She now has [employer] and would like to proceed.  The most recent order on file is 09/23/2011 and is expired.  Need new order for CPAP.  Previously prescribed settings are:  CPAP 9 cm H2O with EPR of 1.  Sleep study was completed on 09/09/2011.  Patient is preparing for gastric bypass surgery, we may want to order Auto CPAP to allow for adjustments during weight loss. -sh

## 2013-02-17 NOTE — Telephone Encounter (Signed)
Patient  To be scheduled for SPLIT as her older PSG is now 18 month ago, she should be able to start quickly on CPAP - one hour of diagnostic sleep and CO2  Would be nice, than titrate - see order. CD

## 2013-03-07 ENCOUNTER — Other Ambulatory Visit: Payer: Self-pay | Admitting: Neurology

## 2013-03-07 DIAGNOSIS — G4733 Obstructive sleep apnea (adult) (pediatric): Secondary | ICD-10-CM

## 2013-03-07 NOTE — Progress Notes (Signed)
Patient needs titration study to follow up on PSG with diagnosis of apnea 18 month ago.

## 2013-03-07 NOTE — Telephone Encounter (Signed)
Please call patient with PSG split order -I will only need one hour for diagnostics if her insurance allows that.  Split at 10 and 3% score.

## 2013-03-07 NOTE — Telephone Encounter (Signed)
This patient has a $3000 deductible and none has been met.  She never did get CPAP when previously prescribed due to insurance issues, she would like to start using CPAP now.  She is preparing to have Gastric Bypass surgery soon so she will be losing significant amount of weight.  Could you consider order autopap for her at this time rather than asking her to come in for another sleep study (which she isn't able to do).

## 2013-03-08 ENCOUNTER — Other Ambulatory Visit: Payer: Self-pay | Admitting: Neurology

## 2013-03-08 DIAGNOSIS — G4733 Obstructive sleep apnea (adult) (pediatric): Secondary | ICD-10-CM

## 2013-03-08 NOTE — Telephone Encounter (Signed)
Sure thing, since this is auto CPAP, could you please recommend a minimum pressure and a maximum pressure for this patient and I will enter those orders.  Thank you so much!

## 2013-03-09 NOTE — Telephone Encounter (Signed)
Dr. Vickey Huger recorded recommended pressure of 4-12 cm H2O for Auto CPAP on 03/08/2013. -sh

## 2013-03-09 NOTE — Addendum Note (Signed)
Addended by: Bonita Quin B on: 03/09/2013 02:57 PM   Modules accepted: Orders

## 2013-03-17 ENCOUNTER — Telehealth: Payer: Self-pay | Admitting: *Deleted

## 2013-03-17 NOTE — Telephone Encounter (Signed)
Message copied by Daryll Drown on Thu Mar 17, 2013  2:31 PM ------      Message from: [facility]      Created: Thu Mar 17, 2013 10:54 AM      Regarding: Order for CPAP       Kelsee Preslar, you have a note in the patient's chart that you spoke with her regarding an order for cpap.  The order was faxed to [facility] but since the study was 2 years ago and she was never set up, they are requiring her to start the process over again.  I guess with a new study.  Please advise patient ------

## 2013-03-17 NOTE — Telephone Encounter (Signed)
Called to let patient know that orders could not be filled because patient's sleep study was not recent enough.  Let her know she did have recent sleep study orders in the system if she would like to pursue sleep study in the future.  Patient understands that [facility] will not provide Auto CPAP without a new sleep study.    Pt also requests a copy of her study for her records (mentions disability case).  Asks if we will send report to her home address (current address in Epic is incorrect) [address] [place], [place], [place] [address]

## 2013-06-30 ENCOUNTER — Encounter (HOSPITAL_COMMUNITY): Payer: Self-pay | Admitting: Emergency Medicine

## 2013-06-30 ENCOUNTER — Emergency Department (HOSPITAL_COMMUNITY): Payer: BC Managed Care – PPO

## 2013-06-30 ENCOUNTER — Emergency Department (HOSPITAL_COMMUNITY)
Admission: EM | Admit: 2013-06-30 | Discharge: 2013-06-30 | Disposition: A | Discharge status: Home / Self Care | Payer: BC Managed Care – PPO | Attending: Emergency Medicine | Admitting: Emergency Medicine

## 2013-06-30 DIAGNOSIS — J45909 Unspecified asthma, uncomplicated: Secondary | ICD-10-CM | POA: Insufficient documentation

## 2013-06-30 DIAGNOSIS — M5412 Radiculopathy, cervical region: Secondary | ICD-10-CM | POA: Insufficient documentation

## 2013-06-30 DIAGNOSIS — I10 Essential (primary) hypertension: Secondary | ICD-10-CM | POA: Insufficient documentation

## 2013-06-30 DIAGNOSIS — Z79899 Other long term (current) drug therapy: Secondary | ICD-10-CM | POA: Insufficient documentation

## 2013-06-30 DIAGNOSIS — Y9389 Activity, other specified: Secondary | ICD-10-CM | POA: Insufficient documentation

## 2013-06-30 DIAGNOSIS — W1811XA Fall from or off toilet without subsequent striking against object, initial encounter: Secondary | ICD-10-CM | POA: Insufficient documentation

## 2013-06-30 DIAGNOSIS — S46909A Unspecified injury of unspecified muscle, fascia and tendon at shoulder and upper arm level, unspecified arm, initial encounter: Secondary | ICD-10-CM | POA: Insufficient documentation

## 2013-06-30 DIAGNOSIS — S4980XA Other specified injuries of shoulder and upper arm, unspecified arm, initial encounter: Secondary | ICD-10-CM | POA: Insufficient documentation

## 2013-06-30 DIAGNOSIS — S161XXA Strain of muscle, fascia and tendon at neck level, initial encounter: Secondary | ICD-10-CM

## 2013-06-30 DIAGNOSIS — E669 Obesity, unspecified: Secondary | ICD-10-CM | POA: Insufficient documentation

## 2013-06-30 DIAGNOSIS — R209 Unspecified disturbances of skin sensation: Secondary | ICD-10-CM | POA: Insufficient documentation

## 2013-06-30 DIAGNOSIS — M5410 Radiculopathy, site unspecified: Secondary | ICD-10-CM

## 2013-06-30 DIAGNOSIS — S0990XA Unspecified injury of head, initial encounter: Secondary | ICD-10-CM | POA: Insufficient documentation

## 2013-06-30 DIAGNOSIS — S139XXA Sprain of joints and ligaments of unspecified parts of neck, initial encounter: Secondary | ICD-10-CM | POA: Insufficient documentation

## 2013-06-30 DIAGNOSIS — Y929 Unspecified place or not applicable: Secondary | ICD-10-CM | POA: Insufficient documentation

## 2013-06-30 DIAGNOSIS — E119 Type 2 diabetes mellitus without complications: Secondary | ICD-10-CM | POA: Insufficient documentation

## 2013-06-30 MED ORDER — DIAZEPAM 5 MG PO TABS
5.0000 mg | ORAL_TABLET | Freq: Once | ORAL | Status: AC
  Administered 2013-06-30: 5 mg via ORAL
  Filled 2013-06-30: qty 1

## 2013-06-30 MED ORDER — KETOROLAC TROMETHAMINE 30 MG/ML IJ SOLN
30.0000 mg | Freq: Once | INTRAMUSCULAR | Status: AC
  Administered 2013-06-30: 30 mg via INTRAMUSCULAR
  Filled 2013-06-30: qty 1

## 2013-06-30 MED ORDER — DIAZEPAM 5 MG PO TABS: 5.0000 mg | ORAL_TABLET | Freq: Three times a day (TID) | ORAL | Status: DC | PRN

## 2013-06-30 MED ORDER — IBUPROFEN 600 MG PO TABS: 600.0000 mg | ORAL_TABLET | Freq: Four times a day (QID) | ORAL | Status: DC | PRN

## 2013-06-30 MED ORDER — HYDROCODONE-ACETAMINOPHEN 5-325 MG PO TABS: 1.0000 | ORAL_TABLET | ORAL | Status: DC | PRN

## 2013-06-30 NOTE — ED Provider Notes (Signed)
CSN: [id number]     Arrival date & time 06/30/13  7846 History     First MD Initiated Contact with Patient 06/30/13 0957     Chief Complaint  Patient presents with  . Fall   (Consider location/radiation/quality/duration/timing/severity/associated sxs/prior Treatment) HPI This is a 42 year old female with a history of diabetes and hypertension who presents following a fall. The patient reports a mechanical fall the bathtub last night. This happened at approximately 5 PM. She does not think she lost consciousness. She fell onto her left shoulder.  Since that time she has had increasing left shoulder and neck pain. She states that she gets stingers that run from her neck down to her left hand. Patient endorses a mild frontal headache without vision changes or focal neurologic deficit.  Patient is not currently on any anticoagulants.  The patient has been ambulatory since the event. Past Medical History  Diagnosis Date  . Asthma   . Hypertension   . Diabetes mellitus    Past Surgical History  Procedure Laterality Date  . Cesarean section    . Cholecystectomy    . Tonsillectomy    . Carpal tunnel release    . Dilation and curettage of uterus     No family history on file. History  Substance Use Topics  . Smoking status: Never Smoker   . Smokeless tobacco: Never Used  . Alcohol Use: No   OB History   Grav Para Term Preterm Abortions TAB SAB Ect Mult Living                 Review of Systems  Constitutional: Negative for fever.  HENT: Negative for neck stiffness.        Denies midline neck pain, endorses left-sided cervical muscular pain  Respiratory: Negative for cough, chest tightness and shortness of breath.   Cardiovascular: Negative for chest pain.  Gastrointestinal: Negative for nausea, vomiting and abdominal pain.  Genitourinary: Negative for dysuria.  Musculoskeletal: Negative for back pain and joint swelling.  Skin: Negative for wound.  Neurological: Positive for  headaches. Negative for dizziness, syncope and weakness.       Reports paresthesias down the left arm  Psychiatric/Behavioral: Negative for confusion.  All other systems reviewed and are negative.    Allergies  Shrimp and Zosyn  Home Medications   Current Outpatient Rx  Name  Route  Sig  Dispense  Refill  . albuterol (PROVENTIL HFA;VENTOLIN HFA) 108 (90 BASE) MCG/ACT inhaler   Inhalation   Inhale 2 puffs into the lungs every 6 (six) hours as needed for wheezing or shortness of breath.          Marland Kitchen amLODipine (NORVASC) 10 MG tablet   Oral   Take 10 mg by mouth daily.           . cetirizine (ZYRTEC) 10 MG tablet   Oral   Take 10 mg by mouth daily as needed for allergies.          . diazepam (VALIUM) 5 MG tablet   Oral   Take 1 tablet (5 mg total) by mouth every 8 (eight) hours as needed (muscle spasm or pain).   10 tablet   0   . DULoxetine (CYMBALTA) 60 MG capsule   Oral   Take 60 mg by mouth daily.         . famotidine (PEPCID) 20 MG tablet   Oral   Take 20 mg by mouth 2 (two) times daily as needed for heartburn.         Marland Kitchen  HYDROcodone-acetaminophen (NORCO/VICODIN) 5-325 MG per tablet   Oral   Take 1 tablet by mouth every 6 (six) hours as needed for pain.         Marland Kitchen ibuprofen (ADVIL,MOTRIN) 200 MG tablet   Oral   Take 600 mg by mouth every 8 (eight) hours as needed for pain.         . metFORMIN (GLUMETZA) 500 MG (MOD) 24 hr tablet   Oral   Take 500 mg by mouth at bedtime.          . diazepam (VALIUM) 5 MG tablet   Oral   Take 1 tablet (5 mg total) by mouth every 8 (eight) hours as needed (for muscle spasm).   10 tablet   0   . HYDROcodone-acetaminophen (NORCO/VICODIN) 5-325 MG per tablet   Oral   Take 1 tablet by mouth every 4 (four) hours as needed for pain.   10 tablet   0   . ibuprofen (ADVIL,MOTRIN) 600 MG tablet   Oral   Take 1 tablet (600 mg total) by mouth every 6 (six) hours as needed for pain.   30 tablet   0    BP 157/92   Pulse 72  Temp(Src) 98.5 F (36.9 C) (Oral)  Resp 20  SpO2 98% Physical Exam  Nursing note and vitals reviewed. Constitutional: She is oriented to person, place, and time. She appears well-developed and well-nourished. No distress.  Obese  HENT:  Head: Normocephalic and atraumatic.  Eyes: EOM are normal. Pupils are equal, round, and reactive to light.  Neck: Normal range of motion. Neck supple.  No midline cervical tenderness. Tenderness to palpation of the left paraspinous muscles.  Cardiovascular: Normal rate, regular rhythm and normal heart sounds.   Pulmonary/Chest: Effort normal and breath sounds normal. No respiratory distress. She has no wheezes.  Abdominal: Soft.  Musculoskeletal:  Tenderness to palpation across the rhomboid musculature. Focused examination of the left shoulder reveals limited range of motion with abduction secondary to pain. Normal strength. Normal sensation. No tenderness to palpation along the clavicle.  Neurological: She is alert and oriented to person, place, and time.  5 out of 5 strength in all 4 extremities, no facial droop noted, no dysmetria noted, normal gait  Skin: Skin is warm and dry.  Psychiatric: She has a normal mood and affect.    ED Course   Procedures (including critical care time)  Labs Reviewed - No data to display Dg Clavicle Left  06/30/2013   *RADIOLOGY REPORT*  Clinical Data: Fall, left shoulder pain  LEFT CLAVICLE - 2+ VIEWS  Comparison: None.  Findings: Two views of the left clavicle submitted.  No acute fracture or subluxation.  IMPRESSION: No acute fracture or subluxation.   Original Report Authenticated By: Natasha Mead, M.D.   Dg Shoulder Left  06/30/2013   *RADIOLOGY REPORT*  Clinical Data: Fall  LEFT SHOULDER - 2+ VIEW  Comparison: None  Findings: Normal alignment and no fracture.  No significant degenerative change.  Suboptimal detail due to large patient size.  IMPRESSION: Negative for fracture.   Original Report  Authenticated By: Janeece Riggers, M.D.   1. Radiculopathy affecting upper extremity   2. Cervical strain, acute, initial encounter     MDM  This is a 42 year old female who sustained a fall in the bathtub last night. She is proximally 16 hours post injury. She felt her left shoulder. She is nontoxic-appearing on exam and her vital signs are notable for a blood pressure 157/92. She  is nonfocal. She does have tenderness to palpation of the musculature of the left neck and shoulder. Palpation of the left shoulder reproduces tingling down her arm. Plain films are negative for acute fracture. Patient was given Valium and Norco with some relief of her pain. At this time I have low suspicion for intracranial injury or vertebral injury based on the patient's physical exam. I feel her symptoms are most related to a cervical strain and cervical radiculopathy. Patient will be sent home with a short course of Valium for muscle relaxant, Norco for pain relief, and ibuprofen for anti-inflammatory. The patient has been given a narcotic pain medication at discharge. He/she was instructed to avoid operating heavy machinery or drive while taking   the patient will follow up with her primary care physician on Monday.  After history, exam, and medical workup I feel the patient has been appropriately medically screened and is safe for discharge home. Pertinent diagnoses were discussed with the patient. Patient was given return precautions.   Shon Baton, MD 06/30/13 1140

## 2013-06-30 NOTE — ED Notes (Addendum)
Pt presenting to ed with c/o falling while getting out of the shower pt states she hit her private area, left shoulder and her head pt states she is unsure if she had positive loc. Pt states pain is worse today and she is having headache's today. Pt states my shoulder took most of the blow. Pt denies nausea, vomiting, dizziness and being on blood thinners

## 2013-09-27 ENCOUNTER — Ambulatory Visit: Payer: BC Managed Care – PPO | Admitting: Neurology

## 2013-10-21 ENCOUNTER — Emergency Department (HOSPITAL_COMMUNITY)
Admission: EM | Admit: 2013-10-21 | Discharge: 2013-10-21 | Disposition: A | Discharge status: Home / Self Care | Payer: BC Managed Care – PPO | Attending: Emergency Medicine | Admitting: Emergency Medicine

## 2013-10-21 ENCOUNTER — Encounter (HOSPITAL_COMMUNITY): Payer: Self-pay | Admitting: Emergency Medicine

## 2013-10-21 DIAGNOSIS — M5431 Sciatica, right side: Secondary | ICD-10-CM

## 2013-10-21 DIAGNOSIS — Z79899 Other long term (current) drug therapy: Secondary | ICD-10-CM | POA: Insufficient documentation

## 2013-10-21 DIAGNOSIS — G8929 Other chronic pain: Secondary | ICD-10-CM | POA: Insufficient documentation

## 2013-10-21 DIAGNOSIS — G479 Sleep disorder, unspecified: Secondary | ICD-10-CM | POA: Insufficient documentation

## 2013-10-21 DIAGNOSIS — J45909 Unspecified asthma, uncomplicated: Secondary | ICD-10-CM | POA: Insufficient documentation

## 2013-10-21 DIAGNOSIS — I10 Essential (primary) hypertension: Secondary | ICD-10-CM | POA: Insufficient documentation

## 2013-10-21 DIAGNOSIS — M5430 Sciatica, unspecified side: Secondary | ICD-10-CM | POA: Insufficient documentation

## 2013-10-21 DIAGNOSIS — E119 Type 2 diabetes mellitus without complications: Secondary | ICD-10-CM | POA: Insufficient documentation

## 2013-10-21 MED ORDER — METHOCARBAMOL 500 MG PO TABS: 500.0000 mg | ORAL_TABLET | Freq: Two times a day (BID) | ORAL | Status: DC

## 2013-10-21 MED ORDER — PREDNISONE 20 MG PO TABS: ORAL_TABLET | ORAL | Status: DC

## 2013-10-21 MED ORDER — OXYCODONE-ACETAMINOPHEN 5-325 MG PO TABS: 2.0000 | ORAL_TABLET | ORAL | Status: DC | PRN

## 2013-10-21 NOTE — ED Provider Notes (Signed)
Medical screening examination/treatment/procedure(s) were performed by non-physician practitioner and as supervising physician I was immediately available for consultation/collaboration.  EKG Interpretation   None         Gilda Crease, MD 10/21/13 [phone]

## 2013-10-21 NOTE — ED Provider Notes (Signed)
CSN: [id number]     Arrival date & time 10/21/13  1625 History  This chart was scribed for Fayrene Helper, PA-C, working with Gilda Crease, * by Blanchard Kelch, ED Scribe. This patient was seen in room WTR5/WTR5 and the patient's care was started at 6:08 PM.    Chief Complaint  Patient presents with  . Sciatica    The history is provided by the patient. No language interpreter was used.    HPI Comments: Michele Craig is a 42 y.o. female with a history of fibromyalgia, diabetes and sciatica (diagnosed at [facility] <5 years) who presents to the Emergency Department complaining of recurrent constant lower back pain that began about a week ago but worsened four days ago. The pain radiates down the back and side of the right leg to the end of the thigh. The pain is worsened by walking and sudden changes in movement. She describes the pain as throbbing. She has been unable to sleep due to the pain. She has been taking Meloxocam and Cymbalta for the pain without relief. She has been prescribed steroids and muscle relaxants in the past for similar pain with relief. She reports that she is schedule to have back surgery as well as bypass surgery in the future due to chronic pain and obesity. She denies any new numbness, fever, rash, or bowel or bladder incontinence. She denies history of IV drug use or cancer. She has been pregnant four times and had four births.   Past Medical History  Diagnosis Date  . Asthma   . Hypertension   . Diabetes mellitus    Past Surgical History  Procedure Laterality Date  . Cesarean section    . Cholecystectomy    . Tonsillectomy    . Carpal tunnel release    . Dilation and curettage of uterus     No family history on file. History  Substance Use Topics  . Smoking status: Never Smoker   . Smokeless tobacco: Never Used  . Alcohol Use: No   OB History   Grav Para Term Preterm Abortions TAB SAB Ect Mult Living                 Review of Systems   Constitutional: Negative for fever.  Musculoskeletal: Positive for back pain.  Skin: Negative for rash.  Neurological: Negative for numbness.  Psychiatric/Behavioral: Positive for sleep disturbance.    Allergies  Shrimp and Zosyn  Home Medications   Current Outpatient Rx  Name  Route  Sig  Dispense  Refill  . albuterol (PROVENTIL HFA;VENTOLIN HFA) 108 (90 BASE) MCG/ACT inhaler   Inhalation   Inhale 2 puffs into the lungs every 6 (six) hours as needed for wheezing or shortness of breath.          Marland Kitchen amLODipine (NORVASC) 10 MG tablet   Oral   Take 10 mg by mouth daily.           . cyclobenzaprine (FLEXERIL) 5 MG tablet   Oral   Take 5 mg by mouth at bedtime.         . diazepam (VALIUM) 5 MG tablet   Oral   Take 1 tablet (5 mg total) by mouth every 8 (eight) hours as needed (muscle spasm or pain).   10 tablet   0   . DULoxetine (CYMBALTA) 60 MG capsule   Oral   Take 60 mg by mouth daily.         Marland Kitchen FLUoxetine (PROZAC) 20  MG tablet   Oral   Take 20 mg by mouth daily.         . hydrOXYzine (ATARAX/VISTARIL) 25 MG tablet   Oral   Take 25 mg by mouth 3 (three) times daily as needed for itching.         Marland Kitchen ibuprofen (ADVIL,MOTRIN) 200 MG tablet   Oral   Take 600 mg by mouth every 8 (eight) hours as needed for pain.         Marland Kitchen ibuprofen (ADVIL,MOTRIN) 600 MG tablet   Oral   Take 1 tablet (600 mg total) by mouth every 6 (six) hours as needed for pain.   30 tablet   0   . omeprazole (PRILOSEC OTC) 20 MG tablet   Oral   Take 20 mg by mouth daily.         . traMADol (ULTRAM) 50 MG tablet   Oral   Take by mouth every 6 (six) hours as needed.          There were no vitals taken for this visit. Physical Exam  Nursing note and vitals reviewed. Constitutional: She is oriented to person, place, and time. She appears well-developed and well-nourished. No distress.  Morbidly obese african [place] female. Leaning over sink in exam room. Appears to be in  some distress.   HENT:  Head: Normocephalic and atraumatic.  Eyes: EOM are normal.  Neck: Neck supple. No tracheal deviation present.  Cardiovascular: Normal rate.   Pulmonary/Chest: Effort normal. No respiratory distress.  Musculoskeletal: Normal range of motion. She exhibits tenderness.  Tenderness of lumbar and paralumbar region on palpation. Increasing pain with back flexion and extension but difficult to fully assess to due large body habitus.   Neurological: She is alert and oriented to person, place, and time. She has normal reflexes. She displays normal reflexes.  Able to ambulate.   Skin: Skin is warm and dry. No rash noted.  Psychiatric: She has a normal mood and affect. Her behavior is normal.    ED Course  Procedures (including critical care time)   COORDINATION OF CARE: 6:15 PM -Will order short course of narcotic pain medication, steroids, and muscle relaxants. Advised patient on increased risk of stomach irritation with medications so she should take it with food. Also advised patient to keep track of blood sugar levels while taking steroid due to past history of diabetes. Patient verbalizes understanding and agrees with treatment plan.    Labs Review Labs Reviewed - No data to display Imaging Review No results found.  EKG Interpretation   None       MDM   1. Sciatica, right    BP 148/110  Pulse 85  Temp(Src) 98.5 F (36.9 C) (Oral)  Resp 17  SpO2 98%  I personally performed the services described in this documentation, which was scribed in my presence. The recorded information has been reviewed and is accurate.     Fayrene Helper, PA-C 10/21/13 1845

## 2013-10-21 NOTE — ED Notes (Signed)
Pt ambulatory to exam room with steady gait.  

## 2013-10-21 NOTE — ED Notes (Signed)
Pt states that her sciatica really been bothering her since Friday last week and having hard time getting out of the bed since Monday and her fibromyalgia been bothering her as well.

## 2013-12-01 ENCOUNTER — Ambulatory Visit: Payer: BC Managed Care – PPO | Admitting: Neurology

## 2014-05-21 ENCOUNTER — Encounter (HOSPITAL_COMMUNITY): Payer: Self-pay | Admitting: Emergency Medicine

## 2014-05-21 ENCOUNTER — Emergency Department (HOSPITAL_COMMUNITY)
Admission: EM | Admit: 2014-05-21 | Discharge: 2014-05-21 | Disposition: A | Discharge status: Home / Self Care | Payer: BC Managed Care – PPO | Attending: Emergency Medicine | Admitting: Emergency Medicine

## 2014-05-21 DIAGNOSIS — Z79899 Other long term (current) drug therapy: Secondary | ICD-10-CM | POA: Diagnosis not present

## 2014-05-21 DIAGNOSIS — Z880 Allergy status to penicillin: Secondary | ICD-10-CM | POA: Diagnosis not present

## 2014-05-21 DIAGNOSIS — J45909 Unspecified asthma, uncomplicated: Secondary | ICD-10-CM | POA: Insufficient documentation

## 2014-05-21 DIAGNOSIS — E119 Type 2 diabetes mellitus without complications: Secondary | ICD-10-CM | POA: Insufficient documentation

## 2014-05-21 DIAGNOSIS — M25569 Pain in unspecified knee: Secondary | ICD-10-CM | POA: Diagnosis present

## 2014-05-21 DIAGNOSIS — I10 Essential (primary) hypertension: Secondary | ICD-10-CM | POA: Insufficient documentation

## 2014-05-21 DIAGNOSIS — M25562 Pain in left knee: Secondary | ICD-10-CM

## 2014-05-21 DIAGNOSIS — M25561 Pain in right knee: Secondary | ICD-10-CM

## 2014-05-21 MED ORDER — OXYCODONE-ACETAMINOPHEN 5-325 MG PO TABS: 1.0000 | ORAL_TABLET | Freq: Four times a day (QID) | ORAL | Status: DC | PRN

## 2014-05-21 NOTE — ED Provider Notes (Signed)
CSN: [id number]     Arrival date & time 05/21/14  1049 History   First MD Initiated Contact with Patient 05/21/14 1129     Chief Complaint  Patient presents with  . Knee Pain     (Consider location/radiation/quality/duration/timing/severity/associated sxs/prior Treatment) HPI Pt is a 43yo morbidly obese female with hx of asthma, HTN, diabetes, and fibromyalgia, presenting to ED with c/o gradually worsening bilateral knee pain that is worse in right knee. Pain is constant, aching, sore, worse with standing and ambulation. Pain is 10/10 at worst. States pain is so bad she cannot drive as using the gas pedal hurts her knee. Pt states she has tried tramadol w/o relief and vicodin with minimal relief. Reports being scheduled for gastric sleeve weight loss surgery on 7/21 so she cannot take NSAIDs at this time.  Denies new injuries. Does report she was advised she may need knee replacement surgery but states she has not f/u with an orthopedist for her knees yet.    Past Medical History  Diagnosis Date  . Asthma   . Hypertension   . Diabetes mellitus   . Fibromyalgia    Past Surgical History  Procedure Laterality Date  . Cesarean section    . Cholecystectomy    . Tonsillectomy    . Carpal tunnel release    . Dilation and curettage of uterus     History reviewed. No pertinent family history. History  Substance Use Topics  . Smoking status: Never Smoker   . Smokeless tobacco: Never Used  . Alcohol Use: No   OB History   Grav Para Term Preterm Abortions TAB SAB Ect Mult Living                 Review of Systems  Constitutional: Negative for fever and chills.  Gastrointestinal: Negative for nausea and vomiting.  Musculoskeletal: Positive for arthralgias ( bilateral knees, R>L). Negative for myalgias.  Skin: Negative for color change, rash and wound.  All other systems reviewed and are negative.     Allergies  Shrimp; Penicillins; and Zosyn  Home Medications   Prior to  Admission medications   Medication Sig Start Date End Date Taking? Authorizing Provider  ADVAIR DISKUS 250-50 MCG/DOSE AEPB Inhale 1 puff into the lungs 2 (two) times daily.  05/17/14  Yes Historical Provider, MD  albuterol (PROVENTIL HFA;VENTOLIN HFA) 108 (90 BASE) MCG/ACT inhaler Inhale 2 puffs into the lungs every 6 (six) hours as needed for wheezing or shortness of breath.    Yes Historical Provider, MD  amLODipine (NORVASC) 10 MG tablet Take 10 mg by mouth every morning.    Yes Historical Provider, MD  atorvastatin (LIPITOR) 20 MG tablet Take 20 mg by mouth every evening.  05/14/14  Yes Historical Provider, MD  cetirizine (ZYRTEC) 10 MG tablet Take 10 mg by mouth at bedtime.   Yes Historical Provider, MD  DULoxetine (CYMBALTA) 60 MG capsule Take 60 mg by mouth every evening.    Yes Historical Provider, MD  furosemide (LASIX) 40 MG tablet Take 60 mg by mouth every morning.  03/08/14  Yes Historical Provider, MD  metFORMIN (GLUCOPHAGE) 500 MG tablet Take 500 mg by mouth 2 (two) times daily with a meal.    Yes Historical Provider, MD  omeprazole (PRILOSEC OTC) 20 MG tablet Take 20 mg by mouth daily.   Yes Historical Provider, MD  Vitamin D, Ergocalciferol, (DRISDOL) 50000 UNITS CAPS capsule Take 50,000 Units by mouth every 7 (seven) days.  05/19/14  Yes Historical  Provider, MD  zolpidem (AMBIEN) 10 MG tablet Take 10 mg by mouth at bedtime.   Yes Historical Provider, MD  oxyCODONE-acetaminophen (PERCOCET/ROXICET) 5-325 MG per tablet Take 1-2 tablets by mouth every 6 (six) hours as needed for moderate pain or severe pain. 05/21/14   Junius FinnerErin O'Malley, PA-C   BP 140/80  Pulse 98  Temp(Src) 99.1 F (37.3 C) (Oral)  Resp 20  SpO2 98% Physical Exam  Nursing note and vitals reviewed. Constitutional: She is oriented to person, place, and time. She appears well-developed and well-nourished.  Morbidly obese female sitting in exam chair, NAD.    HENT:  Head: Normocephalic and atraumatic.  Eyes: EOM are  normal.  Neck: Normal range of motion.  Cardiovascular: Normal rate.   Pulses:      Dorsalis pedis pulses are 2+ on the right side, and 2+ on the left side.  Pulmonary/Chest: Effort normal.  Musculoskeletal: Normal range of motion. She exhibits tenderness. She exhibits no edema.  Right knee: no obvious deformity or edema. Tenderness over patella and bilateral joint line, worse in lateral joint space.   Left knee: no obvious deformity or edema. Tenderness over patella.    Neurological: She is alert and oriented to person, place, and time.  Skin: Skin is warm and dry. No rash noted. No erythema.  Skin in tact. No ecchymosis, erythema, or warmth. No red streaking, induration, or evidence of underlying infection.  Psychiatric: She has a normal mood and affect. Her behavior is normal.    ED Course  Procedures (including critical care time) Labs Review Labs Reviewed - No data to display  Imaging Review No results found.   EKG Interpretation None      MDM   Final diagnoses:  Bilateral knee pain    Pt is a morbidly obese 43yo female presenting to ED c/o worsening bilateral knee pain. Reports scheduled for a gastric sleeve weight loss surgery 7/21, but until then, she cannot have NSAIDs and states tramadol is not helping. Denies new injuries.  Reports hx of arthritis in her knees. denies hx of gout. No evidence of underlying infection. Offered imaging of right knee to visualize joint spacing, however advised pt it would not likely change today's tx plan of RICE therapy, pain medication and f/u with orthopedist. Pt decided to wait on plain films and f/u with orthopedist. Advised she may benefit from knee injections. Return precautions provided. Pt verbalized understanding and agreement with tx plan.     Junius Finnerrin O'Malley, PA-C 05/21/14 1354

## 2014-05-21 NOTE — ED Notes (Signed)
Pt reports chronic bil knee pain for months, pt reports pain has gotten worse in  Last month. Pain 8/10.  Pt has gastric sleeve weight loss surgery on 7/21.

## 2014-05-24 NOTE — ED Provider Notes (Signed)
Medical screening examination/treatment/procedure(s) were performed by non-physician practitioner and as supervising physician I was immediately available for consultation/collaboration.     Suzi Roots, MD 05/24/14 [phone]

## 2016-03-11 ENCOUNTER — Other Ambulatory Visit: Payer: Self-pay | Admitting: Obstetrics and Gynecology

## 2016-03-14 ENCOUNTER — Ambulatory Visit: Payer: Self-pay | Admitting: Physician Assistant

## 2016-03-14 NOTE — H&P (Signed)
Michele Craig is an 45 y.o. female.   Chief Complaint: right carpal tunnel  HPI: Insidious onset of right wrist and hand pain.  Previous carpal tunnel release greater than 5 years ago.  She works from home, on Disability.  She takes Cymbalta, Zyrtec.   She is 45, 5'7", 385 pounds. BMI calculated at 60.  She does have significant sleep apnea.  She goes to [facility] for her medical needs.   This is an initial, non-traumatic, chronic condition.  ALLERGIES TO SHRIMP AND PENICILLIN.    Previous surgery for the carpal tunnel release on the left.  Two C -sections.  Gastric sleeve in July 2015.  History of sleep apnea, fibromyalgia, depression, acid reflux, anxiety, asthma, and high blood pressure.    We obtained NCV which shows severe medial nerve impingement at the wrist on the right.    Past Medical History  Diagnosis Date  . Asthma   . Hypertension   . Diabetes mellitus   . Fibromyalgia     Past Surgical History  Procedure Laterality Date  . Cesarean section    . Cholecystectomy    . Tonsillectomy    . Carpal tunnel release    . Dilation and curettage of uterus      No family history on file. Social History:  reports that she has never smoked. She has never used smokeless tobacco. She reports that she does not drink alcohol or use illicit drugs.  Allergies:  Allergies  Allergen Reactions  . Shrimp [Shellfish Allergy] Anaphylaxis  . Penicillins Hives  . Zosyn [Piperacillin Sod-Tazobactam So] Hives and Itching     (Not in a hospital admission)  No results found for this or any previous visit (from the past 48 hour(s)). No results found.  Review of Systems  Gastrointestinal: Positive for heartburn.  Musculoskeletal: Positive for myalgias.  Neurological: Positive for tingling.  Psychiatric/Behavioral: Positive for depression. The patient is nervous/anxious.   All other systems reviewed and are negative.   There were no vitals taken for this  visit. Physical Exam  Constitutional: She is oriented to person, place, and time. She appears well-developed and well-nourished. No distress.  Morbidly obese  HENT:  Head: Normocephalic and atraumatic.  Nose: Nose normal.  Eyes: Conjunctivae and EOM are normal. Pupils are equal, round, and reactive to light.  Neck: Normal range of motion. Neck supple.  Cardiovascular: Normal rate and intact distal pulses.   Respiratory: Effort normal. No respiratory distress.  GI: Soft. She exhibits no distension. There is no tenderness.  Musculoskeletal:       Right wrist: She exhibits tenderness.  Positive tinels, and phalens  Neurological: She is alert and oriented to person, place, and time.  Skin: Skin is warm and dry. No rash noted. No erythema.  Psychiatric: She has a normal mood and affect. Her behavior is normal.     Assessment/Plan Right carpal tunnel syndrome  Discussed risks and benefits of right carpal tunnel release and she wishes to proceed.  May need to be done in main hospital due to high BMI. Of note she had a poor experience with regional anesthesia but this was years ago elsewhere.   Lance Huaracha, PA-C 03/14/2016, 10:05 AM    

## 2016-03-18 ENCOUNTER — Inpatient Hospital Stay (HOSPITAL_COMMUNITY)
Admission: RE | Admit: 2016-03-18 | Discharge: 2016-03-18 | Disposition: A | Discharge status: Home / Self Care | Payer: Medicaid Other | Source: Ambulatory Visit

## 2016-03-18 NOTE — Pre-Procedure Instructions (Signed)
[place] B Paprocki  03/18/2016      [facility] [id number] [address], [place] - [address] AT [facility] [address] [facility] [address] [place] [address] Phone: [phone] Fax: [phone]    Your procedure is scheduled on Fri, May 12 @ 10:40 AM  Report to [facility] Admitting at 8:40 AM  Call this number if you have problems the morning of surgery:  [phone]   Remember:  Do not eat food or drink liquids after midnight.  Take these medicines the morning of surgery with A SIP OF WATER : advair, albuterol if needed-bring to hospital,amlodipine(norvasc), omeprazole(prilosec),oxycodone if needed                      No Goody's,BC's,Aleve,Aspirin,Advil,Motrin,Ibuprofen,Fish Oil,or any Herbal Medications.    How to Manage Your Diabetes Before and After Surgery  Why is it important to control my blood sugar before and after surgery? . Improving blood sugar levels before and after surgery helps healing and can limit problems. . A way of improving blood sugar control is eating a healthy diet by: o  Eating less sugar and carbohydrates o  Increasing activity/exercise o  Talking with your doctor about reaching your blood sugar goals . High blood sugars (greater than 180 mg/dL) can raise your risk of infections and slow your recovery, so you will need to focus on controlling your diabetes during the weeks before surgery. . Make sure that the doctor who takes care of your diabetes knows about your planned surgery including the date and location.  How do I manage my blood sugar before surgery? . Check your blood sugar at least 4 times a day, starting 2 days before surgery, to make sure that the level is not too high or low. o Check your blood sugar the morning of your surgery when you wake up and every 2 hours until you get to the Short Stay unit. . If your blood sugar is less than 70 mg/dL, you will need to treat for low blood sugar: o Do not take  insulin. o Treat a low blood sugar (less than 70 mg/dL) with  cup of clear juice (cranberry or apple), 4 glucose tablets, OR glucose gel. o Recheck blood sugar in 15 minutes after treatment (to make sure it is greater than 70 mg/dL). If your blood sugar is not greater than 70 mg/dL on recheck, call [phone] for further instructions. . Report your blood sugar to the short stay nurse when you get to Short Stay.  . If you are admitted to the hospital after surgery: o Your blood sugar will be checked by the staff and you will probably be given insulin after surgery (instead of oral diabetes medicines) to make sure you have good blood sugar levels. o The goal for blood sugar control after surgery is 80-180 mg/dL.              WHAT DO I DO ABOUT MY DIABETES MEDICATION?   Marland Kitchen Do not take oral diabetes medicines (pills) the morning of surgery. .    Do not wear jewelry, make-up or nail polish.  Do not wear lotions, powders, or perfumes.   Do not shave 48 hours prior to surgery.   Do not bring valuables to the hospital.  [facility] is not responsible for any belongings or valuables.  Contacts, dentures or bridgework may not be worn into surgery.  Leave your suitcase in  the car.  After surgery it may be brought to your room.  For patients admitted to the hospital, discharge time will be determined by your treatment team.  Patients discharged the day of surgery will not be allowed to drive home.    Special instructions:  [place] - Preparing for Surgery  Before surgery, you can play an important role.  Because skin is not sterile, your skin needs to be as free of germs as possible.  You can reduce the number of germs on you skin by washing with CHG (chlorahexidine gluconate) soap before surgery.  CHG is an antiseptic cleaner which kills germs and bonds with the skin to continue killing germs even after washing.  Please DO NOT use if you have an allergy to CHG or antibacterial  soaps.  If your skin becomes reddened/irritated stop using the CHG and inform your nurse when you arrive at Short Stay.  Do not shave (including legs and underarms) for at least 48 hours prior to the first CHG shower.  You may shave your face.  Please follow these instructions carefully:   1.  Shower with CHG Soap the night before surgery and the                                morning of Surgery.  2.  If you choose to wash your hair, wash your hair first as usual with your       normal shampoo.  3.  After you shampoo, rinse your hair and body thoroughly to remove the                      Shampoo.  4.  Use CHG as you would any other liquid soap.  You can apply chg directly       to the skin and wash gently with scrungie or a clean washcloth.  5.  Apply the CHG Soap to your body ONLY FROM THE NECK DOWN.        Do not use on open wounds or open sores.  Avoid contact with your eyes,       ears, mouth and genitals (private parts).  Wash genitals (private parts)       with your normal soap.  6.  Wash thoroughly, paying special attention to the area where your surgery        will be performed.  7.  Thoroughly rinse your body with warm water from the neck down.  8.  DO NOT shower/wash with your normal soap after using and rinsing off       the CHG Soap.  9.  Pat yourself dry with a clean towel.            10.  Wear clean pajamas.            11.  Place clean sheets on your bed the night of your first shower and do not        sleep with pets.  Day of Surgery  Do not apply any lotions/deoderants the morning of surgery.  Please wear clean clothes to the hospital/surgery center.

## 2016-03-31 ENCOUNTER — Inpatient Hospital Stay (HOSPITAL_COMMUNITY): Admission: RE | Admit: 2016-03-31 | Payer: Medicaid Other | Source: Ambulatory Visit

## 2016-04-02 ENCOUNTER — Encounter (HOSPITAL_COMMUNITY)
Admission: RE | Admit: 2016-04-02 | Discharge: 2016-04-02 | Disposition: A | Discharge status: Home / Self Care | Payer: Medicaid Other | Source: Ambulatory Visit | Attending: Orthopedic Surgery | Admitting: Orthopedic Surgery

## 2016-04-02 ENCOUNTER — Encounter (HOSPITAL_COMMUNITY): Payer: Self-pay

## 2016-04-02 ENCOUNTER — Other Ambulatory Visit (HOSPITAL_COMMUNITY): Payer: Self-pay | Admitting: *Deleted

## 2016-04-02 DIAGNOSIS — Z01812 Encounter for preprocedural laboratory examination: Secondary | ICD-10-CM | POA: Insufficient documentation

## 2016-04-02 DIAGNOSIS — G5601 Carpal tunnel syndrome, right upper limb: Secondary | ICD-10-CM | POA: Diagnosis not present

## 2016-04-02 DIAGNOSIS — Z01818 Encounter for other preprocedural examination: Secondary | ICD-10-CM | POA: Insufficient documentation

## 2016-04-02 DIAGNOSIS — I10 Essential (primary) hypertension: Secondary | ICD-10-CM | POA: Diagnosis not present

## 2016-04-02 HISTORY — DX: Reserved for inherently not codable concepts without codable children: IMO0001

## 2016-04-02 HISTORY — DX: Headache, unspecified: R51.9

## 2016-04-02 HISTORY — DX: Unspecified osteoarthritis, unspecified site: M19.90

## 2016-04-02 HISTORY — DX: Irritable bowel syndrome, unspecified: K58.9

## 2016-04-02 HISTORY — DX: Other complications of anesthesia, initial encounter: T88.59XA

## 2016-04-02 HISTORY — DX: Family history of other specified conditions: Z84.89

## 2016-04-02 HISTORY — DX: Anemia, unspecified: D64.9

## 2016-04-02 HISTORY — DX: Gastro-esophageal reflux disease without esophagitis: K21.9

## 2016-04-02 HISTORY — DX: Anxiety disorder, unspecified: F41.9

## 2016-04-02 HISTORY — DX: Pneumonia, unspecified organism: J18.9

## 2016-04-02 HISTORY — DX: Depression, unspecified: F32.A

## 2016-04-02 HISTORY — DX: Adverse effect of unspecified anesthetic, initial encounter: T41.45XA

## 2016-04-02 HISTORY — DX: Sleep apnea, unspecified: G47.30

## 2016-04-02 HISTORY — DX: Headache: R51

## 2016-04-02 HISTORY — DX: Major depressive disorder, single episode, unspecified: F32.9

## 2016-04-02 LAB — BASIC METABOLIC PANEL
Anion gap: 6 (ref 5–15)
BUN: 11 mg/dL (ref 6–20)
CHLORIDE: 105 mmol/L (ref 101–111)
CO2: 26 mmol/L (ref 22–32)
CREATININE: 0.9 mg/dL (ref 0.44–1.00)
Calcium: 9.1 mg/dL (ref 8.9–10.3)
GFR calc non Af Amer: >60 mL/min (ref >60)
GLUCOSE: 114 mg/dL — AB (ref 65–99)
Potassium: 3.7 mmol/L (ref 3.5–5.1)
Sodium: 137 mmol/L (ref 135–145)

## 2016-04-02 LAB — CBC
HCT: 45 % (ref 36.0–46.0)
HEMOGLOBIN: 14.9 g/dL (ref 12.0–15.0)
MCH: 29.2 pg (ref 26.0–34.0)
MCHC: 33.1 g/dL (ref 30.0–36.0)
MCV: 88.1 fL (ref 78.0–100.0)
PLATELETS: 314 10*3/uL (ref 150–400)
RBC: 5.11 MIL/uL (ref 3.87–5.11)
RDW: 13.1 % (ref 11.5–15.5)
WBC: 8.8 10*3/uL (ref 4.0–10.5)

## 2016-04-02 LAB — HCG, SERUM, QUALITATIVE: Preg, Serum: NEGATIVE

## 2016-04-02 LAB — GLUCOSE, CAPILLARY: GLUCOSE-CAPILLARY: 111 mg/dL — AB (ref 65–99)

## 2016-04-02 NOTE — Pre-Procedure Instructions (Signed)
Michele Craig  04/02/2016     Your procedure is scheduled on Friday, Apr 04, 2016 at 10:40 AM.   Report to [facility] Entrance "A" Admitting Office at 8:40 AM. .  Call this number if you have problems the morning of surgery: [phone]   Any questions prior to day of surgery, please call [phone] between 8 & 4 PM.   Remember:  Do not eat food or drink liquids after midnight Thursday, 04/03/16.  Take these medicines the morning of surgery with A SIP OF WATER:  Gabapentin, Ranitidine, Advair inhaler - if needed, Albuterol inhaler - if needed, Oxycodone - if needed   Do not wear jewelry, make-up or nail polish.  Do not wear lotions, powders, or perfumes.  You may wear deodorant.  Do not shave 48 hours prior to surgery.    Do not bring valuables to the hospital.  [facility] is not responsible for any belongings or valuables.  Contacts, dentures or bridgework may not be worn into surgery.  Leave your suitcase in the car.  After surgery it may be brought to your room.  For patients admitted to the hospital, discharge time will be determined by your treatment team.  Patients discharged the day of surgery will not be allowed to drive home.   Special instructions:  [name] - Preparing for Surgery  Before surgery, you can play an important role.  Because skin is not sterile, your skin needs to be as free of germs as possible.  You can reduce the number of germs on you skin by washing with CHG (chlorahexidine gluconate) soap before surgery.  CHG is an antiseptic cleaner which kills germs and bonds with the skin to continue killing germs even after washing.  Please DO NOT use if you have an allergy to CHG or antibacterial soaps.  If your skin becomes reddened/irritated stop using the CHG and inform your nurse when you arrive at Short Stay.  Do not shave (including legs and underarms) for at least 48 hours prior to the first CHG shower.  You may shave your face.  Please  follow these instructions carefully:   1.  Shower with CHG Soap the night before surgery and the                                morning of Surgery.  2.  If you choose to wash your hair, wash your hair first as usual with your       normal shampoo.  3.  After you shampoo, rinse your hair and body thoroughly to remove the                      Shampoo.  4.  Use CHG as you would any other liquid soap.  You can apply chg directly       to the skin and wash gently with scrungie or a clean washcloth.  5.  Apply the CHG Soap to your body ONLY FROM THE NECK DOWN.        Do not use on open wounds or open sores.  Avoid contact with your eyes, ears, mouth and genitals (private parts).  Wash genitals (private parts) with your normal soap.  6.  Wash thoroughly, paying special attention to the area where your surgery        will be performed.  7.  Thoroughly rinse your body with  warm water from the neck down.  8.  DO NOT shower/wash with your normal soap after using and rinsing off       the CHG Soap.  9.  Pat yourself dry with a clean towel.            10.  Wear clean pajamas.            11.  Place clean sheets on your bed the night of your first shower and do not        sleep with pets.  Day of Surgery  Do not apply any lotions the morning of surgery.  Please wear clean clothes to the hospital.   Please read over the following fact sheets that you were given. Pain Booklet, Coughing and Deep Breathing and Surgical Site Infection Prevention

## 2016-04-02 NOTE — Pre-Procedure Instructions (Signed)
Michele Craig  04/02/2016     Your procedure is scheduled on Friday, Apr 04, 2016 at 10:40 AM.   Report to [facility] Entrance "A" Admitting Office at 8:40 AM. .  Call this number if you have problems the morning of surgery: [phone]   Any questions prior to day of surgery, please call [phone] between 8 & 4 PM.   Remember:  Do not eat food or drink liquids after midnight Thursday, 04/03/16.  Take these medicines the morning of surgery with A SIP OF WATER: Amlodipine (Norvasc), Omeprazole (Prilosec), Advair inhaler, Oxycodone - if needed, Albuterol inhaler - if needed, please bring with you day of surgery.   Do not wear jewelry, make-up or nail polish.  Do not wear lotions, powders, or perfumes.  You may wear deodorant.  Do not shave 48 hours prior to surgery.    Do not bring valuables to the hospital.  [facility] is not responsible for any belongings or valuables.  Contacts, dentures or bridgework may not be worn into surgery.  Leave your suitcase in the car.  After surgery it may be brought to your room.  For patients admitted to the hospital, discharge time will be determined by your treatment team.  Patients discharged the day of surgery will not be allowed to drive home.   Special instructions:  [place] - Preparing for Surgery  Before surgery, you can play an important role.  Because skin is not sterile, your skin needs to be as free of germs as possible.  You can reduce the number of germs on you skin by washing with CHG (chlorahexidine gluconate) soap before surgery.  CHG is an antiseptic cleaner which kills germs and bonds with the skin to continue killing germs even after washing.  Please DO NOT use if you have an allergy to CHG or antibacterial soaps.  If your skin becomes reddened/irritated stop using the CHG and inform your nurse when you arrive at Short Stay.  Do not shave (including legs and underarms) for at least 48 hours prior to the first  CHG shower.  You may shave your face.  Please follow these instructions carefully:   1.  Shower with CHG Soap the night before surgery and the                                morning of Surgery.  2.  If you choose to wash your hair, wash your hair first as usual with your       normal shampoo.  3.  After you shampoo, rinse your hair and body thoroughly to remove the                      Shampoo.  4.  Use CHG as you would any other liquid soap.  You can apply chg directly       to the skin and wash gently with scrungie or a clean washcloth.  5.  Apply the CHG Soap to your body ONLY FROM THE NECK DOWN.        Do not use on open wounds or open sores.  Avoid contact with your eyes, ears, mouth and genitals (private parts).  Wash genitals (private parts) with your normal soap.  6.  Wash thoroughly, paying special attention to the area where your surgery        will be performed.  7.  Thoroughly rinse your body with warm water from the neck down.  8.  DO NOT shower/wash with your normal soap after using and rinsing off       the CHG Soap.  9.  Pat yourself dry with a clean towel.            10.  Wear clean pajamas.            11.  Place clean sheets on your bed the night of your first shower and do not        sleep with pets.  Day of Surgery  Do not apply any lotions the morning of surgery.  Please wear clean clothes to the hospital.   Please read over the following fact sheets that you were given. Pain Booklet, Coughing and Deep Breathing and Surgical Site Infection Prevention

## 2016-04-02 NOTE — Progress Notes (Signed)
Pt denies cardiac history, chest pain or sob. Pt does have history of diabetes, but has not been on medication since her Gastric Sleeve surgery in 2015. States she checks her fasting blood sugar occasionally and it is usually around 95. CBG today is 111. She states she can't remember when she last had an A1C done.

## 2016-04-03 ENCOUNTER — Encounter (HOSPITAL_COMMUNITY): Payer: Self-pay | Admitting: Anesthesiology

## 2016-04-03 LAB — HEMOGLOBIN A1C
Hgb A1c MFr Bld: 6.6 % — ABNORMAL HIGH (ref 4.8–5.6)
Mean Plasma Glucose: 143 mg/dL

## 2016-04-03 MED ORDER — VANCOMYCIN HCL 10 G IV SOLR
1500.0000 mg | INTRAVENOUS | Status: AC
  Administered 2016-04-04: 1500 mg via INTRAVENOUS
  Filled 2016-04-03: qty 1500

## 2016-04-03 NOTE — Anesthesia Preprocedure Evaluation (Addendum)
Anesthesia Evaluation  Patient identified by MRN, date of birth, ID band Patient awake    Reviewed: Allergy & Precautions, NPO status   History of Anesthesia Complications (+) Family history of anesthesia reaction and history of anesthetic complications  Airway Mallampati: II  TM Distance: >3 FB Neck ROM: Full    Dental  (+) Chipped,    Pulmonary shortness of breath and with exertion, asthma , sleep apnea , pneumonia, resolved,    Pulmonary exam normal breath sounds clear to auscultation       Cardiovascular hypertension, Pt. on medications Normal cardiovascular exam Rhythm:Regular Rate:Normal     Neuro/Psych  Headaches, PSYCHIATRIC DISORDERS Anxiety Depression  Neuromuscular disease    GI/Hepatic Neg liver ROS, GERD  Medicated and Controlled,S/P Gastric bypass IBS   Endo/Other  diabetes, Poorly Controlled, Type 2, Oral Hypoglycemic AgentsMorbid obesity  Renal/GU negative Renal ROS  negative genitourinary   Musculoskeletal  (+) Arthritis , Fibromyalgia -Right carpal tunnel syndrome   Abdominal (+) + obese,   Peds  Hematology  (+) anemia ,   Anesthesia Other Findings   Reproductive/Obstetrics                          Anesthesia Physical Anesthesia Plan  ASA: III  Anesthesia Plan: General   Post-op Pain Management:    Induction: Intravenous  Airway Management Planned: LMA  Additional Equipment:   Intra-op Plan:   Post-operative Plan: Extubation in OR  Informed Consent: I have reviewed the patients History and Physical, chart, labs and discussed the procedure including the risks, benefits and alternatives for the proposed anesthesia with the patient or authorized representative who has indicated his/her understanding and acceptance.   Dental advisory given  Plan Discussed with: CRNA, Anesthesiologist and Surgeon  Anesthesia Plan Comments:        Anesthesia Quick  Evaluation

## 2016-04-04 ENCOUNTER — Ambulatory Visit (HOSPITAL_COMMUNITY)
Admission: RE | Admit: 2016-04-04 | Discharge: 2016-04-04 | Disposition: A | Discharge status: Home / Self Care | Payer: Medicaid Other | Source: Ambulatory Visit | Attending: Orthopedic Surgery | Admitting: Orthopedic Surgery

## 2016-04-04 ENCOUNTER — Ambulatory Visit (HOSPITAL_COMMUNITY): Payer: Medicaid Other | Admitting: Anesthesiology

## 2016-04-04 ENCOUNTER — Encounter (HOSPITAL_COMMUNITY)
Admission: RE | Disposition: A | Discharge status: Home / Self Care | Payer: Self-pay | Source: Ambulatory Visit | Attending: Orthopedic Surgery

## 2016-04-04 ENCOUNTER — Encounter (HOSPITAL_COMMUNITY): Payer: Self-pay | Admitting: Surgery

## 2016-04-04 DIAGNOSIS — M659 Synovitis and tenosynovitis, unspecified: Secondary | ICD-10-CM | POA: Diagnosis not present

## 2016-04-04 DIAGNOSIS — Z7984 Long term (current) use of oral hypoglycemic drugs: Secondary | ICD-10-CM | POA: Diagnosis not present

## 2016-04-04 DIAGNOSIS — G5601 Carpal tunnel syndrome, right upper limb: Secondary | ICD-10-CM | POA: Diagnosis present

## 2016-04-04 DIAGNOSIS — M797 Fibromyalgia: Secondary | ICD-10-CM | POA: Insufficient documentation

## 2016-04-04 DIAGNOSIS — Z79899 Other long term (current) drug therapy: Secondary | ICD-10-CM | POA: Diagnosis not present

## 2016-04-04 DIAGNOSIS — E119 Type 2 diabetes mellitus without complications: Secondary | ICD-10-CM | POA: Diagnosis not present

## 2016-04-04 DIAGNOSIS — F418 Other specified anxiety disorders: Secondary | ICD-10-CM | POA: Insufficient documentation

## 2016-04-04 DIAGNOSIS — J45909 Unspecified asthma, uncomplicated: Secondary | ICD-10-CM | POA: Insufficient documentation

## 2016-04-04 DIAGNOSIS — Z791 Long term (current) use of non-steroidal anti-inflammatories (NSAID): Secondary | ICD-10-CM | POA: Insufficient documentation

## 2016-04-04 DIAGNOSIS — Z9884 Bariatric surgery status: Secondary | ICD-10-CM | POA: Insufficient documentation

## 2016-04-04 DIAGNOSIS — I10 Essential (primary) hypertension: Secondary | ICD-10-CM | POA: Diagnosis not present

## 2016-04-04 DIAGNOSIS — Z6841 Body Mass Index (BMI) 40.0 and over, adult: Secondary | ICD-10-CM | POA: Diagnosis not present

## 2016-04-04 HISTORY — PX: CARPAL TUNNEL RELEASE: SHX101

## 2016-04-04 LAB — GLUCOSE, CAPILLARY
GLUCOSE-CAPILLARY: 99 mg/dL (ref 65–99)
GLUCOSE-CAPILLARY: 83 mg/dL (ref 65–99)

## 2016-04-04 SURGERY — CARPAL TUNNEL RELEASE
Anesthesia: General | Site: Wrist | Laterality: Right

## 2016-04-04 MED ORDER — PROPOFOL 1000 MG/100ML IV EMUL
INTRAVENOUS | Status: AC
  Filled 2016-04-04: qty 200

## 2016-04-04 MED ORDER — METOCLOPRAMIDE HCL 5 MG/ML IJ SOLN: 10.0000 mg | Freq: Once | INTRAMUSCULAR | Status: DC | PRN

## 2016-04-04 MED ORDER — FENTANYL CITRATE (PF) 100 MCG/2ML IJ SOLN
INTRAMUSCULAR | Status: AC
  Administered 2016-04-04: 50 ug via INTRAVENOUS
  Filled 2016-04-04: qty 2

## 2016-04-04 MED ORDER — FENTANYL CITRATE (PF) 100 MCG/2ML IJ SOLN
25.0000 ug | INTRAMUSCULAR | Status: DC | PRN
  Administered 2016-04-04 (×2): 25 ug via INTRAVENOUS
  Administered 2016-04-04: 50 ug via INTRAVENOUS

## 2016-04-04 MED ORDER — LACTATED RINGERS IV SOLN
INTRAVENOUS | Status: DC | PRN
  Administered 2016-04-04: 07:00:00 via INTRAVENOUS

## 2016-04-04 MED ORDER — FENTANYL CITRATE (PF) 250 MCG/5ML IJ SOLN
INTRAMUSCULAR | Status: AC
  Filled 2016-04-04: qty 5

## 2016-04-04 MED ORDER — FENTANYL CITRATE (PF) 100 MCG/2ML IJ SOLN
INTRAMUSCULAR | Status: DC | PRN
  Administered 2016-04-04: 50 ug via INTRAVENOUS
  Administered 2016-04-04 (×2): 25 ug via INTRAVENOUS
  Administered 2016-04-04: 50 ug via INTRAVENOUS

## 2016-04-04 MED ORDER — BUPIVACAINE HCL 0.5 % IJ SOLN
INTRAMUSCULAR | Status: DC | PRN
  Administered 2016-04-04: 10 mL

## 2016-04-04 MED ORDER — FENTANYL CITRATE (PF) 100 MCG/2ML IJ SOLN: 25.0000 ug | INTRAMUSCULAR | Status: DC | PRN

## 2016-04-04 MED ORDER — BUPIVACAINE HCL (PF) 0.25 % IJ SOLN
INTRAMUSCULAR | Status: AC
  Filled 2016-04-04: qty 30

## 2016-04-04 MED ORDER — ARTIFICIAL TEARS OP OINT
TOPICAL_OINTMENT | OPHTHALMIC | Status: AC
  Filled 2016-04-04: qty 3.5

## 2016-04-04 MED ORDER — ONDANSETRON HCL 4 MG/2ML IJ SOLN
INTRAMUSCULAR | Status: AC
  Filled 2016-04-04: qty 2

## 2016-04-04 MED ORDER — ONDANSETRON HCL 4 MG/2ML IJ SOLN
INTRAMUSCULAR | Status: DC | PRN
  Administered 2016-04-04: 4 mg via INTRAVENOUS

## 2016-04-04 MED ORDER — PROPOFOL 10 MG/ML IV BOLUS
INTRAVENOUS | Status: DC | PRN
  Administered 2016-04-04: 300 mg via INTRAVENOUS
  Administered 2016-04-04: 50 mg via INTRAVENOUS

## 2016-04-04 MED ORDER — HYDROCODONE-ACETAMINOPHEN 7.5-325 MG PO TABS: 1.0000 | ORAL_TABLET | Freq: Once | ORAL | Status: DC | PRN

## 2016-04-04 MED ORDER — PROPOFOL 500 MG/50ML IV EMUL
INTRAVENOUS | Status: AC
  Filled 2016-04-04: qty 50

## 2016-04-04 MED ORDER — CHLORHEXIDINE GLUCONATE 4 % EX LIQD: 60.0000 mL | Freq: Once | CUTANEOUS | Status: DC

## 2016-04-04 MED ORDER — SODIUM CHLORIDE 0.9 % IV SOLN: INTRAVENOUS | Status: DC

## 2016-04-04 MED ORDER — LIDOCAINE HCL (PF) 1 % IJ SOLN
INTRAMUSCULAR | Status: AC
  Filled 2016-04-04: qty 30

## 2016-04-04 MED ORDER — PROPOFOL 10 MG/ML IV BOLUS
INTRAVENOUS | Status: AC
  Filled 2016-04-04: qty 40

## 2016-04-04 MED ORDER — LIDOCAINE HCL (CARDIAC) 20 MG/ML IV SOLN
INTRAVENOUS | Status: DC | PRN
  Administered 2016-04-04: 100 mg via INTRAVENOUS

## 2016-04-04 MED ORDER — MIDAZOLAM HCL 5 MG/5ML IJ SOLN
INTRAMUSCULAR | Status: DC | PRN
  Administered 2016-04-04: 2 mg via INTRAVENOUS

## 2016-04-04 MED ORDER — MEPERIDINE HCL 25 MG/ML IJ SOLN: 6.2500 mg | INTRAMUSCULAR | Status: DC | PRN

## 2016-04-04 MED ORDER — MIDAZOLAM HCL 2 MG/2ML IJ SOLN
INTRAMUSCULAR | Status: AC
  Filled 2016-04-04: qty 2

## 2016-04-04 SURGICAL SUPPLY — 30 items
BANDAGE ELASTIC 3 VELCRO ST LF (GAUZE/BANDAGES/DRESSINGS) ×3 IMPLANT
BNDG CMPR 9X4 STRL LF SNTH (GAUZE/BANDAGES/DRESSINGS) ×1
BNDG CONFORM 3 STRL LF (GAUZE/BANDAGES/DRESSINGS) ×3 IMPLANT
BNDG ESMARK 4X9 LF (GAUZE/BANDAGES/DRESSINGS) ×3 IMPLANT
CORDS BIPOLAR (ELECTRODE) ×3 IMPLANT
COVER SURGICAL LIGHT HANDLE (MISCELLANEOUS) ×3 IMPLANT
CUFF TOURNIQUET SINGLE 18IN (TOURNIQUET CUFF) ×3 IMPLANT
CUFF TOURNIQUET SINGLE 24IN (TOURNIQUET CUFF) IMPLANT
DRSG EMULSION OIL 3X3 NADH (GAUZE/BANDAGES/DRESSINGS) ×3 IMPLANT
DURAPREP 26ML APPLICATOR (WOUND CARE) ×3 IMPLANT
GAUZE SPONGE 4X4 12PLY STRL (GAUZE/BANDAGES/DRESSINGS) ×3 IMPLANT
GLOVE BIOGEL PI IND STRL 8 (GLOVE) ×2 IMPLANT
GLOVE BIOGEL PI INDICATOR 8 (GLOVE) ×4
GLOVE ORTHO TXT STRL SZ7.5 (GLOVE) ×3 IMPLANT
GLOVE SURG ORTHO 8.0 STRL STRW (GLOVE) ×3 IMPLANT
GOWN STRL REUS W/ TWL LRG LVL3 (GOWN DISPOSABLE) ×1 IMPLANT
GOWN STRL REUS W/ TWL XL LVL3 (GOWN DISPOSABLE) ×2 IMPLANT
GOWN STRL REUS W/TWL LRG LVL3 (GOWN DISPOSABLE) ×3
GOWN STRL REUS W/TWL XL LVL3 (GOWN DISPOSABLE) ×6
KIT BASIN OR (CUSTOM PROCEDURE TRAY) ×3 IMPLANT
KIT ROOM TURNOVER OR (KITS) ×3 IMPLANT
NDL HYPO 25X1 1.5 SAFETY (NEEDLE) IMPLANT
NEEDLE HYPO 25X1 1.5 SAFETY (NEEDLE) IMPLANT
NS IRRIG 1000ML POUR BTL (IV SOLUTION) ×3 IMPLANT
PACK ORTHO EXTREMITY (CUSTOM PROCEDURE TRAY) ×3 IMPLANT
PAD ARMBOARD 7.5X6 YLW CONV (MISCELLANEOUS) ×3 IMPLANT
SUT ETHILON 4 0 PS 2 18 (SUTURE) ×3 IMPLANT
SYR CONTROL 10ML LL (SYRINGE) IMPLANT
TOWEL OR 17X24 6PK STRL BLUE (TOWEL DISPOSABLE) ×3 IMPLANT
UNDERPAD 30X30 INCONTINENT (UNDERPADS AND DIAPERS) ×3 IMPLANT

## 2016-04-04 NOTE — Transfer of Care (Signed)
Immediate Anesthesia Transfer of Care Note  Patient: Michele Craig  Procedure(s) Performed: Procedure(s) with comments: RIGHT CARPAL TUNNEL RELEASE (Right) - Synovectomy   Patient Location: PACU  Anesthesia Type:General  Level of Consciousness: awake, alert  and oriented  Airway & Oxygen Therapy: Patient Spontanous Breathing and Patient connected to nasal cannula oxygen  Post-op Assessment: Report given to RN and Post -op Vital signs reviewed and stable  Post vital signs: Reviewed and stable  Last Vitals:  Filed Vitals:   04/04/16 0555  BP: 122/71  Pulse: 86  Temp: 37.1 C  Resp: 20    Last Pain:  Filed Vitals:   04/04/16 0829  PainSc: 8          Complications: No apparent anesthesia complications

## 2016-04-04 NOTE — Discharge Instructions (Signed)
Diet: As you were doing prior to hospitalization   Activity: Increase activity slowly as tolerated  No lifting or driving for 24 hours  Shower: May shower without a dressing on post op day #2, NO SOAKING in tub   Dressing: You may change your dressing on post op day #2.  Then change the dressing daily with sterile 4"x4"s gauze dressing   To prevent constipation: you may use a stool softener such as -  Colace ( over the counter) 100 mg by mouth twice a day  Drink plenty of fluids ( prune juice may be helpful) and high fiber foods  Miralax ( over the counter) for constipation as needed.   Precautions: If you experience chest pain or shortness of breath - call 911 immediately For transfer to the hospital emergency department!!  If you develop a fever greater that 101 F, purulent drainage from wound, increased redness or drainage from wound, or calf pain -- Call the office   Follow- Up Appointment: Please call for an appointment to be seen in 2 weeks  [place] - [phone]

## 2016-04-04 NOTE — Anesthesia Postprocedure Evaluation (Signed)
Anesthesia Post Note  Patient: Michele Craig  Procedure(s) Performed: Procedure(s) (LRB): RIGHT CARPAL TUNNEL RELEASE (Right)  Patient location during evaluation: PACU Anesthesia Type: General Level of consciousness: awake and alert and oriented Pain management: pain level controlled Vital Signs Assessment: post-procedure vital signs reviewed and stable Respiratory status: spontaneous breathing, nonlabored ventilation and respiratory function stable Cardiovascular status: blood pressure returned to baseline and stable Postop Assessment: no signs of nausea or vomiting Anesthetic complications: no    Last Vitals:  Filed Vitals:   04/04/16 0841 04/04/16 0856  BP: 143/76 146/82  Pulse: 104 100  Temp:    Resp: 15 16    Last Pain:  Filed Vitals:   04/04/16 0903  PainSc: 10-Worst pain ever                 Michele Senger A.

## 2016-04-04 NOTE — Interval H&P Note (Signed)
History and Physical Interval Note:  04/04/2016 7:27 AM  Michele Craig  has presented today for surgery, with the diagnosis of carpal tunnel syndrome, right upper limb   G56.01  The various methods of treatment have been discussed with the patient and family. After consideration of risks, benefits and other options for treatment, the patient has consented to  Procedure(s): RIGHT CARPAL TUNNEL RELEASE (Right) as a surgical intervention .  The patient's history has been reviewed, patient examined, no change in status, stable for surgery.  I have reviewed the patient's chart and labs.  Questions were answered to the patient's satisfaction.     Dayshaun Whobrey JR,W D

## 2016-04-04 NOTE — Anesthesia Procedure Notes (Signed)
Procedure Name: LMA Insertion Date/Time: 04/04/2016 7:39 AM Performed by: Margaree Mackintosh Pre-anesthesia Checklist: Patient identified, Emergency Drugs available, Suction available, Patient being monitored and Timeout performed Patient Re-evaluated:Patient Re-evaluated prior to inductionOxygen Delivery Method: Circle system utilized Preoxygenation: Pre-oxygenation with 100% oxygen Intubation Type: IV induction LMA: LMA inserted LMA Size: 5.0 Number of attempts: 1 Placement Confirmation: positive ETCO2 and breath sounds checked- equal and bilateral Tube secured with: Tape Dental Injury: Teeth and Oropharynx as per pre-operative assessment

## 2016-04-04 NOTE — H&P (View-Only) (Signed)
Michele Craig is an 45 y.o. female.   Chief Complaint: right carpal tunnel  HPI: Insidious onset of right wrist and hand pain.  Previous carpal tunnel release greater than 5 years ago.  She works from home, on Disability.  She takes Cymbalta, Zyrtec.   She is 45, 5'7", 385 pounds. BMI calculated at 60.  She does have significant sleep apnea.  She goes to [facility] for her medical needs.   This is an initial, non-traumatic, chronic condition.  ALLERGIES TO SHRIMP AND PENICILLIN.    Previous surgery for the carpal tunnel release on the left.  Two C -sections.  Gastric sleeve in July 2015.  History of sleep apnea, fibromyalgia, depression, acid reflux, anxiety, asthma, and high blood pressure.    We obtained NCV which shows severe medial nerve impingement at the wrist on the right.    Past Medical History  Diagnosis Date  . Asthma   . Hypertension   . Diabetes mellitus   . Fibromyalgia     Past Surgical History  Procedure Laterality Date  . Cesarean section    . Cholecystectomy    . Tonsillectomy    . Carpal tunnel release    . Dilation and curettage of uterus      No family history on file. Social History:  reports that she has never smoked. She has never used smokeless tobacco. She reports that she does not drink alcohol or use illicit drugs.  Allergies:  Allergies  Allergen Reactions  . Shrimp [Shellfish Allergy] Anaphylaxis  . Penicillins Hives  . Zosyn [Piperacillin Sod-Tazobactam So] Hives and Itching     (Not in a hospital admission)  No results found for this or any previous visit (from the past 48 hour(s)). No results found.  Review of Systems  Gastrointestinal: Positive for heartburn.  Musculoskeletal: Positive for myalgias.  Neurological: Positive for tingling.  Psychiatric/Behavioral: Positive for depression. The patient is nervous/anxious.   All other systems reviewed and are negative.   There were no vitals taken for this  visit. Physical Exam  Constitutional: She is oriented to person, place, and time. She appears well-developed and well-nourished. No distress.  Morbidly obese  HENT:  Head: Normocephalic and atraumatic.  Nose: Nose normal.  Eyes: Conjunctivae and EOM are normal. Pupils are equal, round, and reactive to light.  Neck: Normal range of motion. Neck supple.  Cardiovascular: Normal rate and intact distal pulses.   Respiratory: Effort normal. No respiratory distress.  GI: Soft. She exhibits no distension. There is no tenderness.  Musculoskeletal:       Right wrist: She exhibits tenderness.  Positive tinels, and phalens  Neurological: She is alert and oriented to person, place, and time.  Skin: Skin is warm and dry. No rash noted. No erythema.  Psychiatric: She has a normal mood and affect. Her behavior is normal.     Assessment/Plan Right carpal tunnel syndrome  Discussed risks and benefits of right carpal tunnel release and she wishes to proceed.  May need to be done in main hospital due to high BMI. Of note she had a poor experience with regional anesthesia but this was years ago elsewhere.   Margart Sickles, PA-C 03/14/2016, 10:05 AM

## 2016-04-06 NOTE — Op Note (Signed)
NAME:  Michele Craig, Michele Craig                ACCOUNT NO.:  [id number]  MEDICAL RECORD NO.:  [id number]  LOCATION:  [facility]                         FACILITY:  [facility]  PHYSICIAN:  Dyke Brackett, M.D.    DATE OF BIRTH:  [birth year]  DATE OF PROCEDURE:  04/04/2016 DATE OF DISCHARGE:  04/04/2016                              OPERATIVE REPORT   PREOPERATIVE DIAGNOSIS:  Right carpal tunnel syndrome with synovitis.  POSTOPERATIVE DIAGNOSIS:  Right carpal tunnel syndrome with synovitis.  OPERATION: 1. Right carpal tunnel release. 2. Synovectomy all for the right wrist.  SURGEON:  Dyke Brackett, M.D.  ASSISTANT:  None applicable.  ANESTHESIA:  General with local.  TOURNIQUET TIME:  Approximately 25 minutes.  DESCRIPTION OF PROCEDURE:  Supine positioning, exsanguination of the forearm tourniquet.  Curvilinear incision made on the thenar crease.  We dissected down to the transverse carpal ligament distally to level superficial arch proximally including the antebrachial fascia of the wrist.  Nonspecific synovitis was encountered, performed a localized synovectomy at the wrist level.  Wound was irrigated, closed with nylon, wound was infiltrated with 10 mL of 0.25% Marcaine plain.  Lightly compressive sterile dressing applied.  Taken to recovery room in stable in condition stational condition.     Dyke Brackett, M.D.     WDC/MEDQ  D:  04/05/2016  T:  04/06/2016  Job:  [id number]

## 2016-04-07 ENCOUNTER — Encounter (HOSPITAL_COMMUNITY): Payer: Self-pay | Admitting: Orthopedic Surgery

## 2016-04-07 NOTE — Op Note (Signed)
NAME:  Michele Craig, Michele Craig                ACCOUNT NO.:  [id number]  MEDICAL RECORD NO.:  [id number]  LOCATION:                                 FACILITY:  PHYSICIAN:  Dyke Brackett, M.D.         DATE OF BIRTH:  DATE OF PROCEDURE:  04/04/2016 DATE OF DISCHARGE:                              OPERATIVE REPORT   PREOPERATIVE DIAGNOSES: 1. Right carpal tunnel syndrome. 2. Synovitis, right wrist.  POSTOPERATIVE DIAGNOSES: 1. Right carpal tunnel syndrome. 2. Synovitis, right wrist.  OPERATION: 1. Right carpal tunnel release. 2. Synovectomy right wrist.  SURGEON:  Dyke Brackett, M.D.  ANESTHESIA:  General anesthetic with local supplementation.  DESCRIPTION OF PROCEDURE:  General anesthetic, supine position, inflation performed, tourniquet 250.  Curvilinear incision was made just ulnar to the thenar crease.  Dissection carried down on the ulnar side, transverse carpal ligament.  We dissected on the ulnar side of the ligament distally to the superficial arch proximally to include the antebrachial fascia of the wrist.  Nonspecific synovitis was encountered which was debrided as well in the wrist area.  Wound was irrigated and closed with interrupted 4-0 nylon.  Infiltrated with 10 mL 0.25% Marcaine.  Lightly compressive sterile dressing applied.  Taken to recovery in stable condition.  The tourniquet was released after application of the dressings.     Dyke Brackett, M.D.     WDC/MEDQ  D:  04/04/2016  T:  04/05/2016  Job:  [id number]

## 2016-11-17 ENCOUNTER — Ambulatory Visit: Payer: Medicaid Other | Admitting: Family Medicine

## 2016-12-02 ENCOUNTER — Ambulatory Visit: Payer: Medicaid Other | Admitting: Obstetrics & Gynecology

## 2016-12-17 ENCOUNTER — Ambulatory Visit: Payer: Medicaid Other | Admitting: Obstetrics & Gynecology

## 2017-01-06 ENCOUNTER — Ambulatory Visit: Payer: Medicaid Other | Admitting: Obstetrics and Gynecology

## 2017-01-22 ENCOUNTER — Encounter: Payer: Self-pay | Admitting: Obstetrics and Gynecology

## 2017-01-22 ENCOUNTER — Ambulatory Visit (INDEPENDENT_AMBULATORY_CARE_PROVIDER_SITE_OTHER): Payer: Medicaid Other | Admitting: Obstetrics and Gynecology

## 2017-01-22 DIAGNOSIS — N939 Abnormal uterine and vaginal bleeding, unspecified: Secondary | ICD-10-CM | POA: Diagnosis not present

## 2017-01-22 DIAGNOSIS — Z975 Presence of (intrauterine) contraceptive device: Secondary | ICD-10-CM

## 2017-01-22 NOTE — Progress Notes (Signed)
Patient presents for Hysterectomy Consult. She had irregular periods and got the Mirena on 03/11/2016, this helped with the periods.  Last PAP 04/2016.  Request for Records faxed to [facility]-Dr.Roberts. 01/22/17

## 2017-01-22 NOTE — Progress Notes (Signed)
Patient ID: [place] B Gatton, female   DOB: [birth year], 46 y.o.   MRN: [id number]  Ms Diamico presents today for follow up of her AUB. Pt reports H/O heavy irregular cycle sin the past. Underwent endometrial ablation in 2012. This helped with her cycles for awhile. She had a Mirena IUD placed May 2017. Had some problems with bleeding a few months afterwards. Treated with provera. She now has had no cycle since August of 2017.  She reports doing well with the Mirena except for some PMS at times. Sexual active without problems.  PE AF VSS Lungs clear Heart RRR Abd soft + BS obese  A/P Mirena IUD        AUB  Pt Sx are well controlled with Mirena IUD. She asked about hysterectomy. Discussed with pt that there is presently no need for surgery. She verbalized understanding. F/U PRN

## 2017-09-27 ENCOUNTER — Encounter (HOSPITAL_COMMUNITY): Payer: Self-pay | Admitting: Emergency Medicine

## 2017-09-27 ENCOUNTER — Emergency Department (HOSPITAL_COMMUNITY): Payer: Medicaid Other

## 2017-09-27 ENCOUNTER — Emergency Department (HOSPITAL_COMMUNITY)
Admission: EM | Admit: 2017-09-27 | Discharge: 2017-09-27 | Disposition: A | Discharge status: Home / Self Care | Payer: Medicaid Other | Attending: Emergency Medicine | Admitting: Emergency Medicine

## 2017-09-27 ENCOUNTER — Other Ambulatory Visit: Payer: Self-pay

## 2017-09-27 DIAGNOSIS — J45909 Unspecified asthma, uncomplicated: Secondary | ICD-10-CM | POA: Insufficient documentation

## 2017-09-27 DIAGNOSIS — Z7984 Long term (current) use of oral hypoglycemic drugs: Secondary | ICD-10-CM | POA: Insufficient documentation

## 2017-09-27 DIAGNOSIS — M722 Plantar fascial fibromatosis: Secondary | ICD-10-CM | POA: Diagnosis not present

## 2017-09-27 DIAGNOSIS — Z79899 Other long term (current) drug therapy: Secondary | ICD-10-CM | POA: Diagnosis not present

## 2017-09-27 DIAGNOSIS — E119 Type 2 diabetes mellitus without complications: Secondary | ICD-10-CM | POA: Insufficient documentation

## 2017-09-27 DIAGNOSIS — M79671 Pain in right foot: Secondary | ICD-10-CM | POA: Diagnosis present

## 2017-09-27 DIAGNOSIS — J302 Other seasonal allergic rhinitis: Secondary | ICD-10-CM | POA: Diagnosis not present

## 2017-09-27 DIAGNOSIS — I10 Essential (primary) hypertension: Secondary | ICD-10-CM | POA: Insufficient documentation

## 2017-09-27 HISTORY — DX: Pain in unspecified foot: M79.673

## 2017-09-27 NOTE — Discharge Instructions (Addendum)
You were seen in the emergency department today for right foot pain. Your X-ray was negative for any fractures or dislocations. As discussed go to your local pharmacy and purchase heel cup inserts. Place these in your tennis shoes and wear them whenever walking around. Try to stretch with the exercises that I have attached to your discharge summary.   Continue to take your prescribed Meloxicam and supplement with Tylenol as needed for pain. Apply ice and elevate the foot when resting.   Follow up with the podiatrist provided within 1 week for further evaluation if your symptoms have not improved. You will need to call and make an appointment. Return to the emergency department for any new or worsening symptoms.

## 2017-09-27 NOTE — ED Provider Notes (Signed)
[facility]-EMERGENCY DEPT Provider Note   CSN: [id number] Arrival date & time: 09/27/17  1238     History   Chief Complaint Chief Complaint  Patient presents with  . Foot Pain    HPI Michele Craig is a 46 y.o. female with a hx of T2DM presents with complaint of acute on chronic R foot pain for the past 1 month. Patient states she has had problems with her R foot over the past few years intermittently with an achy pain. About 1 month ago patient stepped up onto a platform with the distal half of the foot and felt a sharp pain through her foot up into her calf. States she has had continued worsened pain since the incident. Describes the pain as achy to plantar surface of the foot extending to lower calf area- worse when waking up in the morning and worse with activity. She was seen at an urgent care and prescribed Tramadol and Prednisone which she took with minimal relief. Denies numbness, weakness, tingling, fever, or chills.   HPI  Past Medical History:  Diagnosis Date  . Anemia   . Anxiety   . Arthritis   . Asthma   . Complication of anesthesia    with carpal tunnel syndrome the block didn't work well, she could feel things during the surgery  . Depression   . Diabetes mellitus    pt states after her gastric sleeve procedure she is no longer on medications and is not checked for it  . Family history of adverse reaction to anesthesia    unknown, pt is adopted  . Fibromyalgia   . Foot pain   . GERD (gastroesophageal reflux disease)   . Headache   . Hypertension   . IBS (irritable bowel syndrome)    after cholecystectomy and then it went away after Gastric sleeve  . Pneumonia   . Shortness of breath dyspnea    only when her asthma is flaring up  . Sleep apnea    does not use cpap    Patient Active Problem List   Diagnosis Date Noted  . IUD (intrauterine device) in place 01/22/2017  . Abnormal uterine bleeding (AUB) 01/22/2017  . Obstructive  sleep apnea 03/08/2013  . Morbid obesity (HCC) 04/01/2012  . HTN (hypertension) 04/01/2012  . DM (diabetes mellitus) (HCC) 04/01/2012  . Allergic rhinitis 04/01/2012    Past Surgical History:  Procedure Laterality Date  . CARPAL TUNNEL RELEASE    . CESAREAN SECTION     x 2  . CHOLECYSTECTOMY    . DILATION AND CURETTAGE OF UTERUS    . LAPAROSCOPIC GASTRIC SLEEVE RESECTION    . RIGHT CARPAL TUNNEL RELEASE Right 04/04/2016   Performed by Frederico Hammanaffrey, Daniel[facility]  . TONSILLECTOMY      OB History    No data available       Home Medications    Prior to Admission medications   Medication Sig Start Date End Date Taking? Authorizing Provider  ADVAIR DISKUS 250-50 MCG/DOSE AEPB Inhale 1 puff into the lungs 2 (two) times daily.  05/17/14   [provider]  albuterol (PROVENTIL HFA;VENTOLIN HFA) 108 (90 BASE) MCG/ACT inhaler Inhale 2 puffs into the lungs every 6 (six) hours as needed for wheezing or shortness of breath.     [provider]  amLODipine (NORVASC) 10 MG tablet Take 10 mg by mouth every morning.     [provider]  atorvastatin (LIPITOR) 20 MG tablet  Take 20 mg by mouth every evening.  05/14/14   [provider]  cetirizine (ZYRTEC) 10 MG tablet Take 10 mg by mouth daily as needed for allergies.     [provider]  cyclobenzaprine (FLEXERIL) 5 MG tablet Take 5 mg by mouth 3 (three) times daily as needed for muscle spasms.    [provider]  DULoxetine (CYMBALTA) 60 MG capsule Take 60 mg by mouth every evening.     [provider]  furosemide (LASIX) 40 MG tablet Take 60 mg by mouth every morning.  03/08/14   [provider]  gabapentin (NEURONTIN) 100 MG capsule Take 300 mg by mouth 3 (three) times daily.    [provider]  hydrochlorothiazide (HYDRODIURIL) 12.5 MG tablet Take 12.5 mg by mouth daily.    [provider]  meloxicam (MOBIC) 15 MG tablet Take 15 mg by mouth daily.     [provider]  metFORMIN (GLUCOPHAGE) 500 MG tablet Take 500 mg by mouth 2 (two) times daily with a meal.     [provider]  omeprazole (PRILOSEC OTC) 20 MG tablet Take 20 mg by mouth daily.    [provider]  oxyCODONE-acetaminophen (PERCOCET/ROXICET) 5-325 MG per tablet Take 1-2 tablets by mouth every 6 (six) hours as needed for moderate pain or severe pain. 05/21/14   Lurene Shadow, PA-C  ranitidine (ZANTAC) 150 MG tablet Take 150 mg by mouth 2 (two) times daily.    [provider]  Vitamin D, Ergocalciferol, (DRISDOL) 50000 UNITS CAPS capsule Take 50,000 Units by mouth every Saturday.  05/19/14   [provider]  zolpidem (AMBIEN) 10 MG tablet Take 10 mg by mouth at bedtime.    [provider]    Family History Family History  Adopted: Yes  Family history unknown: Yes    Social History Social History   Tobacco Use  . Smoking status: Never Smoker  . Smokeless tobacco: Never Used  Substance Use Topics  . Alcohol use: No  . Drug use: No     Allergies   Shrimp [shellfish allergy]; Penicillins; and Zosyn [piperacillin sod-tazobactam so]   Review of Systems Review of Systems  Constitutional: Negative for chills and fever.  Gastrointestinal: Negative for nausea and vomiting.  Musculoskeletal: Positive for myalgias (R foot and calf).  Skin: Negative for rash.  Neurological: Negative for weakness and numbness.     Physical Exam Updated Vital Signs BP (!) 144/85 (BP Location: Right Arm)   Pulse 88   Temp 98 F (36.7 C) (Oral)   Resp 20   Wt (!) 186 kg (410 lb)   LMP  (Approximate)   SpO2 99%   BMI 64.22 kg/m   Physical Exam  Constitutional: She appears well-developed and well-nourished. No distress.  HENT:  Head: Normocephalic and atraumatic.  Eyes: Conjunctivae are normal. Right eye exhibits no discharge. Left eye exhibits no discharge.  Cardiovascular:  Pulses:      Dorsalis pedis pulses are 2+ on the  right side, and 2+ on the left side.  Musculoskeletal:  Lower extremities: No obvious deformities, erythema, or ecchymosis. Full ROM at the knee and ankle. No point bony tenderness. Right lower extremity: Patient is diffusely tender over the plantar aspect of the foot and over the achilles tendon.    Neurological: She is alert.  Clear speech. Lower extremities: Sensation intact to dull/sharp touch.  5 out of 5 strength with plantar and dorsiflexion of the ankle.  2+ Achilles reflex.  Skin: Capillary refill takes less than 2 seconds.  Psychiatric: She has a normal mood and affect. Her behavior is normal. Thought content normal.  Nursing note and vitals reviewed.    ED Treatments / Results  Radiology Dg Foot Complete Right  Result Date: 09/27/2017 CLINICAL DATA:  Foot pain EXAM: RIGHT FOOT COMPLETE - 3+ VIEW COMPARISON:  None. FINDINGS: There is no evidence of fracture or dislocation. There is no evidence of arthropathy or other focal bone abnormality. Soft tissues are unremarkable. IMPRESSION: Negative. Electronically Signed   By: Kennith Center M.D.   On: 09/27/2017 13:37    Initial Impression / Assessment and Plan / ED Course  I have reviewed the triage vital signs and the nursing notes.  Pertinent labs & imaging results that were available during my care of the patient were reviewed by me and considered in my medical decision making (see chart for details).   Patient presents with acute on chronic right foot pain, previously treated with steroids and tramadol with minimal relief.  X-ray negative for fractures or dislocation, she is neurovascularly intact.  There are no open wounds, rashes, fever, or chills, therefore doubt infection. Given history and physical exam suspect plantar fasciitis. Instructed patient to purchase heel cups for her tennis shoes, provided stretching exercises, otherwise supportive treatment with Tylenol, ice, elevation, and her prescribed meloxicam.  Provided patient  with podiatry follow-up.  Provided opportunity for questions, patient confirmed understanding.  Final Clinical Impressions(s) / ED Diagnoses   Final diagnoses:  Plantar fasciitis of right foot    ED Discharge Orders    None       Cherly Anderson, PA-C 09/27/17 1419    Mancel Bale, MD 09/28/17 [phone]

## 2017-09-27 NOTE — ED Triage Notes (Signed)
2 day hx of increased r/foot pain. Hx of treatment x 2, by,  PCP for plantar fasciatus

## 2018-07-04 ENCOUNTER — Emergency Department (HOSPITAL_COMMUNITY): Payer: 59

## 2018-07-04 ENCOUNTER — Emergency Department (HOSPITAL_COMMUNITY)
Admission: EM | Admit: 2018-07-04 | Discharge: 2018-07-04 | Disposition: A | Discharge status: Home / Self Care | Payer: 59 | Attending: Emergency Medicine | Admitting: Emergency Medicine

## 2018-07-04 ENCOUNTER — Other Ambulatory Visit: Payer: Self-pay

## 2018-07-04 ENCOUNTER — Encounter (HOSPITAL_COMMUNITY): Payer: Self-pay | Admitting: Emergency Medicine

## 2018-07-04 DIAGNOSIS — S43401A Unspecified sprain of right shoulder joint, initial encounter: Secondary | ICD-10-CM | POA: Diagnosis not present

## 2018-07-04 DIAGNOSIS — I10 Essential (primary) hypertension: Secondary | ICD-10-CM | POA: Insufficient documentation

## 2018-07-04 DIAGNOSIS — Z23 Encounter for immunization: Secondary | ICD-10-CM | POA: Insufficient documentation

## 2018-07-04 DIAGNOSIS — Y92008 Other place in unspecified non-institutional (private) residence as the place of occurrence of the external cause: Secondary | ICD-10-CM | POA: Insufficient documentation

## 2018-07-04 DIAGNOSIS — S83411A Sprain of medial collateral ligament of right knee, initial encounter: Secondary | ICD-10-CM | POA: Diagnosis not present

## 2018-07-04 DIAGNOSIS — S20211A Contusion of right front wall of thorax, initial encounter: Secondary | ICD-10-CM | POA: Diagnosis not present

## 2018-07-04 DIAGNOSIS — Y998 Other external cause status: Secondary | ICD-10-CM | POA: Diagnosis not present

## 2018-07-04 DIAGNOSIS — S0990XA Unspecified injury of head, initial encounter: Secondary | ICD-10-CM | POA: Diagnosis present

## 2018-07-04 DIAGNOSIS — J45909 Unspecified asthma, uncomplicated: Secondary | ICD-10-CM | POA: Insufficient documentation

## 2018-07-04 DIAGNOSIS — S93401A Sprain of unspecified ligament of right ankle, initial encounter: Secondary | ICD-10-CM | POA: Diagnosis not present

## 2018-07-04 DIAGNOSIS — Z7984 Long term (current) use of oral hypoglycemic drugs: Secondary | ICD-10-CM | POA: Insufficient documentation

## 2018-07-04 DIAGNOSIS — W19XXXA Unspecified fall, initial encounter: Secondary | ICD-10-CM

## 2018-07-04 DIAGNOSIS — Y939 Activity, unspecified: Secondary | ICD-10-CM | POA: Insufficient documentation

## 2018-07-04 DIAGNOSIS — W108XXA Fall (on) (from) other stairs and steps, initial encounter: Secondary | ICD-10-CM | POA: Diagnosis not present

## 2018-07-04 DIAGNOSIS — E119 Type 2 diabetes mellitus without complications: Secondary | ICD-10-CM | POA: Insufficient documentation

## 2018-07-04 DIAGNOSIS — S0083XA Contusion of other part of head, initial encounter: Secondary | ICD-10-CM | POA: Diagnosis not present

## 2018-07-04 DIAGNOSIS — T148XXA Other injury of unspecified body region, initial encounter: Secondary | ICD-10-CM

## 2018-07-04 DIAGNOSIS — S46911A Strain of unspecified muscle, fascia and tendon at shoulder and upper arm level, right arm, initial encounter: Secondary | ICD-10-CM

## 2018-07-04 DIAGNOSIS — Z79899 Other long term (current) drug therapy: Secondary | ICD-10-CM | POA: Insufficient documentation

## 2018-07-04 DIAGNOSIS — S83401A Sprain of unspecified collateral ligament of right knee, initial encounter: Secondary | ICD-10-CM

## 2018-07-04 MED ORDER — DICLOFENAC SODIUM 50 MG PO TBEC: 50.0000 mg | DELAYED_RELEASE_TABLET | Freq: Two times a day (BID) | ORAL | 0 refills | Status: DC

## 2018-07-04 MED ORDER — METHOCARBAMOL 500 MG PO TABS: 500.0000 mg | ORAL_TABLET | Freq: Two times a day (BID) | ORAL | 0 refills | Status: DC

## 2018-07-04 MED ORDER — BACITRACIN ZINC 500 UNIT/GM EX OINT: TOPICAL_OINTMENT | Freq: Two times a day (BID) | CUTANEOUS | Status: DC

## 2018-07-04 MED ORDER — TETANUS-DIPHTH-ACELL PERTUSSIS 5-2.5-18.5 LF-MCG/0.5 IM SUSP
0.5000 mL | Freq: Once | INTRAMUSCULAR | Status: AC
  Administered 2018-07-04: 0.5 mL via INTRAMUSCULAR
  Filled 2018-07-04: qty 0.5

## 2018-07-04 MED ORDER — METHOCARBAMOL 500 MG PO TABS
500.0000 mg | ORAL_TABLET | Freq: Once | ORAL | Status: AC
  Administered 2018-07-04: 500 mg via ORAL
  Filled 2018-07-04: qty 1

## 2018-07-04 MED ORDER — KETOROLAC TROMETHAMINE 60 MG/2ML IM SOLN
30.0000 mg | Freq: Once | INTRAMUSCULAR | Status: AC
  Administered 2018-07-04: 30 mg via INTRAMUSCULAR
  Filled 2018-07-04: qty 2

## 2018-07-04 NOTE — ED Triage Notes (Signed)
Patient c/o right ankle pain and right shoulder pain after fall down two brick steps yesterday. Reports hitting head during fall. Denies taking blood thinners.

## 2018-07-04 NOTE — ED Notes (Signed)
Patient transported to X-ray 

## 2018-07-04 NOTE — Discharge Instructions (Signed)
Follow up with your primary care doctor in the next few day for recheck. Return here sooner for any problems. Do not take the muscle relaxer if driving as it will make you sleepy

## 2018-07-04 NOTE — ED Provider Notes (Signed)
[facility]-EMERGENCY DEPT Provider Note   CSN: [id number] Arrival date & time: 07/04/18  1627     History   Chief Complaint Chief Complaint  Patient presents with  . Fall    HPI Michele Craig is a 47 y.o. female who presents to the ED s/p fall with c/o right ankle and right shoulder pain. Patient reports she was looking at a house and when coming out the steps were uneven and she fell down two brick steps. Patient reports landing between the steps and a car and her back being scraped by a brick that she landed on.  Patient states she did hit her head but no LOC. Patient not taking blood thinners. Patient states she went to an Urgent Care yesterday and they looked at her bu did not do x-rays or anything else. Told her to stay on her current medications.   HPI  Past Medical History:  Diagnosis Date  . Anemia   . Anxiety   . Arthritis   . Asthma   . Complication of anesthesia    with carpal tunnel syndrome the block didn't work well, she could feel things during the surgery  . Depression   . Diabetes mellitus    pt states after her gastric sleeve procedure she is no longer on medications and is not checked for it  . Family history of adverse reaction to anesthesia    unknown, pt is adopted  . Fibromyalgia   . Foot pain   . GERD (gastroesophageal reflux disease)   . Headache   . Hypertension   . IBS (irritable bowel syndrome)    after cholecystectomy and then it went away after Gastric sleeve  . Pneumonia   . Shortness of breath dyspnea    only when her asthma is flaring up  . Sleep apnea    does not use cpap    Patient Active Problem List   Diagnosis Date Noted  . IUD (intrauterine device) in place 01/22/2017  . Abnormal uterine bleeding (AUB) 01/22/2017  . Obstructive sleep apnea 03/08/2013  . Morbid obesity (HCC) 04/01/2012  . HTN (hypertension) 04/01/2012  . DM (diabetes mellitus) (HCC) 04/01/2012  . Allergic rhinitis 04/01/2012    Past  Surgical History:  Procedure Laterality Date  . CARPAL TUNNEL RELEASE    . CARPAL TUNNEL RELEASE Right 04/04/2016   Procedure: RIGHT CARPAL TUNNEL RELEASE;  Surgeon: Frederico Hamman, MD;  Location: [facility] OR;  Service: Orthopedics;  Laterality: Right;  Synovectomy   . CESAREAN SECTION     x 2  . CHOLECYSTECTOMY    . DILATION AND CURETTAGE OF UTERUS    . LAPAROSCOPIC GASTRIC SLEEVE RESECTION    . TONSILLECTOMY       OB History   None      Home Medications    Prior to Admission medications   Medication Sig Start Date End Date Taking? Authorizing Provider  ADVAIR DISKUS 250-50 MCG/DOSE AEPB Inhale 1 puff into the lungs 2 (two) times daily.  05/17/14   [provider]  albuterol (PROVENTIL HFA;VENTOLIN HFA) 108 (90 BASE) MCG/ACT inhaler Inhale 2 puffs into the lungs every 6 (six) hours as needed for wheezing or shortness of breath.     [provider]  amLODipine (NORVASC) 10 MG tablet Take 10 mg by mouth every morning.     [provider]  atorvastatin (LIPITOR) 20 MG tablet Take 20 mg by mouth every evening.  05/14/14   [provider]  cetirizine (  ZYRTEC) 10 MG tablet Take 10 mg by mouth daily as needed for allergies.     [provider]  diclofenac (VOLTAREN) 50 MG EC tablet Take 1 tablet (50 mg total) by mouth 2 (two) times daily. 07/04/18   Janne Napoleon, NP  DULoxetine (CYMBALTA) 60 MG capsule Take 60 mg by mouth every evening.     [provider]  furosemide (LASIX) 40 MG tablet Take 60 mg by mouth every morning.  03/08/14   [provider]  gabapentin (NEURONTIN) 100 MG capsule Take 300 mg by mouth 3 (three) times daily.    [provider]  hydrochlorothiazide (HYDRODIURIL) 12.5 MG tablet Take 12.5 mg by mouth daily.    [provider]  metFORMIN (GLUCOPHAGE) 500 MG tablet Take 500 mg by mouth 2 (two) times daily with a meal.     [provider]  methocarbamol (ROBAXIN) 500 MG tablet Take 1 tablet  (500 mg total) by mouth 2 (two) times daily. 07/04/18   Janne Napoleon, NP  omeprazole (PRILOSEC OTC) 20 MG tablet Take 20 mg by mouth daily.    [provider]  oxyCODONE-acetaminophen (PERCOCET/ROXICET) 5-325 MG per tablet Take 1-2 tablets by mouth every 6 (six) hours as needed for moderate pain or severe pain. 05/21/14   Lurene Shadow, PA-C  ranitidine (ZANTAC) 150 MG tablet Take 150 mg by mouth 2 (two) times daily.    [provider]  Vitamin D, Ergocalciferol, (DRISDOL) 50000 UNITS CAPS capsule Take 50,000 Units by mouth every Saturday.  05/19/14   [provider]  zolpidem (AMBIEN) 10 MG tablet Take 10 mg by mouth at bedtime.    [provider]    Family History Family History  Adopted: Yes  Family history unknown: Yes    Social History Social History   Tobacco Use  . Smoking status: Never Smoker  . Smokeless tobacco: Never Used  Substance Use Topics  . Alcohol use: No  . Drug use: No     Allergies   Shrimp [shellfish allergy]; Penicillins; and Zosyn [piperacillin sod-tazobactam so]   Review of Systems Review of Systems  Constitutional: Negative for diaphoresis.  HENT: Negative.   Eyes: Visual disturbance: yesterday but resolved.  Respiratory: Negative for cough and chest tightness.   Cardiovascular: Negative for chest pain.  Gastrointestinal: Negative for abdominal pain, nausea and vomiting.  Genitourinary: Negative for dysuria and frequency.       No loss of control of bladder or bowels.  Musculoskeletal: Positive for arthralgias and neck pain.  Skin: Positive for wound.  Neurological: Negative for syncope, weakness and headaches.  Psychiatric/Behavioral: Negative for confusion.     Physical Exam Updated Vital Signs BP 103/61 (BP Location: Left Arm)   Pulse 90   Temp 99.3 F (37.4 C) (Oral)   Resp 18   SpO2 97%   Physical Exam  Constitutional: She is oriented to person, place, and time. No distress.  Morbidly obese    HENT:  Head: Head is with contusion.    Right Ear: Tympanic membrane normal.  Left Ear: Tympanic membrane normal.  Nose: Nose normal.  Mouth/Throat: Uvula is midline, oropharynx is clear and moist and mucous membranes are normal.  Eyes: Pupils are equal, round, and reactive to light. Conjunctivae and EOM are normal.  Neck: Trachea normal and normal range of motion. Neck supple. Spinous process tenderness and muscular tenderness present.  Cardiovascular: Normal rate, regular rhythm and intact distal pulses.  Pulmonary/Chest: Effort normal and breath sounds  normal. No respiratory distress. She has no wheezes. She has no rales. She exhibits no tenderness.  Abdominal: Soft. Bowel sounds are normal. There is no tenderness.  Musculoskeletal:       Right shoulder: She exhibits tenderness. She exhibits no crepitus, no deformity, no laceration, no spasm, normal pulse and normal strength. Decreased range of motion: due to pain.       Right knee: She exhibits swelling. She exhibits normal range of motion, no laceration, no erythema and normal alignment. Tenderness found. LCL tenderness noted.       Right ankle: She exhibits swelling. She exhibits no ecchymosis, no deformity, no laceration and normal pulse. Decreased range of motion: due to pain. Tenderness. Lateral malleolus tenderness found.  Neurological: She is alert and oriented to person, place, and time. She has normal strength. No cranial nerve deficit. Gait normal.  Reflex Scores:      Bicep reflexes are 2+ on the right side and 2+ on the left side.      Brachioradialis reflexes are 2+ on the right side and 2+ on the left side. Skin: Skin is warm and dry.  Psychiatric: She has a normal mood and affect. Her behavior is normal.  Nursing note and vitals reviewed.    ED Treatments / Results  Labs (all labs ordered are listed, but only abnormal results are displayed) Labs Reviewed - No data to display Radiology Dg Ribs Unilateral W/chest  Right  Result Date: 07/04/2018 CLINICAL DATA:  Fall yesterday, RIGHT rib pain. EXAM: RIGHT RIBS AND CHEST - 3+ VIEW COMPARISON:  Chest x-ray dated 03/06/2011. FINDINGS: Heart size and mediastinal contours are within normal limits. Lungs are clear. No pleural effusion or pneumothorax seen. Osseous structures about the chest are unremarkable. No RIGHT-sided rib fracture or displacement seen. IMPRESSION: Negative. Electronically Signed   By: Bary Richard M.D.   On: 07/04/2018 18:32   Dg Cervical Spine Complete  Result Date: 07/04/2018 CLINICAL DATA:  Fall, pain EXAM: CERVICAL SPINE - COMPLETE 4+ VIEW COMPARISON:  None. FINDINGS: The C1 through C6 vertebral body are well visualized. C7 vertebral body and C7-T1 disc space are obscured by overlying osseous and soft tissues. No fracture line or displaced fracture fragment is seen amongst the visualized cervical vertebrae. Slight reversal of the normal cervical spine lordosis is likely related to mild degenerative spondylitic changes within the mid and lower cervical spine. Visualized facet joints appear intact and normally aligned. Odontoid view is symmetric. Prevertebral soft tissues are normal in thickness. IMPRESSION: 1. No fracture or dislocation appreciated from C1 through C6. 2. C7 vertebral body and C7-T1 disc space are obscured by overlying soft tissues. If symptomatic within the lower cervical spine, would consider CT for further characterization. 3. Mild degenerative spondylosis within the mid and lower cervical spine. Electronically Signed   By: Bary Richard M.D.   On: 07/04/2018 18:31   Dg Shoulder Right  Result Date: 07/04/2018 CLINICAL DATA:  Fall yesterday, RIGHT shoulder pain. EXAM: RIGHT SHOULDER - 2+ VIEW COMPARISON:  None. FINDINGS: Osseous alignment is normal. No fracture line or displaced fracture fragment seen. Soft tissues about the RIGHT shoulder are unremarkable. IMPRESSION: Negative. Electronically Signed   By: Bary Richard M.D.   On:  07/04/2018 18:31   Dg Ankle Complete Right  Result Date: 07/04/2018 CLINICAL DATA:  Fall, RIGHT ankle pain. EXAM: RIGHT ANKLE - COMPLETE 3+ VIEW COMPARISON:  None. FINDINGS: Osseous alignment is normal. Ankle mortise is symmetric. No fracture line or displaced fracture fragment seen. Visualized  portions of the hindfoot and midfoot appear intact and normally aligned. Soft tissues about the RIGHT ankle are unremarkable. IMPRESSION: Negative. Electronically Signed   By: Bary Richard M.D.   On: 07/04/2018 18:33   Dg Knee Complete 4 Views Right  Result Date: 07/04/2018 CLINICAL DATA:  Fall you yesterday, knee pain. EXAM: RIGHT KNEE - COMPLETE 4+ VIEW COMPARISON:  None. FINDINGS: Osseous alignment is normal. No fracture line or displaced fracture fragment seen. No appreciable joint effusion and adjacent soft tissues are unremarkable. Mild degenerative spurring is seen at the lateral and patellofemoral compartments. IMPRESSION: 1. No acute findings. No osseous fracture or dislocation. No evidence of joint effusion. 2. Mild degenerative spurring at the lateral and patellofemoral compartments. Electronically Signed   By: Bary Richard M.D.   On: 07/04/2018 18:34    Procedures Procedures (including critical care time)  Medications Ordered in ED Medications  bacitracin ointment (has no administration in time range)  ketorolac (TORADOL) injection 30 mg (30 mg Intramuscular Given 07/04/18 1823)  methocarbamol (ROBAXIN) tablet 500 mg (500 mg Oral Given 07/04/18 1822)  Tdap (BOOSTRIX) injection 0.5 mL (0.5 mLs Intramuscular Given 07/04/18 1822)     Initial Impression / Assessment and Plan / ED Course  I have reviewed the triage vital signs and the nursing notes. 46 y.o. female with multiple contusions, scrapes and sprains s/p fall stable for d/c without fracture or dislocation noted on x-rays. Ace wrap to right knee and right ankle, pain managed in the ED. Return precautions discussed. Patient agrees with  plan.  Final Clinical Impressions(s) / ED Diagnoses   Final diagnoses:  Fall, initial encounter  Contusion of face, initial encounter  Abrasion  Sprain of right ankle, unspecified ligament, initial encounter  Sprain of collateral ligament of right knee, initial encounter  Rib contusion, right, initial encounter  Shoulder strain, right, initial encounter    ED Discharge Orders         Ordered    diclofenac (VOLTAREN) 50 MG EC tablet  2 times daily,   Status:  Discontinued     07/04/18 1850    methocarbamol (ROBAXIN) 500 MG tablet  2 times daily,   Status:  Discontinued     07/04/18 1850    diclofenac (VOLTAREN) 50 MG EC tablet  2 times daily     07/04/18 1946    methocarbamol (ROBAXIN) 500 MG tablet  2 times daily     07/04/18 1946           Kerrie Buffalo Warden, NP 07/04/18 1949    Raeford Razor, MD 07/08/18 1446

## 2018-11-22 ENCOUNTER — Encounter: Payer: Self-pay | Admitting: Neurology

## 2018-11-23 ENCOUNTER — Telehealth: Payer: Self-pay | Admitting: Neurology

## 2018-11-23 ENCOUNTER — Institutional Professional Consult (permissible substitution): Payer: Self-pay | Admitting: Neurology

## 2018-11-23 NOTE — Telephone Encounter (Signed)
Patient called the morning of and rescheduled her apt. This is considered a no show.

## 2018-11-24 ENCOUNTER — Encounter: Payer: Self-pay | Admitting: Neurology

## 2019-01-03 ENCOUNTER — Institutional Professional Consult (permissible substitution): Payer: Medicaid Other | Admitting: Neurology

## 2019-01-31 ENCOUNTER — Encounter: Payer: Self-pay | Admitting: Neurology

## 2019-01-31 ENCOUNTER — Telehealth: Payer: Self-pay | Admitting: Neurology

## 2019-01-31 NOTE — Telephone Encounter (Signed)
Called the patient to inform them that our office has placed new protocols in place for our office visits. Due to the virus pandemic our office is reducing our number of office visits in order to minimize the risk to our patients and healthcare providers. Advised that our office is now providing the capability to offer the patients phone visits at this time. Informed of what that process looks like and informed that the telephone office visit will still be billed through insurance and due to [place] we need them to know since the appointment is taking place over the phone, we can't guarantee the security of the phone line. With that said if we do move forward I would have to get verbal consent to completed the call over the phone. Pt verbalized understanding and gave verbal consent to keep the apt the same time and complete the apt on the phone. Patient was advised to be at the number listed around the time of her apt between 11 am-11:45 am. Pt verbalized understanding. Reviewed the patient's chart, medications, allergies and pharmacy on file. Pt verbalized understanding and will be waiting for Dr Dohmeier's call tomorrow.

## 2019-02-01 ENCOUNTER — Other Ambulatory Visit: Payer: Self-pay

## 2019-02-01 ENCOUNTER — Telehealth (INDEPENDENT_AMBULATORY_CARE_PROVIDER_SITE_OTHER): Payer: 59 | Admitting: Neurology

## 2019-02-01 DIAGNOSIS — E1159 Type 2 diabetes mellitus with other circulatory complications: Secondary | ICD-10-CM

## 2019-02-01 DIAGNOSIS — Z9884 Bariatric surgery status: Secondary | ICD-10-CM | POA: Insufficient documentation

## 2019-02-01 DIAGNOSIS — G4733 Obstructive sleep apnea (adult) (pediatric): Secondary | ICD-10-CM | POA: Diagnosis not present

## 2019-02-01 DIAGNOSIS — E669 Obesity, unspecified: Secondary | ICD-10-CM

## 2019-02-01 DIAGNOSIS — E662 Morbid (severe) obesity with alveolar hypoventilation: Secondary | ICD-10-CM | POA: Insufficient documentation

## 2019-02-01 NOTE — Progress Notes (Addendum)
SLEEP MEDICINE CLINIC   Provider:  Melvyn Novas, MD  Primary Care Physician:  Mariea Stable, [place]  Weight loss clinic [facility]-  Virtual Visit via Telephone Note  I connected with Michele Craig on 02/01/19 at 11:30 AM EDT by telephone and verified that I am speaking with the correct person using two identifiers.   I discussed the limitations, risks, security and privacy concerns of performing an evaluation and management service by telephone and the availability of in person appointments. I also discussed with the patient that there may be a patient responsible charge related to this service. The patient expressed understanding and agreed to proceed.   History of Present Illness:  Michele Greeley had been a patient in 2012 , was evaluated for OSA in a PSG on 08-18-2011, after referral by Dr. Everlene Other -  and followed by a CPAP titration. After that CPAP titration she lost her insurance. She never could get CPAP until insurance changed.       Sleep habits are as follows: dinner time is 6 PM, lately later due to Covid restrictions on food shopping.  11 PM is bed time.  5 family members of different age - she sleeps in a cool, quiet and dark bedroom. She sleeps on her sides, on 2 pillows for neck support. 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  Bupropion helps with alertness in daytime, Hydralazine helps with sleep.    Family sleep history: nobody else with OSA.   Social history: Husband and three children. Son 10 9 he has a child), daughter 6, son 58 years old. College graduate, non smoker, non drinker, caffeine : quit SODAs 2012. Coffee 1-2 cups in AM, no teas.      Review of Systems:  Husband says she snores and has observed apnea, she hears him snoring, too. Out of a complete  14 system review, the patient complains of only the following symptoms, and all other reviewed systems are negative. How likely are you to doze in the following situations: 0 = not likely, 1 = slight chance, 2 = moderate chance, 3 = high chance  Sitting and Reading? 0 Watching Television? 3 Sitting inactive in a public place (theater or meeting)? 2 Lying down in the afternoon when circumstances permit? 2 Sitting and talking to someone? 1 Sitting quietly after lunch without alcohol? 2 In a car, while stopped for a few minutes in traffic? 0 As a passenger in a car for an hour without a break? 2  Total = 12/ 24     Epworth Sleepiness score 12 , Fatigue severity score N/A  , depression score No flowsheet data found.   Social History   Socioeconomic History  . Marital status: Married    Spouse name: Not on file  . Number of children: Not on file  . Years of education: Not on file  . Highest education level: Not on file  Occupational History  . Not on file  Social Needs  . Financial resource strain: Not on file  . Food insecurity:    Worry: Not on file    Inability: Not on file  . Transportation needs:    Medical: Not on file    Non-medical: Not on file  Tobacco Use  . Smoking status: Never Smoker  .  Smokeless tobacco: Never Used  Substance and Sexual Activity  . Alcohol use: No  . Drug use: No  . Sexual activity: Yes    Birth control/protection: I.U.D.  Lifestyle  . Physical activity:    Days per week: Not on file    Minutes per session: Not on file  . Stress: Not on file  Relationships  . Social connections:    Talks on phone: Not on file    Gets together: Not on file    Attends religious service: Not on file    Active member of club or organization: Not on file    Attends meetings of clubs or organizations: Not on file    Relationship status: Not on file  . Intimate partner violence:    Fear of current or ex partner: Not on file    Emotionally abused: Not on  file    Physically abused: Not on file    Forced sexual activity: Not on file  Other Topics Concern  . Not on file  Social History Narrative  . Not on file    Family History  Adopted: Yes  Family history unknown: Yes    Past Medical History:  Diagnosis Date  . Anemia   . Anxiety   . Arthritis   . Asthma   . Complication of anesthesia    with carpal tunnel syndrome the block didn't work well, she could feel things during the surgery  . Depression   . Diabetes mellitus    pt states after her gastric sleeve procedure she is no longer on medications and is not checked for it  . Family history of adverse reaction to anesthesia    unknown, pt is adopted  . Fibromyalgia   . Foot pain   . GERD (gastroesophageal reflux disease)   . Headache   . Hypertension   . IBS (irritable bowel syndrome)    after cholecystectomy and then it went away after Gastric sleeve  . Pneumonia   . Shortness of breath dyspnea    only when her asthma is flaring up  . Sleep apnea    does not use cpap    Past Surgical History:  Procedure Laterality Date  . CARPAL TUNNEL RELEASE    . CARPAL TUNNEL RELEASE Craig 04/04/2016   Procedure: Craig CARPAL TUNNEL RELEASE;  Surgeon: Frederico Hamman, MD;  Location: [facility] OR;  Service: Orthopedics;  Laterality: Craig;  Synovectomy   . CESAREAN SECTION     x 2  . CHOLECYSTECTOMY    . DILATION AND CURETTAGE OF UTERUS    . LAPAROSCOPIC GASTRIC SLEEVE RESECTION    . TONSILLECTOMY      Current Outpatient Medications  Medication Sig Dispense Refill  . ADVAIR DISKUS 250-50 MCG/DOSE AEPB Inhale 1 puff into the lungs 2 (two) times daily.     Marland Kitchen albuterol (PROVENTIL HFA;VENTOLIN HFA) 108 (90 BASE) MCG/ACT inhaler Inhale 2 puffs into the lungs every 6 (six) hours as needed for wheezing or shortness of breath.     Marland Kitchen amLODipine (NORVASC) 10 MG tablet Take 10 mg by mouth every morning.     Marland Kitchen buPROPion (WELLBUTRIN XL) 150 MG 24 hr tablet TK 1 T PO QAM AFTER BRE FOR DEPRESSION     . cetirizine (ZYRTEC) 10 MG tablet Take 10 mg by mouth daily as needed for allergies.     Marland Kitchen diclofenac (VOLTAREN) 50 MG EC tablet Take 1 tablet (50 mg total) by mouth 2 (two) times daily. 15 tablet 0  .  DULoxetine (CYMBALTA) 60 MG capsule Take 60 mg by mouth every evening.     . furosemide (LASIX) 40 MG tablet Take 60 mg by mouth every morning.     . gabapentin (NEURONTIN) 100 MG capsule Take 300 mg by mouth 3 (three) times daily.    . hydrochlorothiazide (HYDRODIURIL) 12.5 MG tablet Take 12.5 mg by mouth daily.    . hydrOXYzine (ATARAX/VISTARIL) 10 MG tablet TK 1 T PO BID PRA    . methocarbamol (ROBAXIN) 500 MG tablet Take 1 tablet (500 mg total) by mouth 2 (two) times daily. 20 tablet 0  . methotrexate (RHEUMATREX) 2.5 MG tablet Take 2.5 mg by mouth once a week.     . Naltrexone-buPROPion HCl ER 8-90 MG TB12 Take by mouth.    Marland Kitchen omeprazole (PRILOSEC OTC) 20 MG tablet Take 20 mg by mouth daily.    . traZODone (DESYREL) 50 MG tablet TK 1 TO 2 TS PO QHS PRF SLP     No current facility-administered medications for this visit.     Allergies as of 02/01/2019 - Review Complete 01/31/2019  Allergen Reaction Noted  . Shrimp [shellfish allergy] Anaphylaxis 08/05/2011  . Penicillins Hives 11/30/2013  . Zosyn [piperacillin sod-tazobactam so] Hives and Itching 04/01/2012     Observations/Objective: OSA in the setting of super obesity.  Michele. Michele be Craig is a meanwhile 48 year old African-American female patient who has been known to suffer from obstructive sleep apnea for at least 8 years.  She underwent a gastric sleeve procedure and initially lost about 80 pounds which she has slowly regained over the last 4 years.  Her sleep study at [facility] sleep dated August 18, 2011 documented that mild apnea with an AHI of 9.4 REM AHI was 39.5/h supine AHI was not higher than nonsupine.  She had only 8 minutes of desaturation she did not have periodic limb movements at the time and CO2 monitoring was  negative for retention.     I discussed the assessment and treatment plan with the patient. The patient was provided an opportunity to ask questions and all were answered. The patient agreed with the plan and demonstrated an understanding of the instructions.   The patient was advised to call back or seek an in-person evaluation if the symptoms worsen or if the condition fails to improve as anticipated.  HST ordered to confirm the presence of OSA.   I provided 30 minutes of non-face-to-face time during this encounter.   Melvyn Novas, MD  02-01-2019    Melvyn Novas, MD 02/01/2019, 11:39 AM  Certified in Neurology by ABPN Certified in Sleep Medicine by [facility] [address], [address] [place], [place] [address]

## 2019-02-01 NOTE — Patient Instructions (Signed)

## 2019-04-11 ENCOUNTER — Other Ambulatory Visit: Payer: Self-pay

## 2019-04-11 ENCOUNTER — Ambulatory Visit: Payer: 59 | Admitting: Neurology

## 2019-04-11 DIAGNOSIS — G4733 Obstructive sleep apnea (adult) (pediatric): Secondary | ICD-10-CM | POA: Diagnosis not present

## 2019-04-11 DIAGNOSIS — E669 Obesity, unspecified: Secondary | ICD-10-CM

## 2019-04-11 DIAGNOSIS — Z9884 Bariatric surgery status: Secondary | ICD-10-CM

## 2019-04-11 DIAGNOSIS — E1159 Type 2 diabetes mellitus with other circulatory complications: Secondary | ICD-10-CM

## 2019-04-19 DIAGNOSIS — E669 Obesity, unspecified: Secondary | ICD-10-CM | POA: Insufficient documentation

## 2019-04-19 NOTE — Procedures (Signed)
Patient Information     First Name: Michele Last Name: Ramiah Craig: [id number]  Birth Date: Nov 13, 1970 Age: 48 Gender: Female  Referring Provider: Learta Codding, PA weight loss clinic at [facility] Weight: 410 lbs     Epworth:  12/24   Sleep Study Information     Study Date: April 11, 2019 S/H/A Version: [id number] / 4.1.1528 / 92  History:   Mrs. Michele Osinski was seen in a video visit on 02-01-2019. She is now 48 years old, had been a patient in 2012, was evaluated for OSA in a PSG on 08-18-2011, followed by a CPAP titration. After that CPAP titration she lost her insurance. She never could get CPAP until insurance changed. DM, EDS, depression, anxiety- gastric sleeve procedure (05-30-2014) for super -obesity BMI over 60 kg/m2 at that time.  Lost weight for 2 years and regained in the following 3 years. She is now interested in a new OSA evaluation.       Diagnosis:       Mild Obstructive Sleep apnea at an AHI of 16.1/h with REM sleep accentuation to 31.9/h was noted.  The patient seemed not to snore, her RDI was identical to the AHI. There was no strong positional dependence noted.  Recommendations:      The patient can start on CPAP auto titration device with heated humidity and mask of her choice.  Setting will be 5-15 cm water pressure with 3 cm EPR.      Electronically Signed: Larey Seat, MD   04-19-2019           Sleep Summary  Oxygen Saturation Statistics   Start Study Time: End Study Time: Total Recording Time:  11:01:36 PM         6:21:23 AM   7 h, 19 min  Total Sleep Time % REM of Sleep Time:  6 h, 16 min  23.8    Mean: 94 Minimum: 90 Maximum: 99  Mean of Desaturations Nadirs (%):   93  Oxygen Desaturation. %: 4-9 10-20 >20 Total  Events Number Total  25 100.0  0 0.0  0 0.0  25 100.0  Oxygen Saturation: <90 <=88 <85 <80 <70  Duration (minutes): Sleep % 0.0 0.0 0.0 0.0 0.0 0.0 0.0 0.0 0.0 0.0     Respiratory Indices      Total Events REM NREM All  Night  pRDI:  99  pAHI:  99 ODI:  25  pAHIc:  2  % CSR: 0.0 31.9 31.9 9.0 0.0 11.2 11.2 2.5 0.4 16.0 16.0 4.0 0.3       Pulse Rate Statistics during Sleep (BPM)      Mean: 79 Minimum: 63 Maximum: 88    Indices are calculated using technically valid sleep time of  6 hrs, 11 min. Central-Indices are calculated using technically valid sleep time of  6  hrs, 1 min. pRDI/pAHI are calculated using oxi desaturations ? 3%  Body Position Statistics  Position Supine Prone Right Left Non-Supine  Sleep (min) 165.0 200.0 0.0 9.0 209.0  Sleep % 43.8 53.1 0.0 2.4 55.5  pRDI 15.0 14.9 N/A N/A 16.4  pAHI 15.0 14.9 N/A N/A 16.4  ODI 2.9 4.3 N/A N/A 4.7     Snoring Statistics Snoring Level (dB) >40 >50 >60 >70 >80 >Threshold (45)  Sleep (min) 49.2 6.7 1.5 0.0 0.0 10.4  Sleep % 13.1 1.8 0.4 0.0 0.0 2.8    Mean: 41 dB Sleep Stages Chart  pAHI=16.0                                     Mild              Moderate                    Severe                                                 5              15                    30

## 2019-04-19 NOTE — Addendum Note (Signed)
Addended by: Larey Seat on: 04/19/2019 06:21 PM   Modules accepted: Orders

## 2019-04-20 ENCOUNTER — Telehealth: Payer: Self-pay | Admitting: Neurology

## 2019-04-20 NOTE — Telephone Encounter (Signed)
Unable to lvm due to mailbox full. Will attempt to call again

## 2019-04-20 NOTE — Telephone Encounter (Signed)
Called patient to discuss sleep study results. No answer at this time. LVM for the patient to call back.   

## 2019-04-20 NOTE — Telephone Encounter (Addendum)
I called pt. I advised pt that Dr. Brett Fairy reviewed their sleep study results and found that pt mild to moderate sleep apnea. Dr. Brett Fairy recommends that pt auto CPAP. I reviewed PAP compliance expectations with the pt. Pt is agreeable to starting a CPAP. I advised pt that an order will be sent to a DME, [facility], and [facility] will call the pt within about one week after they file with the pt's insurance. [facility] will show the pt how to use the machine, fit for masks, and troubleshoot the CPAP if needed. A follow up appt was made for insurance purposes with Ward Givens, NP on Aug 20,2020 at 11:30am. Pt verbalized understanding to arrive 15 minutes early and bring their CPAP. A letter with all of this information in it will be mailed to the pt as a reminder. I verified with the pt that the address we have on file is correct. Pt verbalized understanding of results. Pt had no questions at this time but was encouraged to call back if questions arise. I have sent the order to [facility] and have received confirmation that they have received the order.  Pt wants me to fax a copy to her NP and she is going to call back with their fax number as I didn't see in epic

## 2019-04-20 NOTE — Telephone Encounter (Signed)
-----   Message from Larey Seat, MD sent at 04/19/2019  6:21 PM EDT ----- Cc Madlyn Frankel , PA  Diagnosis:      Mild Obstructive Sleep apnea at an AHI of 16.1/h was found and REM sleep accentuation to 31.9/h was noted.  The patient seemed not to snore, her RDI was identical to the AHI.  There was no strong positional dependence noted.  Recommendations:     The patient can start on CPAP auto titration device with heated humidity and mask of her choice.  Setting will be 5-15 cm water pressure with 3 cm EPR.      Electronically Signed: Larey Seat, MD   04-19-2019

## 2019-05-09 ENCOUNTER — Telehealth: Payer: Self-pay | Admitting: Neurology

## 2019-05-09 NOTE — Telephone Encounter (Signed)
Pt called and asked that her home sleep study results be sent to Dr. Doreatha Massed at [facility]. Fax number is [phone]

## 2019-06-30 ENCOUNTER — Ambulatory Visit: Payer: Self-pay | Admitting: Adult Health

## 2019-07-11 ENCOUNTER — Telehealth: Payer: Self-pay

## 2019-07-11 ENCOUNTER — Ambulatory Visit: Payer: 59 | Admitting: Adult Health

## 2019-07-11 ENCOUNTER — Telehealth: Payer: Self-pay | Admitting: Adult Health

## 2019-07-11 ENCOUNTER — Encounter: Payer: Self-pay | Admitting: Adult Health

## 2019-07-11 NOTE — Telephone Encounter (Signed)
Patient was a no call/no show for their appointment today.   

## 2019-07-11 NOTE — Telephone Encounter (Signed)
Patient no showed her appointment today. This is the 3rd no show.   11/23/18, 06/30/19, and 07/11/19  Please dimiss from our office d/t 3 no shows.

## 2019-07-12 ENCOUNTER — Encounter: Payer: Self-pay | Admitting: Adult Health

## 2020-05-10 DIAGNOSIS — Z419 Encounter for procedure for purposes other than remedying health state, unspecified: Secondary | ICD-10-CM | POA: Diagnosis not present

## 2020-06-10 DIAGNOSIS — Z419 Encounter for procedure for purposes other than remedying health state, unspecified: Secondary | ICD-10-CM | POA: Diagnosis not present

## 2020-07-11 DIAGNOSIS — Z419 Encounter for procedure for purposes other than remedying health state, unspecified: Secondary | ICD-10-CM | POA: Diagnosis not present

## 2020-08-10 DIAGNOSIS — Z419 Encounter for procedure for purposes other than remedying health state, unspecified: Secondary | ICD-10-CM | POA: Diagnosis not present

## 2020-09-10 DIAGNOSIS — Z419 Encounter for procedure for purposes other than remedying health state, unspecified: Secondary | ICD-10-CM | POA: Diagnosis not present

## 2020-10-10 DIAGNOSIS — Z419 Encounter for procedure for purposes other than remedying health state, unspecified: Secondary | ICD-10-CM | POA: Diagnosis not present

## 2020-11-05 ENCOUNTER — Other Ambulatory Visit: Payer: Self-pay | Admitting: Physician Assistant

## 2020-11-05 DIAGNOSIS — Z9884 Bariatric surgery status: Secondary | ICD-10-CM

## 2020-11-10 DIAGNOSIS — Z419 Encounter for procedure for purposes other than remedying health state, unspecified: Secondary | ICD-10-CM | POA: Diagnosis not present

## 2020-12-11 DIAGNOSIS — Z419 Encounter for procedure for purposes other than remedying health state, unspecified: Secondary | ICD-10-CM | POA: Diagnosis not present

## 2021-01-08 DIAGNOSIS — Z419 Encounter for procedure for purposes other than remedying health state, unspecified: Secondary | ICD-10-CM | POA: Diagnosis not present

## 2021-02-08 DIAGNOSIS — Z419 Encounter for procedure for purposes other than remedying health state, unspecified: Secondary | ICD-10-CM | POA: Diagnosis not present

## 2021-03-10 DIAGNOSIS — Z419 Encounter for procedure for purposes other than remedying health state, unspecified: Secondary | ICD-10-CM | POA: Diagnosis not present

## 2021-03-12 ENCOUNTER — Ambulatory Visit (INDEPENDENT_AMBULATORY_CARE_PROVIDER_SITE_OTHER): Payer: 59 | Admitting: Nurse Practitioner

## 2021-03-12 ENCOUNTER — Other Ambulatory Visit (HOSPITAL_COMMUNITY)
Admission: RE | Admit: 2021-03-12 | Discharge: 2021-03-12 | Disposition: A | Discharge status: Home / Self Care | Payer: 59 | Source: Ambulatory Visit | Attending: Nurse Practitioner | Admitting: Nurse Practitioner

## 2021-03-12 ENCOUNTER — Encounter: Payer: Self-pay | Admitting: Nurse Practitioner

## 2021-03-12 ENCOUNTER — Other Ambulatory Visit: Payer: Self-pay

## 2021-03-12 VITALS — BP 132/84 | Ht 65.0 in | Wt 350.0 lb

## 2021-03-12 DIAGNOSIS — Z01419 Encounter for gynecological examination (general) (routine) without abnormal findings: Secondary | ICD-10-CM | POA: Diagnosis present

## 2021-03-12 DIAGNOSIS — R232 Flushing: Secondary | ICD-10-CM | POA: Diagnosis not present

## 2021-03-12 DIAGNOSIS — Z975 Presence of (intrauterine) contraceptive device: Secondary | ICD-10-CM | POA: Diagnosis not present

## 2021-03-12 DIAGNOSIS — N912 Amenorrhea, unspecified: Secondary | ICD-10-CM | POA: Diagnosis not present

## 2021-03-12 NOTE — Progress Notes (Signed)
   [place] B Kawa Jan 17, 1971 [id number]   History:  50 y.o. [id number] presents as new patient to establish care. Mirena IUD placed in 2017. Irregular spotting with IUD but has not had any bleeding in a few months and is experiencing hot flashes, night sweats, and mood changes. She would like IUD exchange is necessary. Normal pap and mammogram history.  History of HTN, diabetes - on no medications at this time, and bariatric surgery.  Gynecologic History No LMP recorded. (Menstrual status: IUD).   Contraception/Family planning: IUD  Health Maintenance Last Pap: 5 years ago per patient. Results were: normal Last mammogram: 5 years ago per patient. Results were: normal Last colonoscopy: Never Last Dexa: N/A  Past medical history, past surgical history, family history and social history were all reviewed and documented in the EPIC chart. Married. 4 children ages 50-50.  ROS:  A ROS was performed and pertinent positives and negatives are included.  Exam:  Vitals:   03/12/21 1608  BP: 132/84  Weight: (!) 350 lb (158.8 kg)  Height: 5\' 5"  (1.651 m)   Body mass index is 58.24 kg/m.  General appearance:  Normal Thyroid:  Symmetrical, normal in size, without palpable masses or nodularity. Respiratory  Auscultation:  Clear without wheezing or rhonchi Cardiovascular  Auscultation:  Regular rate, without rubs, murmurs or gallops  Edema/varicosities:  Not grossly evident Abdominal  Soft,nontender, without masses, guarding or rebound.  Liver/spleen:  No organomegaly noted  Hernia:  None appreciated  Skin  Inspection:  Grossly normal Breasts: Examined lying and sitting.   Right: Without masses, retractions, nipple discharge or axillary adenopathy.   Left: Without masses, retractions, nipple discharge or axillary adenopathy. Gentitourinary   Inguinal/mons:  Normal without inguinal adenopathy  External genitalia:  Normal appearing vulva with no masses, tenderness, or  lesions  BUS/Urethra/Skene's glands:  Normal  Vagina:  Normal appearing with normal color and discharge, no lesions  Cervix:  Normal appearing without discharge or lesions. IUD string not visible  Uterus:  Anteverted. Difficult to palpate due to body habitus but no grosses masses or tenderness  Adnexa/parametria:     Rt: Normal in size, without masses or tenderness.   Lt: Normal in size, without masses or tenderness.  Anus and perineum: Normal  Digital rectal exam: Normal sphincter tone without palpated masses or tenderness  Assessment/Plan:  50 y.o. 44 for annual exam.   Well female exam with routine gynecological exam - Plan: Cytology - PAP( [facility]). Education provided on SBEs, importance of preventative screenings, current guidelines, high calcium diet, regular exercise, and multivitamin daily. Labs with PCP.   IUD (intrauterine device) in place - Mirena inserted in 2017 for menorrhagia. Would like exchange if necessary. If FSH in postmenopausal range we will remove, if low we will exchange. She is agreeable.   Hot flashes - Plan: Follicle stimulating hormone. Having hot flashes, night sweats, and mood changes.   Amenorrhea - Plan: Follicle stimulating hormone.  Screening for cervical cancer - Normal Pap history.  Pap with HR HPV today.  Screening for breast cancer - Normal mammogram history. Overdue for mammogram. Information provided on [facility] and she will schedule this soon. Normal breast exam today.  Screening for colon cancer - Has not had screening colonoscopy. Information provided on [facility] and encouraged to schedule this soon.   Return in 1 year for annual.    2018 DNP, 4:25 PM 03/12/2021

## 2021-03-12 NOTE — Patient Instructions (Addendum)
Schedule mammogram! [facility] [phone] [address]  [place], [place] [address]  Schedule colonoscopy! [facility] [phone] [address] [place],  [address]  Health Maintenance, Female Adopting a healthy lifestyle and getting preventive care are important in promoting health and wellness. Ask your health care provider about:  The right schedule for you to have regular tests and exams.  Things you can do on your own to prevent diseases and keep yourself healthy. What should I know about diet, weight, and exercise? Eat a healthy diet  Eat a diet that includes plenty of vegetables, fruits, low-fat dairy products, and lean protein.  Do not eat a lot of foods that are high in solid fats, added sugars, or sodium.   Maintain a healthy weight Body mass index (BMI) is used to identify weight problems. It estimates body fat based on height and weight. Your health care provider can help determine your BMI and help you achieve or maintain a healthy weight. Get regular exercise Get regular exercise. This is one of the most important things you can do for your health. Most adults should:  Exercise for at least 150 minutes each week. The exercise should increase your heart rate and make you sweat (moderate-intensity exercise).  Do strengthening exercises at least twice a week. This is in addition to the moderate-intensity exercise.  Spend less time sitting. Even light physical activity can be beneficial. Watch cholesterol and blood lipids Have your blood tested for lipids and cholesterol at 50 years of age, then have this test every 5 years. Have your cholesterol levels checked more often if:  Your lipid or cholesterol levels are high.  You are older than 50 years of age.  You are at high risk for heart disease. What should I know about cancer screening? Depending on your health history and family history, you may need to have cancer screening  at various ages. This may include screening for:  Breast cancer.  Cervical cancer.  Colorectal cancer.  Skin cancer.  Lung cancer. What should I know about heart disease, diabetes, and high blood pressure? Blood pressure and heart disease  High blood pressure causes heart disease and increases the risk of stroke. This is more likely to develop in people who have high blood pressure readings, are of African descent, or are overweight.  Have your blood pressure checked: ? Every 3-5 years if you are 18-39 years of age. ? Every year if you are 40 years old or older. Diabetes Have regular diabetes screenings. This checks your fasting blood sugar level. Have the screening done:  Once every three years after age 40 if you are at a normal weight and have a low risk for diabetes.  More often and at a younger age if you are overweight or have a high risk for diabetes. What should I know about preventing infection? Hepatitis B If you have a higher risk for hepatitis B, you should be screened for this virus. Talk with your health care provider to find out if you are at risk for hepatitis B infection. Hepatitis C Testing is recommended for:  Everyone born from 1945 through 1965.  Anyone with known risk factors for hepatitis C. Sexually transmitted infections (STIs)  Get screened for STIs, including gonorrhea and chlamydia, if: ? You are sexually active and are younger than 50 years of age. ? You are older than 50 years of age and your health care provider tells you that you are   at risk for this type of infection. ? Your sexual activity has changed since you were last screened, and you are at increased risk for chlamydia or gonorrhea. Ask your health care provider if you are at risk.  Ask your health care provider about whether you are at high risk for HIV. Your health care provider may recommend a prescription medicine to help prevent HIV infection. If you choose to take medicine to  prevent HIV, you should first get tested for HIV. You should then be tested every 3 months for as long as you are taking the medicine. Pregnancy  If you are about to stop having your period (premenopausal) and you may become pregnant, seek counseling before you get pregnant.  Take 400 to 800 micrograms (mcg) of folic acid every day if you become pregnant.  Ask for birth control (contraception) if you want to prevent pregnancy. Osteoporosis and menopause Osteoporosis is a disease in which the bones lose minerals and strength with aging. This can result in bone fractures. If you are 65 years old or older, or if you are at risk for osteoporosis and fractures, ask your health care provider if you should:  Be screened for bone loss.  Take a calcium or vitamin D supplement to lower your risk of fractures.  Be given hormone replacement therapy (HRT) to treat symptoms of menopause. Follow these instructions at home: Lifestyle  Do not use any products that contain nicotine or tobacco, such as cigarettes, e-cigarettes, and chewing tobacco. If you need help quitting, ask your health care provider.  Do not use street drugs.  Do not share needles.  Ask your health care provider for help if you need support or information about quitting drugs. Alcohol use  Do not drink alcohol if: ? Your health care provider tells you not to drink. ? You are pregnant, may be pregnant, or are planning to become pregnant.  If you drink alcohol: ? Limit how much you use to 0-1 drink a day. ? Limit intake if you are breastfeeding.  Be aware of how much alcohol is in your drink. In the [place] one drink equals one 12 oz bottle of beer (355 mL), one 5 oz glass of wine (148 mL), or one 1 oz glass of hard liquor (44 mL). General instructions  Schedule regular health, dental, and eye exams.  Stay current with your vaccines.  Tell your health care provider if: ? You often feel depressed. ? You have ever been  abused or do not feel safe at home. Summary  Adopting a healthy lifestyle and getting preventive care are important in promoting health and wellness.  Follow your health care provider's instructions about healthy diet, exercising, and getting tested or screened for diseases.  Follow your health care provider's instructions on monitoring your cholesterol and blood pressure. This information is not intended to replace advice given to you by your health care provider. Make sure you discuss any questions you have with your health care provider. Document Revised: 10/20/2018 Document Reviewed: 10/20/2018 Elsevier Patient Education  2021 Elsevier Inc.  

## 2021-03-13 LAB — FOLLICLE STIMULATING HORMONE: FSH: 5.5 m[IU]/mL

## 2021-03-14 LAB — CYTOLOGY - PAP
Comment: NEGATIVE
Diagnosis: NEGATIVE
High risk HPV: NEGATIVE

## 2021-03-20 ENCOUNTER — Ambulatory Visit: Payer: 59 | Admitting: Obstetrics & Gynecology

## 2021-04-10 DIAGNOSIS — Z419 Encounter for procedure for purposes other than remedying health state, unspecified: Secondary | ICD-10-CM | POA: Diagnosis not present

## 2021-05-09 ENCOUNTER — Encounter: Payer: Self-pay | Admitting: Obstetrics & Gynecology

## 2021-05-09 ENCOUNTER — Other Ambulatory Visit: Payer: Self-pay

## 2021-05-09 ENCOUNTER — Ambulatory Visit (INDEPENDENT_AMBULATORY_CARE_PROVIDER_SITE_OTHER): Payer: 59 | Admitting: Obstetrics & Gynecology

## 2021-05-09 VITALS — BP 110/80 | HR 78 | Resp 16

## 2021-05-09 DIAGNOSIS — Z30433 Encounter for removal and reinsertion of intrauterine contraceptive device: Secondary | ICD-10-CM

## 2021-05-09 DIAGNOSIS — Z8742 Personal history of other diseases of the female genital tract: Secondary | ICD-10-CM

## 2021-05-09 NOTE — Progress Notes (Signed)
Michele Craig 07-Sep-1971 [id number]        50 y.o.  G4P4L4   RP: Mirena IUD removal and insertion  HPI: Mirena IUD x 2017.  No BTB.  No vaginal d/c.  No pelvic pain.  Per patient, h/o Ovarian Cyst.   OB History  Gravida Para Term Preterm AB Living  4 4 4     4   SAB IAB Ectopic Multiple Live Births               # Outcome Date GA Lbr Len/2nd Weight Sex Delivery Anes PTL Lv  4 Term           3 Term           2 Term           1 Term             Past medical history,surgical history, problem list, medications, allergies, family history and social history were all reviewed and documented in the EPIC chart.   Directed ROS with pertinent positives and negatives documented in the history of present illness/assessment and plan.  Exam:  Vitals:   05/09/21 1534  BP: 110/80  Pulse: 78  Resp: 16   General appearance:  Normal                                                                    IUD procedure note       Patient presented to the office today for removal and placement of Mirena IUD. The patient had previously been provided with literature information on this method of contraception. The risks benefits and pros and cons were discussed and all her questions were answered. She is fully aware that this form of contraception is 99% effective and is good for 5-7 years.  Pelvic exam: Vulva normal Vagina: No lesions or discharge Cervix: No lesions or discharge.  IUD strings visible just inside the EO.  Grasped with a ring forceps, mild resistance, but successful removal of complete and intact IUD, shown to patient and discarded. Uterus: AV position Adnexa: No masses or tenderness Rectal exam: Not done  The cervix was cleansed with Betadine solution. Hurricane spray on the cervix.  A single-tooth tenaculum was placed on the anterior cervical lip. Os finder to mildly dilate the cervix, find the cervical curve and do hysterometry which was at 9 cm.  The IUD was shown to the  patient and inserted in a sterile fashion. The IUD string was trimmed. The single-tooth tenaculum was removed. Patient was instructed to return back to the office in one month for follow up.         Assessment/Plan:  50 y.o. 50   1. Encounter for IUD removal and reinsertion Removal of Mirena IUD well tolerated without Cx.  IUD complete/intact.  New Mirena IUD inserted without difficulty.  No Cx.  Post procedure precautions reviewed.  F/U IUD check.  2. History of ovarian cyst H/O ovarian cyst a few years ago per patient.  Recommendation was made to patient to f/u with a Pelvic [id number].  Will schedule a Pelvic US here now. - US Transvaginal Non-OB; Future  Other orders - traZODone (DESYREL) 100 MG tablet; Take 100 mg by mouth at bedtime. -  topiramate (TOPAMAX) 50 MG tablet; Take 50 mg by mouth daily. - SUMAtriptan (IMITREX) 100 MG tablet; SMARTSIG:1 Tablet(s) By Mouth 1-2 Times Daily - phentermine 15 MG capsule; Take 15 mg by mouth daily. - levonorgestrel (MIRENA, 52 MG,) 20 MCG/DAY IUD; Mirena 20 mcg/24 hours (7 yrs) 52 mg intrauterine device  Take 1 device by intrauterine route. - levocetirizine (XYZAL) 5 MG tablet; SMARTSIG:1 Tablet(s) By Mouth Every Evening - hydroxychloroquine (PLAQUENIL) 200 MG tablet; Take 400 mg by mouth daily. - fluticasone (FLOVENT HFA) 44 MCG/ACT inhaler; Inhale 2 puffs into the lungs 2 (two) times daily. - fluticasone (FLONASE) 50 MCG/ACT nasal spray; Place 1 spray into both nostrils daily. - CIMZIA PREFILLED 2 X 200 MG/ML PSKT; Inject into the skin. - Biotin 10 MG CAPS; Take by mouth. - beclomethasone (QVAR REDIHALER) 80 MCG/ACT inhaler; Inhale into the lungs. - ARIPiprazole (ABILIFY) 10 MG tablet; Take 10 mg by mouth every morning. - gabapentin (NEURONTIN) 600 MG tablet; Take 600 mg by mouth 2 (two) times daily.   Genia Del MD, 3:41 PM 05/09/2021

## 2021-05-10 ENCOUNTER — Encounter: Payer: Self-pay | Admitting: Obstetrics & Gynecology

## 2021-05-10 ENCOUNTER — Telehealth: Payer: Self-pay | Admitting: *Deleted

## 2021-05-10 DIAGNOSIS — Z419 Encounter for procedure for purposes other than remedying health state, unspecified: Secondary | ICD-10-CM | POA: Diagnosis not present

## 2021-05-10 MED ORDER — IBUPROFEN 800 MG PO TABS
800.0000 mg | ORAL_TABLET | Freq: Three times a day (TID) | ORAL | 0 refills | Status: DC | PRN
Start: 2021-05-10 — End: 2021-07-16

## 2021-05-10 NOTE — Telephone Encounter (Signed)
Patient called had IUD removed and new one inserted yesterday. Called today c/o severe cramping reports this new for her, last IUD she did not have severe cramping. She is taking tylenol for cramping which helps some,but she is worried if this normal, as it did not happen last time. Patient asked if you can send in stronger Rx to help with this? Please advise

## 2021-05-10 NOTE — Telephone Encounter (Signed)
Dr.Lavoie replied "Recommend Ibuprofen 800 mg PO every 8 hours regularly x 2 days.  Can send a prescription or OTC"  Patient aware, Rx sent.

## 2021-06-10 DIAGNOSIS — Z419 Encounter for procedure for purposes other than remedying health state, unspecified: Secondary | ICD-10-CM | POA: Diagnosis not present

## 2021-07-05 DIAGNOSIS — R32 Unspecified urinary incontinence: Secondary | ICD-10-CM | POA: Diagnosis not present

## 2021-07-11 DIAGNOSIS — Z419 Encounter for procedure for purposes other than remedying health state, unspecified: Secondary | ICD-10-CM | POA: Diagnosis not present

## 2021-07-16 ENCOUNTER — Encounter: Payer: Self-pay | Admitting: Obstetrics & Gynecology

## 2021-07-16 ENCOUNTER — Other Ambulatory Visit: Payer: Self-pay

## 2021-07-16 ENCOUNTER — Ambulatory Visit (INDEPENDENT_AMBULATORY_CARE_PROVIDER_SITE_OTHER): Payer: 59

## 2021-07-16 ENCOUNTER — Ambulatory Visit (INDEPENDENT_AMBULATORY_CARE_PROVIDER_SITE_OTHER): Payer: 59 | Admitting: Obstetrics & Gynecology

## 2021-07-16 VITALS — BP 118/70

## 2021-07-16 DIAGNOSIS — Z8742 Personal history of other diseases of the female genital tract: Secondary | ICD-10-CM

## 2021-07-16 DIAGNOSIS — Z30431 Encounter for routine checking of intrauterine contraceptive device: Secondary | ICD-10-CM

## 2021-07-16 NOTE — Progress Notes (Signed)
    [place] B Hulce 1971/08/03 [id number]        50 y.o.  [id number]   RP: IUD check and h/o Ovarian cysts for Pelvic US  HPI: Well since IUD insertion in 04/2021.  No breakthrough bleeding.  No pelvic pain.   OB History  Gravida Para Term Preterm AB Living  4 4 4     4   SAB IAB Ectopic Multiple Live Births               # Outcome Date GA Lbr Len/2nd Weight Sex Delivery Anes PTL Lv  4 Term           3 Term           2 Term           1 Term             Past medical history,surgical history, problem list, medications, allergies, family history and social history were all reviewed and documented in the EPIC chart.   Directed ROS with pertinent positives and negatives documented in the history of present illness/assessment and plan.  Exam:  There were no vitals filed for this visit. General appearance:  Normal  Pelvic today: T/V images.  Anteverted elongated uterus with inhomogeneous myometrium with streaky shadowing suggestive of adenomyosis.  The uterus is measured at 11.99 x 5.94 x 6.39 cm.  The IUD is noted in normal lower uterine segment position near the C-section scar.  No obvious endometrial thickening or mass.  Right ovary with a 3.3 x 3.0 cm simple unilocular cyst.  Left ovary with a 4.4 x 2.7 cm simple unilocular cyst.  No free fluid in the posterior cul-de-sac.   Assessment/Plan:  50 y.o. 44   1. IUD check up IUD, found in good intra uterine position by pelvic ultrasound.  IUD well-tolerated by patient.  Patient reassured.  2. History of ovarian cyst  No pelvic pain.  Bilateral simple follicular type of ovarian cyst.  Patient reassured.  Precautions discussed, will call back for reevaluation if develops pelvic pain.  [id number] MD, 4:45 PM 07/16/2021

## 2021-07-19 ENCOUNTER — Encounter: Payer: Self-pay | Admitting: Obstetrics & Gynecology

## 2021-08-10 DIAGNOSIS — Z419 Encounter for procedure for purposes other than remedying health state, unspecified: Secondary | ICD-10-CM | POA: Diagnosis not present

## 2021-09-10 DIAGNOSIS — Z419 Encounter for procedure for purposes other than remedying health state, unspecified: Secondary | ICD-10-CM | POA: Diagnosis not present

## 2021-10-09 ENCOUNTER — Emergency Department (HOSPITAL_COMMUNITY): Payer: 59

## 2021-10-09 ENCOUNTER — Emergency Department (HOSPITAL_COMMUNITY)
Admission: EM | Admit: 2021-10-09 | Discharge: 2021-10-09 | Disposition: A | Discharge status: Home / Self Care | Payer: 59 | Attending: Emergency Medicine | Admitting: Emergency Medicine

## 2021-10-09 ENCOUNTER — Other Ambulatory Visit: Payer: Self-pay

## 2021-10-09 ENCOUNTER — Encounter (HOSPITAL_COMMUNITY): Payer: Self-pay

## 2021-10-09 DIAGNOSIS — E119 Type 2 diabetes mellitus without complications: Secondary | ICD-10-CM | POA: Insufficient documentation

## 2021-10-09 DIAGNOSIS — J101 Influenza due to other identified influenza virus with other respiratory manifestations: Secondary | ICD-10-CM | POA: Diagnosis not present

## 2021-10-09 DIAGNOSIS — Z20822 Contact with and (suspected) exposure to covid-19: Secondary | ICD-10-CM | POA: Diagnosis not present

## 2021-10-09 DIAGNOSIS — R059 Cough, unspecified: Secondary | ICD-10-CM | POA: Diagnosis present

## 2021-10-09 DIAGNOSIS — Z7951 Long term (current) use of inhaled steroids: Secondary | ICD-10-CM | POA: Diagnosis not present

## 2021-10-09 DIAGNOSIS — I10 Essential (primary) hypertension: Secondary | ICD-10-CM | POA: Diagnosis not present

## 2021-10-09 DIAGNOSIS — J45909 Unspecified asthma, uncomplicated: Secondary | ICD-10-CM | POA: Diagnosis not present

## 2021-10-09 LAB — RESP PANEL BY RT-PCR (FLU A&B, COVID) ARPGX2
Influenza A by PCR: POSITIVE — AB
Influenza B by PCR: NEGATIVE
SARS Coronavirus 2 by RT PCR: NEGATIVE

## 2021-10-09 MED ORDER — DEXAMETHASONE SODIUM PHOSPHATE 10 MG/ML IJ SOLN: 12.0000 mg | Freq: Once | INTRAMUSCULAR | Status: DC

## 2021-10-09 MED ORDER — IPRATROPIUM-ALBUTEROL 0.5-2.5 (3) MG/3ML IN SOLN
3.0000 mL | Freq: Once | RESPIRATORY_TRACT | Status: AC
  Administered 2021-10-09: 3 mL via RESPIRATORY_TRACT
  Filled 2021-10-09: qty 3

## 2021-10-09 MED ORDER — DEXAMETHASONE SODIUM PHOSPHATE 10 MG/ML IJ SOLN: 20.0000 mg | Freq: Once | INTRAMUSCULAR | Status: DC

## 2021-10-09 MED ORDER — OSELTAMIVIR PHOSPHATE 75 MG PO CAPS: 75.0000 mg | ORAL_CAPSULE | Freq: Two times a day (BID) | ORAL | 0 refills | Status: DC

## 2021-10-09 MED ORDER — DEXAMETHASONE SODIUM PHOSPHATE 10 MG/ML IJ SOLN
12.0000 mg | Freq: Once | INTRAMUSCULAR | Status: AC
  Administered 2021-10-09: 12 mg via INTRAMUSCULAR
  Filled 2021-10-09: qty 2

## 2021-10-09 NOTE — ED Triage Notes (Signed)
Pt reports catching a cold from her son over a week ago and now reports increase in cough and wheezing over the past few days. She reports using her inhaler and breathing treatment with minimal relief.

## 2021-10-09 NOTE — Discharge Instructions (Signed)
You have been diagnosed with the flu today.  I have sent medication to the pharmacy however usually this medication works the best within 48 hours.  Common side effects are nausea, vomiting and diarrhea.  You are contagious until you go 24 hours without a fever.  Treat your symptoms with over-the-counter medications as well as your albuterol as needed.  You have been given a steroid shot, and do not need a burst of steroids.  It was a pleasure to meet you and I hope that you feel better.

## 2021-10-09 NOTE — ED Provider Notes (Signed)
[facility]-EMERGENCY DEPT Provider Note   CSN: [id number] Arrival date & time: 10/09/21  1100     History Chief Complaint  Patient presents with   Asthma    Michele Craig is a 50 y.o. female presenting today with a complaint of cough and wheezing for the past few days.  A few weeks ago her son visited and got her and her husband sick with a URI.  Congestion, sore throat, fatigue, etc.  Son tested negative for flu and COVID at that time.  Patient did not undergo any viral testing.  She began to feel better however over the past few days she has had chest tightness and difficulty getting a deep breath.  Has a history of asthma.  Has been using her albuterol inhalers and nebulizers without help.  Cough is productive.  Endorses chills.  No at home testing.  Has never had a blood clot, no leg swelling, no recent travel or surgery.    Past Medical History:  Diagnosis Date   Anemia    Anxiety    Arthritis    Asthma    Complication of anesthesia    with carpal tunnel syndrome the block didn't work well, she could feel things during the surgery   Depression    Diabetes mellitus    pt states after her gastric sleeve procedure she is no longer on medications and is not checked for it   Family history of adverse reaction to anesthesia    unknown, pt is adopted   Fibromyalgia    Foot pain    GERD (gastroesophageal reflux disease)    Headache    Hypertension    IBS (irritable bowel syndrome)    after cholecystectomy and then it went away after Gastric sleeve   Pneumonia    RA (rheumatoid arthritis) (HCC)    Shortness of breath dyspnea    only when her asthma is flaring up   Sleep apnea    does not use cpap    Patient Active Problem List   Diagnosis Date Noted   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   Obesity with alveolar hypoventilation (HCC) 02/01/2019   IUD (intrauterine device) in place 01/22/2017   Abnormal uterine  bleeding (AUB) 01/22/2017   Obstructive sleep apnea 03/08/2013   Morbid obesity (HCC) 04/01/2012   HTN (hypertension) 04/01/2012   DM (diabetes mellitus) (HCC) 04/01/2012   Allergic rhinitis 04/01/2012    Past Surgical History:  Procedure Laterality Date   CARPAL TUNNEL RELEASE     CARPAL TUNNEL RELEASE Right 04/04/2016   Procedure: RIGHT CARPAL TUNNEL RELEASE;  Surgeon: Frederico Hamman, MD;  Location: [facility] OR;  Service: Orthopedics;  Laterality: Right;  Synovectomy    CESAREAN SECTION     x 2   CHOLECYSTECTOMY     deuodenal switch     DILATION AND CURETTAGE OF UTERUS     LAPAROSCOPIC GASTRIC SLEEVE RESECTION     TONSILLECTOMY       OB History     Gravida  4   Para  4   Term  4   Preterm      AB      Living  4      SAB      IAB      Ectopic      Multiple      Live Births              Family History  Adopted:  Yes  Family history unknown: Yes    Social History   Tobacco Use   Smoking status: Never   Smokeless tobacco: Never  Vaping Use   Vaping Use: Never used  Substance Use Topics   Alcohol use: Not Currently   Drug use: No    Home Medications Prior to Admission medications   Medication Sig Start Date End Date Taking? Authorizing Provider  ADVAIR DISKUS 250-50 MCG/DOSE AEPB Inhale 1 puff into the lungs 2 (two) times daily.  05/17/14   [provider]  albuterol (PROVENTIL HFA;VENTOLIN HFA) 108 (90 BASE) MCG/ACT inhaler Inhale 2 puffs into the lungs every 6 (six) hours as needed for wheezing or shortness of breath.    [provider]  ARIPiprazole (ABILIFY) 10 MG tablet Take 10 mg by mouth every morning. 04/18/21   [provider]  buPROPion (WELLBUTRIN XL) 150 MG 24 hr tablet Takes 3 in the morning 05/17/18   [provider]  CIMZIA PREFILLED 2 X 200 MG/ML PSKT Inject into the skin. 04/22/21   [provider]  clonazePAM (KLONOPIN) 2 MG tablet Take 2 mg by mouth at bedtime. 03/03/21   [provider]  DULoxetine (CYMBALTA) 30 MG capsule Take 30 mg by mouth daily. 07/05/21   [provider]  DULoxetine (CYMBALTA) 60 MG capsule Take 60 mg by mouth every evening.     [provider]  fluticasone (FLONASE) 50 MCG/ACT nasal spray Place 1 spray into both nostrils daily. 04/17/21   [provider]  fluticasone (FLOVENT HFA) 44 MCG/ACT inhaler Inhale 2 puffs into the lungs 2 (two) times daily.    [provider]  gabapentin (NEURONTIN) 100 MG capsule Takes 6 po 4 times daily 07/07/21   [provider]  hydroxychloroquine (PLAQUENIL) 200 MG tablet Take 400 mg by mouth daily. 04/27/21   [provider]  levocetirizine (XYZAL) 5 MG tablet SMARTSIG:1 Tablet(s) By Mouth Every Evening 04/30/21   [provider]  levonorgestrel (MIRENA, 52 MG,) 20 MCG/DAY IUD Mirena 20 mcg/24 hours (7 yrs) 52 mg intrauterine device  Take 1 device by intrauterine route.    [provider]  omeprazole (PRILOSEC OTC) 20 MG tablet Take 20 mg by mouth daily.    [provider]  omeprazole (PRILOSEC) 20 MG capsule Take 20 mg by mouth daily. 07/05/21   [provider]  phentermine (ADIPEX-P) 37.5 MG tablet Take 37.5 mg by mouth every morning. 06/25/21   [provider]  phentermine 15 MG capsule Take 15 mg by mouth daily. 02/07/21   [provider]  topiramate (TOPAMAX) 50 MG tablet Take 50 mg by mouth daily. 01/29/21   [provider]  traZODone (DESYREL) 100 MG tablet Take 100 mg by mouth at bedtime. 04/18/21   [provider]    Allergies    Shrimp [shellfish allergy], Penicillins, and Zosyn [piperacillin sod-tazobactam so]  Review of Systems   Review of Systems  Constitutional:  Negative for fever.  HENT:  Positive for congestion.   Respiratory:  Positive for cough, chest tightness and shortness of breath.   Gastrointestinal:  Positive for diarrhea. Negative for nausea and vomiting.  All other systems  reviewed and are negative.  Physical Exam Updated Vital Signs BP 121/84 (BP Location: Right Arm)   Pulse 95   Temp 98.2 F (36.8 C) (Oral)   Resp 18   SpO2 94%   Physical Exam Vitals and nursing note reviewed.  Constitutional:      General: She  is not in acute distress.    Appearance: Normal appearance. She is not ill-appearing.  HENT:     Head: Normocephalic and atraumatic.  Eyes:     General: No scleral icterus.    Conjunctiva/sclera: Conjunctivae normal.  Pulmonary:     Effort: Pulmonary effort is normal. No respiratory distress.     Breath sounds: Wheezing present. No rales.     Comments: Wheezing all throughout lung fields Abdominal:     General: Abdomen is flat.     Palpations: Abdomen is soft.  Skin:    General: Skin is warm and dry.     Findings: No rash.  Neurological:     Mental Status: She is alert.  Psychiatric:        Mood and Affect: Mood normal.    ED Results / Procedures / Treatments   Labs (all labs ordered are listed, but only abnormal results are displayed) Labs Reviewed  RESP PANEL BY RT-PCR (FLU A&B, COVID) [id number]    EKG None  Radiology No results found.  Procedures Procedures   Medications Ordered in ED Medications  ipratropium-albuterol (DUONEB) 0.5-2.5 (3) MG/3ML nebulizer solution 3 mL (has no administration in time range)    ED Course  I have reviewed the triage vital signs and the nursing notes.  Pertinent labs & imaging results that were available during my care of the patient were reviewed by me and considered in my medical decision making (see chart for details).    MDM Rules/Calculators/A&P The emergent differential diagnosis for shortness of breath includes, but is not limited to, Pulmonary edema, bronchoconstriction, Pneumonia, Pulmonary embolism, Pneumotherax/ Hemothorax, Dysrythmia, ACS.  All of these were considered throughout the evaluation of this patient.  Low risk PE.  Not tachycardic or tachypneic.  It is  possible the patient had the flu, COVID or other viral illness weeks ago.  Has begun to feel poorly again over the last few days.  She had a possible exposure to illness over Thanksgiving.   Patient is positive for flu A.  Chest x-ray negative for pneumonia.  Patient sounds and feels better after 2 duo nebs in the department today and she was given a Decadron shot.  Agreeable and stable for discharge home with over-the-counter treatment.  Tamiflu sent to her pharmacy in the event that she wants to try it due to her high risk as an asthmatic.  Final Clinical Impression(s) / ED Diagnoses Final diagnoses:  Influenza A    Rx / DC Orders Results and diagnoses were explained to the patient. Return precautions discussed in full. Patient had no additional questions and expressed complete understanding.     Woodroe Chen 10/09/21 1339    Bethann Berkshire, MD 10/09/21 [phone]

## 2021-10-09 NOTE — ED Notes (Signed)
Patient transported to X-ray 

## 2021-10-10 DIAGNOSIS — Z419 Encounter for procedure for purposes other than remedying health state, unspecified: Secondary | ICD-10-CM | POA: Diagnosis not present

## 2021-11-10 DIAGNOSIS — Z419 Encounter for procedure for purposes other than remedying health state, unspecified: Secondary | ICD-10-CM | POA: Diagnosis not present

## 2021-12-11 DIAGNOSIS — Z419 Encounter for procedure for purposes other than remedying health state, unspecified: Secondary | ICD-10-CM | POA: Diagnosis not present

## 2022-01-08 ENCOUNTER — Observation Stay (HOSPITAL_COMMUNITY)
Admission: EM | Admit: 2022-01-08 | Discharge: 2022-01-12 | Disposition: A | Discharge status: Home Health | Payer: 59 | Attending: Family Medicine | Admitting: Family Medicine

## 2022-01-08 ENCOUNTER — Encounter (HOSPITAL_COMMUNITY): Payer: Self-pay

## 2022-01-08 DIAGNOSIS — F419 Anxiety disorder, unspecified: Secondary | ICD-10-CM

## 2022-01-08 DIAGNOSIS — E119 Type 2 diabetes mellitus without complications: Secondary | ICD-10-CM | POA: Diagnosis not present

## 2022-01-08 DIAGNOSIS — Z20822 Contact with and (suspected) exposure to covid-19: Secondary | ICD-10-CM | POA: Diagnosis not present

## 2022-01-08 DIAGNOSIS — D72829 Elevated white blood cell count, unspecified: Secondary | ICD-10-CM | POA: Insufficient documentation

## 2022-01-08 DIAGNOSIS — Z79899 Other long term (current) drug therapy: Secondary | ICD-10-CM | POA: Insufficient documentation

## 2022-01-08 DIAGNOSIS — R000 Tachycardia, unspecified: Secondary | ICD-10-CM | POA: Insufficient documentation

## 2022-01-08 DIAGNOSIS — R0602 Shortness of breath: Secondary | ICD-10-CM | POA: Diagnosis present

## 2022-01-08 DIAGNOSIS — M797 Fibromyalgia: Secondary | ICD-10-CM

## 2022-01-08 DIAGNOSIS — I10 Essential (primary) hypertension: Secondary | ICD-10-CM | POA: Insufficient documentation

## 2022-01-08 DIAGNOSIS — Z419 Encounter for procedure for purposes other than remedying health state, unspecified: Secondary | ICD-10-CM | POA: Diagnosis not present

## 2022-01-08 DIAGNOSIS — G43909 Migraine, unspecified, not intractable, without status migrainosus: Secondary | ICD-10-CM | POA: Diagnosis not present

## 2022-01-08 DIAGNOSIS — J45901 Unspecified asthma with (acute) exacerbation: Secondary | ICD-10-CM | POA: Diagnosis not present

## 2022-01-08 DIAGNOSIS — K219 Gastro-esophageal reflux disease without esophagitis: Secondary | ICD-10-CM

## 2022-01-08 DIAGNOSIS — I011 Acute rheumatic endocarditis: Secondary | ICD-10-CM

## 2022-01-08 NOTE — ED Triage Notes (Signed)
Pt complaining of feeling tight when she breathes, albuterol is not helping to open the airways, has gotten worse since Tuesday ?

## 2022-01-09 ENCOUNTER — Other Ambulatory Visit: Payer: Self-pay

## 2022-01-09 ENCOUNTER — Emergency Department (HOSPITAL_COMMUNITY): Payer: 59

## 2022-01-09 DIAGNOSIS — M797 Fibromyalgia: Secondary | ICD-10-CM

## 2022-01-09 DIAGNOSIS — K219 Gastro-esophageal reflux disease without esophagitis: Secondary | ICD-10-CM

## 2022-01-09 DIAGNOSIS — F419 Anxiety disorder, unspecified: Secondary | ICD-10-CM

## 2022-01-09 DIAGNOSIS — I011 Acute rheumatic endocarditis: Secondary | ICD-10-CM

## 2022-01-09 DIAGNOSIS — J4521 Mild intermittent asthma with (acute) exacerbation: Secondary | ICD-10-CM

## 2022-01-09 DIAGNOSIS — G43909 Migraine, unspecified, not intractable, without status migrainosus: Secondary | ICD-10-CM

## 2022-01-09 DIAGNOSIS — J45901 Unspecified asthma with (acute) exacerbation: Secondary | ICD-10-CM | POA: Diagnosis present

## 2022-01-09 LAB — BASIC METABOLIC PANEL
Anion gap: 5 (ref 5–15)
BUN: 12 mg/dL (ref 6–20)
CO2: 21 mmol/L — ABNORMAL LOW (ref 22–32)
Calcium: 8.4 mg/dL — ABNORMAL LOW (ref 8.9–10.3)
Chloride: 109 mmol/L (ref 98–111)
Creatinine, Ser: 0.83 mg/dL (ref 0.44–1.00)
GFR, Estimated: >60 mL/min (ref >60)
Glucose, Bld: 129 mg/dL — ABNORMAL HIGH (ref 70–99)
Potassium: 3.8 mmol/L (ref 3.5–5.1)
Sodium: 135 mmol/L (ref 135–145)

## 2022-01-09 LAB — HEMOGLOBIN A1C
Hgb A1c MFr Bld: 4.6 % — ABNORMAL LOW (ref 4.8–5.6)
Mean Plasma Glucose: 85.32 mg/dL

## 2022-01-09 LAB — RESP PANEL BY RT-PCR (FLU A&B, COVID) ARPGX2
Influenza A by PCR: NEGATIVE
Influenza B by PCR: NEGATIVE
SARS Coronavirus 2 by RT PCR: NEGATIVE

## 2022-01-09 LAB — GLUCOSE, CAPILLARY
Glucose-Capillary: 141 mg/dL — ABNORMAL HIGH (ref 70–99)
Glucose-Capillary: 122 mg/dL — ABNORMAL HIGH (ref 70–99)
Glucose-Capillary: 193 mg/dL — ABNORMAL HIGH (ref 70–99)

## 2022-01-09 LAB — CBC WITH DIFFERENTIAL/PLATELET
Abs Immature Granulocytes: 0.04 10*3/uL (ref 0.00–0.07)
Basophils Absolute: 0.1 10*3/uL (ref 0.0–0.1)
Basophils Relative: 1 %
Eosinophils Absolute: 0 10*3/uL (ref 0.0–0.5)
Eosinophils Relative: 0 %
HCT: 44.5 % (ref 36.0–46.0)
Hemoglobin: 14.7 g/dL (ref 12.0–15.0)
Immature Granulocytes: 1 %
Lymphocytes Relative: 13 %
Lymphs Abs: 0.9 10*3/uL (ref 0.7–4.0)
MCH: 29.6 pg (ref 26.0–34.0)
MCHC: 33 g/dL (ref 30.0–36.0)
MCV: 89.7 fL (ref 80.0–100.0)
Monocytes Absolute: 0.2 10*3/uL (ref 0.1–1.0)
Monocytes Relative: 2 %
Neutro Abs: 5.6 10*3/uL (ref 1.7–7.7)
Neutrophils Relative %: 83 %
Platelets: 289 10*3/uL (ref 150–400)
RBC: 4.96 MIL/uL (ref 3.87–5.11)
RDW: 13 % (ref 11.5–15.5)
WBC: 6.8 10*3/uL (ref 4.0–10.5)
nRBC: 0 % (ref 0.0–0.2)

## 2022-01-09 MED ORDER — HYDROCODONE-ACETAMINOPHEN 7.5-325 MG PO TABS
1.0000 | ORAL_TABLET | Freq: Four times a day (QID) | ORAL | Status: DC | PRN
  Administered 2022-01-09 – 2022-01-12 (×5): 1 via ORAL
  Filled 2022-01-09 (×5): qty 1

## 2022-01-09 MED ORDER — FLUTICASONE PROPIONATE HFA 44 MCG/ACT IN AERO: 2.0000 | INHALATION_SPRAY | Freq: Every day | RESPIRATORY_TRACT | Status: DC | PRN

## 2022-01-09 MED ORDER — BUPROPION HCL ER (XL) 300 MG PO TB24
300.0000 mg | ORAL_TABLET | Freq: Every day | ORAL | Status: DC
  Administered 2022-01-09 – 2022-01-11 (×3): 300 mg via ORAL
  Filled 2022-01-09: qty 2
  Filled 2022-01-09 (×2): qty 1

## 2022-01-09 MED ORDER — IPRATROPIUM-ALBUTEROL 0.5-2.5 (3) MG/3ML IN SOLN
3.0000 mL | Freq: Four times a day (QID) | RESPIRATORY_TRACT | Status: DC | PRN
  Administered 2022-01-10 – 2022-01-11 (×3): 3 mL via RESPIRATORY_TRACT
  Filled 2022-01-09 (×3): qty 3

## 2022-01-09 MED ORDER — METHYLPREDNISOLONE SODIUM SUCC 125 MG IJ SOLR
60.0000 mg | Freq: Two times a day (BID) | INTRAMUSCULAR | Status: AC
  Administered 2022-01-09 (×2): 60 mg via INTRAVENOUS
  Filled 2022-01-09 (×2): qty 2

## 2022-01-09 MED ORDER — IPRATROPIUM-ALBUTEROL 0.5-2.5 (3) MG/3ML IN SOLN
3.0000 mL | Freq: Once | RESPIRATORY_TRACT | Status: AC
  Administered 2022-01-09: 3 mL via RESPIRATORY_TRACT
  Filled 2022-01-09: qty 3

## 2022-01-09 MED ORDER — ONDANSETRON HCL 4 MG/2ML IJ SOLN: 4.0000 mg | Freq: Four times a day (QID) | INTRAMUSCULAR | Status: DC | PRN

## 2022-01-09 MED ORDER — PREDNISONE 20 MG PO TABS
40.0000 mg | ORAL_TABLET | Freq: Every day | ORAL | Status: DC
  Administered 2022-01-10 – 2022-01-12 (×3): 40 mg via ORAL
  Filled 2022-01-09 (×4): qty 2

## 2022-01-09 MED ORDER — ONDANSETRON HCL 4 MG PO TABS: 4.0000 mg | ORAL_TABLET | Freq: Four times a day (QID) | ORAL | Status: DC | PRN

## 2022-01-09 MED ORDER — INSULIN ASPART 100 UNIT/ML IJ SOLN
0.0000 [IU] | Freq: Three times a day (TID) | INTRAMUSCULAR | Status: DC
  Administered 2022-01-09 – 2022-01-10 (×4): 2 [IU] via SUBCUTANEOUS
  Filled 2022-01-09: qty 0.15

## 2022-01-09 MED ORDER — GABAPENTIN 400 MG PO CAPS
400.0000 mg | ORAL_CAPSULE | Freq: Two times a day (BID) | ORAL | Status: DC
  Administered 2022-01-09 – 2022-01-12 (×7): 400 mg via ORAL
  Filled 2022-01-09 (×7): qty 1

## 2022-01-09 MED ORDER — METHYLPREDNISOLONE SODIUM SUCC 125 MG IJ SOLR
125.0000 mg | Freq: Once | INTRAMUSCULAR | Status: AC
  Administered 2022-01-09: 125 mg via INTRAVENOUS
  Filled 2022-01-09: qty 2

## 2022-01-09 MED ORDER — DULOXETINE HCL 60 MG PO CPEP
90.0000 mg | ORAL_CAPSULE | Freq: Every day | ORAL | Status: DC
  Administered 2022-01-09 – 2022-01-11 (×3): 90 mg via ORAL
  Filled 2022-01-09 (×3): qty 1

## 2022-01-09 MED ORDER — SUMATRIPTAN SUCCINATE 25 MG PO TABS
100.0000 mg | ORAL_TABLET | ORAL | Status: DC | PRN
  Administered 2022-01-12: 100 mg via ORAL
  Filled 2022-01-09: qty 2
  Filled 2022-01-09: qty 4

## 2022-01-09 MED ORDER — ALBUTEROL SULFATE (2.5 MG/3ML) 0.083% IN NEBU
2.5000 mg | INHALATION_SOLUTION | Freq: Three times a day (TID) | RESPIRATORY_TRACT | Status: DC
  Administered 2022-01-10: 2.5 mg via RESPIRATORY_TRACT
  Filled 2022-01-09: qty 3

## 2022-01-09 MED ORDER — PANTOPRAZOLE SODIUM 40 MG PO TBEC
40.0000 mg | DELAYED_RELEASE_TABLET | Freq: Every day | ORAL | Status: DC
  Administered 2022-01-09 – 2022-01-12 (×4): 40 mg via ORAL
  Filled 2022-01-09 (×4): qty 1

## 2022-01-09 MED ORDER — ENOXAPARIN SODIUM 80 MG/0.8ML IJ SOSY
70.0000 mg | PREFILLED_SYRINGE | INTRAMUSCULAR | Status: DC
  Administered 2022-01-09 – 2022-01-11 (×2): 70 mg via SUBCUTANEOUS
  Filled 2022-01-09 (×2): qty 0.8

## 2022-01-09 MED ORDER — ALBUTEROL SULFATE (2.5 MG/3ML) 0.083% IN NEBU
10.0000 mg/h | INHALATION_SOLUTION | Freq: Once | RESPIRATORY_TRACT | Status: AC
  Administered 2022-01-09: 10 mg/h via RESPIRATORY_TRACT
  Filled 2022-01-09: qty 12

## 2022-01-09 MED ORDER — IPRATROPIUM-ALBUTEROL 0.5-2.5 (3) MG/3ML IN SOLN
3.0000 mL | Freq: Four times a day (QID) | RESPIRATORY_TRACT | Status: DC
  Administered 2022-01-09 (×3): 3 mL via RESPIRATORY_TRACT
  Filled 2022-01-09 (×3): qty 3

## 2022-01-09 MED ORDER — HYDROXYCHLOROQUINE SULFATE 200 MG PO TABS
200.0000 mg | ORAL_TABLET | Freq: Two times a day (BID) | ORAL | Status: DC
  Administered 2022-01-09 – 2022-01-12 (×7): 200 mg via ORAL
  Filled 2022-01-09 (×7): qty 1

## 2022-01-09 MED ORDER — CLONAZEPAM 1 MG PO TABS
2.0000 mg | ORAL_TABLET | Freq: Every day | ORAL | Status: DC
  Administered 2022-01-09 – 2022-01-11 (×3): 2 mg via ORAL
  Filled 2022-01-09 (×3): qty 2

## 2022-01-09 MED ORDER — ARIPIPRAZOLE 10 MG PO TABS
10.0000 mg | ORAL_TABLET | Freq: Every morning | ORAL | Status: DC
  Administered 2022-01-09 – 2022-01-12 (×4): 10 mg via ORAL
  Filled 2022-01-09 (×4): qty 1

## 2022-01-09 MED ORDER — ACETAMINOPHEN 325 MG PO TABS
650.0000 mg | ORAL_TABLET | Freq: Four times a day (QID) | ORAL | Status: DC | PRN
  Administered 2022-01-09 – 2022-01-12 (×2): 650 mg via ORAL
  Filled 2022-01-09 (×3): qty 2

## 2022-01-09 MED ORDER — FLUTICASONE PROPIONATE 50 MCG/ACT NA SUSP
1.0000 | Freq: Every day | NASAL | Status: DC | PRN
  Filled 2022-01-09: qty 16

## 2022-01-09 MED ORDER — MAGNESIUM SULFATE 2 GM/50ML IV SOLN
2.0000 g | Freq: Once | INTRAVENOUS | Status: AC
  Administered 2022-01-09: 2 g via INTRAVENOUS
  Filled 2022-01-09: qty 50

## 2022-01-09 MED ORDER — MONTELUKAST SODIUM 10 MG PO TABS
10.0000 mg | ORAL_TABLET | Freq: Every day | ORAL | Status: DC
  Administered 2022-01-09 – 2022-01-11 (×3): 10 mg via ORAL
  Filled 2022-01-09 (×3): qty 1

## 2022-01-09 MED ORDER — TRAZODONE HCL 100 MG PO TABS
100.0000 mg | ORAL_TABLET | Freq: Every day | ORAL | Status: DC
Start: 2022-01-09 — End: 2022-01-12
  Administered 2022-01-09 – 2022-01-11 (×3): 100 mg via ORAL
  Filled 2022-01-09 (×3): qty 1

## 2022-01-09 MED ORDER — ACETAMINOPHEN 650 MG RE SUPP: 650.0000 mg | Freq: Four times a day (QID) | RECTAL | Status: DC | PRN

## 2022-01-09 MED ORDER — LORATADINE 10 MG PO TABS
10.0000 mg | ORAL_TABLET | Freq: Every evening | ORAL | Status: DC
  Administered 2022-01-09 – 2022-01-11 (×3): 10 mg via ORAL
  Filled 2022-01-09 (×3): qty 1

## 2022-01-09 NOTE — ED Provider Notes (Signed)
[facility] DEPT Provider Note   CSN: [id number] Arrival date & time: 01/08/22  2337     History  Chief Complaint  Patient presents with   Shortness of Breath    Michele Craig is a 51 y.o. female.  Michele B Stitely is a 51 y.o. female with a history of asthma, hypertension, fibromyalgia, GERD, IBS, who presents to the emergency department for evaluation of shortness of breath, cough and wheezing.  Symptoms have been ongoing for the past 3 days.  Patient reports this feels like her typical asthma exacerbation.  She reports that she initially tried treating the symptoms with her inhaler, was not getting improvement so started doing nebulizer treatments every 6 hours, continued to feel worsening shortness of breath so for the past day has been doing neb treatments every 4 hours but continued to feel increasing shortness of breath with worsening wheezing and cough.  She denies any associated chest pain or chest tightness.  No fevers or chills.  Has had some rhinorrhea and postnasal drip.  No known sick contacts.  No other aggravating or alleviating factors.  The history is provided by the patient and a friend.      Home Medications Prior to Admission medications   Medication Sig Start Date End Date Taking? Authorizing Provider  ADVAIR DISKUS 250-50 MCG/DOSE AEPB Inhale 1 puff into the lungs 2 (two) times daily.  05/17/14  Yes [provider]  albuterol (PROVENTIL HFA;VENTOLIN HFA) 108 (90 BASE) MCG/ACT inhaler Inhale 2 puffs into the lungs every 6 (six) hours as needed for wheezing or shortness of breath.   Yes [provider]  ARIPiprazole (ABILIFY) 10 MG tablet Take 10 mg by mouth every morning. 04/18/21  Yes [provider]  buPROPion (WELLBUTRIN XL) 150 MG 24 hr tablet Take 300 mg by mouth daily. 05/17/18  Yes [provider]  clonazePAM (KLONOPIN) 2 MG tablet Take 2 mg by mouth at bedtime. 03/03/21  Yes [provider]   DULoxetine (CYMBALTA) 30 MG capsule Take 30 mg by mouth at bedtime. 07/05/21  Yes [provider]  DULoxetine (CYMBALTA) 60 MG capsule Take 60 mg by mouth at bedtime.   Yes [provider]  fluticasone (FLONASE) 50 MCG/ACT nasal spray Place 1 spray into both nostrils daily as needed for allergies. 04/17/21  Yes [provider]  fluticasone (FLOVENT HFA) 44 MCG/ACT inhaler Inhale 2 puffs into the lungs daily as needed (for shortness of breath).   Yes [provider]  gabapentin (NEURONTIN) 100 MG capsule 400 mg 2 (two) times daily. 07/07/21  Yes [provider]  hydroxychloroquine (PLAQUENIL) 200 MG tablet Take 200 mg by mouth 2 (two) times daily. 04/27/21  Yes [provider]  ipratropium-albuterol (DUONEB) 0.5-2.5 (3) MG/3ML SOLN Inhale 3 mLs into the lungs 4 (four) times daily as needed (for shortness of breath and wheezing). 12/01/21  Yes [provider]  levocetirizine (XYZAL) 5 MG tablet SMARTSIG:1 Tablet(s) By Mouth Every Evening 04/30/21  Yes [provider]  levonorgestrel (MIRENA, 52 MG,) 20 MCG/DAY IUD Mirena 20 mcg/24 hours (7 yrs) 52 mg intrauterine device  Take 1 device by intrauterine route.   Yes [provider]  omeprazole (PRILOSEC) 20 MG capsule Take 20 mg by mouth every evening. 07/05/21  Yes [provider]  SUMAtriptan (IMITREX) 100 MG tablet Take 100 mg by mouth every 2 (two) hours as needed for migraine. 11/28/21  Yes [provider]  SYMBICORT 160-4.5 MCG/ACT inhaler Inhale 2  puffs into the lungs in the morning and at bedtime. 12/25/21  Yes [provider]  traZODone (DESYREL) 100 MG tablet Take 100 mg by mouth at bedtime. 04/18/21  Yes [provider]  CIMZIA PREFILLED 2 X 200 MG/ML PSKT Inject into the skin. 04/22/21   [provider]  phentermine (ADIPEX-P) 37.5 MG tablet Take 37.5 mg by mouth every morning. Patient not taking: Reported on 01/09/2022 06/25/21    [provider]  topiramate (TOPAMAX) 50 MG tablet Take 50 mg by mouth daily. Patient not taking: Reported on 01/09/2022 01/29/21   [provider]      Allergies    Shrimp [shellfish allergy], Penicillins, and Zosyn [piperacillin sod-tazobactam so]    Review of Systems   Review of Systems  Constitutional:  Negative for chills and fever.  HENT:  Positive for congestion and rhinorrhea.   Respiratory:  Positive for cough, shortness of breath and wheezing. Negative for chest tightness.   Cardiovascular:  Negative for chest pain and leg swelling.  Gastrointestinal:  Negative for abdominal pain, nausea and vomiting.  Genitourinary:  Negative for dysuria and frequency.  Musculoskeletal:  Negative for arthralgias and myalgias.  Skin:  Negative for color change and rash.  Neurological:  Negative for dizziness, syncope and light-headedness.  All other systems reviewed and are negative.  Physical Exam Updated Vital Signs BP (!) 159/134 (BP Location: Left Arm)    Pulse (!) 113    Temp 98.1 F (36.7 C)    Resp 19    Ht 5\' 7"  (1.702 m)    Wt (!) 147.4 kg    SpO2 95%    BMI 50.90 kg/m  Physical Exam Vitals and nursing note reviewed.  Constitutional:      General: She is not in acute distress.    Appearance: Normal appearance. She is well-developed. She is obese. She is not diaphoretic.  HENT:     Head: Normocephalic and atraumatic.  Eyes:     General:        Right eye: No discharge.        Left eye: No discharge.     Pupils: Pupils are equal, round, and reactive to light.  Cardiovascular:     Rate and Rhythm: Normal rate and regular rhythm.     Pulses: Normal pulses.     Heart sounds: Normal heart sounds.  Pulmonary:     Effort: Pulmonary effort is normal. No respiratory distress.     Breath sounds: Decreased breath sounds and wheezing present. No rales.     Comments: Patient tachypneic but able to speak in full sentences, satting well on room air, on auscultation she  has decreased breath sounds with diffuse wheezing on forced expiration, intermittent cough on exam. Chest:     Chest wall: No tenderness.  Abdominal:     General: Bowel sounds are normal. There is no distension.     Palpations: Abdomen is soft. There is no mass.     Tenderness: There is no abdominal tenderness. There is no guarding.     Comments: Abdomen soft, nondistended, nontender to palpation in all quadrants without guarding or peritoneal signs  Musculoskeletal:        General: No deformity.     Cervical back: Neck supple.     Right lower leg: No tenderness. No edema.     Left lower leg: No tenderness. No edema.  Skin:    General: Skin is warm and dry.     Capillary Refill: Capillary refill  takes less than 2 seconds.  Neurological:     Mental Status: She is alert and oriented to person, place, and time.     Coordination: Coordination normal.     Comments: Speech is clear, able to follow commands Moves extremities without ataxia, coordination intact  Psychiatric:        Mood and Affect: Mood normal.        Behavior: Behavior normal.    ED Results / Procedures / Treatments   Labs (all labs ordered are listed, but only abnormal results are displayed) Labs Reviewed  BASIC METABOLIC PANEL - Abnormal; Notable for the following components:      Result Value   CO2 21 (*)    Glucose, Bld 129 (*)    Calcium 8.4 (*)    All other components within normal limits  RESP PANEL BY RT-PCR (FLU A&B, COVID) ARPGX2  CBC WITH DIFFERENTIAL/PLATELET    EKG EKG Interpretation  Date/Time:  Thursday January 09 2022 00:02:07 EST Ventricular Rate:  106 PR Interval:  154 QRS Duration: 143 QT Interval:  380 QTC Calculation: 505 R Axis:   69 Text Interpretation: Sinus tachycardia Left bundle branch block Baseline wander in lead(s) I V2 Confirmed by Quintella Reichert [phone]) on 01/09/2022 1:52:53 AM  Radiology DG Chest Port 1 View  Result Date: 01/09/2022 CLINICAL DATA:  Shortness of breath.  EXAM: PORTABLE CHEST 1 VIEW COMPARISON:  Chest radiograph dated 07/04/2018. FINDINGS: The heart size and mediastinal contours are within normal limits. Both lungs are clear. The visualized skeletal structures are unremarkable. IMPRESSION: No active disease. Electronically Signed   By: Anner Crete M.D.   On: 01/09/2022 01:01    Procedures Procedures    Medications Ordered in ED Medications  methylPREDNISolone sodium succinate (SOLU-MEDROL) 125 mg/2 mL injection 125 mg (125 mg Intravenous Given 01/09/22 0120)  magnesium sulfate IVPB 2 g 50 mL (0 g Intravenous Stopped 01/09/22 0220)  albuterol (PROVENTIL) (2.5 MG/3ML) 0.083% nebulizer solution (10 mg/hr Nebulization Given 01/09/22 0053)  ipratropium-albuterol (DUONEB) 0.5-2.5 (3) MG/3ML nebulizer solution 3 mL (3 mLs Nebulization Given 01/09/22 [id number])    ED Course/ Medical Decision Making/ A&P                           This patient presents to the ED for concern of shortness of breath, wheezing, cough, this involves an extensive number of treatment options, and is a complaint that carries with it a high risk of complications and morbidity.  The differential diagnosis includes asthma exacerbation, bronchitis, viral upper respiratory infection, flu, COVID, PE, CHF   Co morbidities that complicate the patient evaluation  Asthma, obesity   Additional history obtained:  Additional history obtained from friend at bedside External records from outside source obtained and reviewed including prior ED visits   Lab Tests:  I Ordered, and personally interpreted labs.  The pertinent results include: No leukocytosis, glucose 129 but no other significant electrolyte derangements, normal renal function, negative COVID and flu   Imaging Studies ordered:  I ordered imaging studies including chest x-ray I independently visualized and interpreted imaging which showed no pneumonia I agree with the radiologist interpretation   Cardiac  Monitoring:  The patient was maintained on a cardiac monitor.  I personally viewed and interpreted the cardiac monitored which showed an underlying rhythm of: Sinus tachycardia   Medicines ordered and prescription drug management:  I ordered medication including continuous nebulizer treatment, Solu-Medrol, magnesium for asthma exacerbation Reevaluation of the  patient after these medicines showed that the patient improved I have reviewed the patients home medicines and have made adjustments as needed   Critical Interventions:  Breathing treatments for asthma exacerbation   Consultations Obtained:  I requested consultation with the hospitalist for admission,  and discussed lab and imaging findings as well as pertinent plan -Dr. Marlowe Sax will see and admit the patient.  Problem List / ED Course:  Patient presents with 3 days of shortness of breath, wheezing and cough, symptoms worsening despite using at home medications and breathing treatments. Patient treated with hour-long continuous treatment, steroids and mag with some improvement but still with wheezing and shortness of breath.  Lab work and chest x-ray are reassuring. Will require admission for continued treatment of asthma exacerbation         Final Clinical Impression(s) / ED Diagnoses Final diagnoses:  Exacerbation of asthma, unspecified asthma severity, unspecified whether persistent    Rx / DC Orders ED Discharge Orders     None         Janet Berlin 01/09/22 0736    Quintella Reichert, MD 01/10/22 [phone]

## 2022-01-09 NOTE — Progress Notes (Signed)
?   01/09/22 1214  ?Assess: MEWS Score  ?Temp 99.1 ?F (37.3 ?C)  ?BP (!) 135/99  ?Pulse Rate (!) 116  ?Resp 18  ?SpO2 96 %  ?O2 Device Room Air  ?Assess: MEWS Score  ?MEWS Temp 0  ?MEWS Systolic 0  ?MEWS Pulse 2  ?MEWS RR 0  ?MEWS LOC 0  ?MEWS Score 2  ?MEWS Score Color Yellow  ?Assess: if the MEWS score is Yellow or Red  ?Were vital signs taken at a resting state? Yes  ?Focused Assessment No change from prior assessment  ?Does the patient meet 2 or more of the SIRS criteria? No  ?MEWS guidelines implemented *See Row Information* No, previously yellow, continue vital signs every 4 hours  ?Assess: SIRS CRITERIA  ?SIRS Temperature  0  ?SIRS Pulse 1  ?SIRS Respirations  0  ?SIRS WBC 0  ?SIRS Score Sum  1  ? ?New admission, HR has been Sinus Tach since ED admission. No change from prior assessment. Will continue Q4 vital signs. Will continue to monitor. ? ?Sinclair Ship, RN ?

## 2022-01-09 NOTE — H&P (Signed)
History and Physical    Patient: Michele Craig DOB: 30-Sep-1971 DOA: 01/08/2022 DOS: the patient was seen and examined on 01/09/2022 PCP: Marva Panda, NP  Patient coming from: Home  Chief Complaint:  Chief Complaint  Patient presents with   Shortness of Breath    HPI: Michele Craig is a 51 y.o. female with medical history significant of asthma, fibromyalgia, RA, anxiety, GERD. Presentign with shortness of breath. Symptoms started 3 days ago. Initially seemed like allergies w/ a bunch of sneezing. However, this quickly transitioned to coughs and wheeze. She tried taking her regular medication and rescue inhalers/nebs, but they didn't work. She got to the point were she was having a treatment of some sort every 4 hours. When her symptoms didn't improve last night, she decided to come to the ED for help. She denies any other aggravating or alleviating factors.    Review of Systems: As mentioned in the history of present illness. All other systems reviewed and are negative. Past Medical History:  Diagnosis Date   Anemia    Anxiety    Arthritis    Asthma    Complication of anesthesia    with carpal tunnel syndrome the block didn't work well, she could feel things during the surgery   Depression    Diabetes mellitus    pt states after her gastric sleeve procedure she is no longer on medications and is not checked for it   Family history of adverse reaction to anesthesia    unknown, pt is adopted   Fibromyalgia    Foot pain    GERD (gastroesophageal reflux disease)    Headache    Hypertension    IBS (irritable bowel syndrome)    after cholecystectomy and then it went away after Gastric sleeve   Pneumonia    RA (rheumatoid arthritis) (HCC)    Shortness of breath dyspnea    only when her asthma is flaring up   Sleep apnea    does not use cpap   Past Surgical History:  Procedure Laterality Date   CARPAL TUNNEL RELEASE     CARPAL TUNNEL RELEASE Right 04/04/2016    Procedure: RIGHT CARPAL TUNNEL RELEASE;  Surgeon: Frederico Hamman, MD;  Location: [facility] OR;  Service: Orthopedics;  Laterality: Right;  Synovectomy    CESAREAN SECTION     x 2   CHOLECYSTECTOMY     deuodenal switch     DILATION AND CURETTAGE OF UTERUS     LAPAROSCOPIC GASTRIC SLEEVE RESECTION     TONSILLECTOMY     Social History:  reports that she has never smoked. She has never used smokeless tobacco. She reports that she does not currently use alcohol. She reports that she does not use drugs.  Allergies  Allergen Reactions   Shrimp [Shellfish Allergy] Anaphylaxis    Crawdads    Penicillins Hives    Has patient had a PCN reaction causing immediate rash, facial/tongue/throat swelling, SOB or lightheadedness with hypotension: No Has patient had a PCN reaction causing severe rash involving mucus membranes or skin necrosis: No Has patient had a PCN reaction that required hospitalization No Has patient had a PCN reaction occurring within the last 10 years: No If all of the above answers are "NO", then may proceed with Cephalosporin use.   Zosyn [Piperacillin Sod-Tazobactam So] Hives and Itching    Family History  Adopted: Yes  Family history unknown: Yes    Prior to Admission medications   Medication Sig Start Date End  Date Taking? Authorizing Provider  ADVAIR DISKUS 250-50 MCG/DOSE AEPB Inhale 1 puff into the lungs 2 (two) times daily.  05/17/14  Yes [provider]  albuterol (PROVENTIL HFA;VENTOLIN HFA) 108 (90 BASE) MCG/ACT inhaler Inhale 2 puffs into the lungs every 6 (six) hours as needed for wheezing or shortness of breath.   Yes [provider]  ARIPiprazole (ABILIFY) 10 MG tablet Take 10 mg by mouth every morning. 04/18/21  Yes [provider]  buPROPion (WELLBUTRIN XL) 150 MG 24 hr tablet Take 300 mg by mouth daily. 05/17/18  Yes [provider]  clonazePAM (KLONOPIN) 2 MG tablet Take 2 mg by mouth at bedtime. 03/03/21  Yes [provider]  DULoxetine (CYMBALTA) 30 MG capsule Take 30 mg by mouth at bedtime. 07/05/21  Yes [provider]  DULoxetine (CYMBALTA) 60 MG capsule Take 60 mg by mouth at bedtime.   Yes [provider]  fluticasone (FLONASE) 50 MCG/ACT nasal spray Place 1 spray into both nostrils daily as needed for allergies. 04/17/21  Yes [provider]  fluticasone (FLOVENT HFA) 44 MCG/ACT inhaler Inhale 2 puffs into the lungs daily as needed (for shortness of breath).   Yes [provider]  gabapentin (NEURONTIN) 100 MG capsule 400 mg 2 (two) times daily. 07/07/21  Yes [provider]  hydroxychloroquine (PLAQUENIL) 200 MG tablet Take 200 mg by mouth 2 (two) times daily. 04/27/21  Yes [provider]  ipratropium-albuterol (DUONEB) 0.5-2.5 (3) MG/3ML SOLN Inhale 3 mLs into the lungs 4 (four) times daily as needed (for shortness of breath and wheezing). 12/01/21  Yes [provider]  levocetirizine (XYZAL) 5 MG tablet SMARTSIG:1 Tablet(s) By Mouth Every Evening 04/30/21  Yes [provider]  levonorgestrel (MIRENA, 52 MG,) 20 MCG/DAY IUD Mirena 20 mcg/24 hours (7 yrs) 52 mg intrauterine device  Take 1 device by intrauterine route.   Yes [provider]  omeprazole (PRILOSEC) 20 MG capsule Take 20 mg by mouth every evening. 07/05/21  Yes [provider]  SUMAtriptan (IMITREX) 100 MG tablet Take 100 mg by mouth every 2 (two) hours as needed for migraine. 11/28/21  Yes [provider]  SYMBICORT 160-4.5 MCG/ACT inhaler Inhale 2 puffs into the lungs in the morning and at bedtime. 12/25/21  Yes [provider]  traZODone (DESYREL) 100 MG tablet Take 100 mg by mouth at bedtime. 04/18/21  Yes [provider]  CIMZIA PREFILLED 2 X 200 MG/ML PSKT Inject into the skin. 04/22/21   [provider]  phentermine (ADIPEX-P) 37.5 MG tablet Take 37.5 mg by mouth every morning. Patient not taking: Reported on 01/09/2022 06/25/21    [provider]  topiramate (TOPAMAX) 50 MG tablet Take 50 mg by mouth daily. Patient not taking: Reported on 01/09/2022 01/29/21   [provider]    Physical Exam: Vitals:   01/09/22 0430 01/09/22 0530 01/09/22 0600 01/09/22 0630  BP: (!) 141/99 (!) 128/96 123/69 (!) 141/108  Pulse: (!) 115 (!) 114 (!) 109 (!) 108  Resp: (!) 26 (!) 21 17 13   Temp:      SpO2: 98% 98% 97% 96%  Weight:      Height:       General: 51 y.o. female resting in bed in NAD Eyes: PERRL, normal sclera ENMT: Nares patent w/o discharge, orophaynx clear, dentition normal, ears w/o discharge/lesions/ulcers Neck: Supple, trachea midline Cardiovascular: RRR, +S1, S2, no m/g/r, equal pulses throughout Respiratory: decreased at bases, no w/r/r, slightly increased WOB on RA  GI: BS+, NDNT, no masses noted, no organomegaly noted. obese MSK: No e/c/c Neuro: A&O x 3, no focal deficits Psyc: Appropriate interaction and affect, calm/cooperative   Data Reviewed:  Glucose 129 WBC 6.8 CXR: No active disease.   Assessment and Plan: No notes have been filed under this hospital service. Service: Hospitalist Asthma exacerbation     - placed in obs, tele     - continue steroids, nebs, singular, loratadine     - PRN guaifenesin     - O2 supplement as needed  Anxiety     - resume home regimen  DM2     - diet-controlled     - A1c, DM diet, glucose checks, SSI  Migraines     - continue home regimen  Morbid obesity     - counsel on diet lifestyle changes; follow up outpt  Rheumatoid arthritis Fibromyalgia     - continue home regimen  GERD     - PPI  Advance Care Planning:   Code Status: FULL  Consults: None  Family Communication: w/ family at bedside  Severity of Illness: The appropriate patient status for this patient is OBSERVATION. Observation status is judged to be reasonable and necessary in order to provide the required intensity of service to ensure the patient's safety. The  patient's presenting symptoms, physical exam findings, and initial radiographic and laboratory data in the context of their medical condition is felt to place them at decreased risk for further clinical deterioration. Furthermore, it is anticipated that the patient will be medically stable for discharge from the hospital within 2 midnights of admission.   Author: Teddy Spike, DO 01/09/2022 7:22 AM  For on call review [email or web address].

## 2022-01-10 DIAGNOSIS — J45901 Unspecified asthma with (acute) exacerbation: Secondary | ICD-10-CM | POA: Diagnosis not present

## 2022-01-10 DIAGNOSIS — E1159 Type 2 diabetes mellitus with other circulatory complications: Secondary | ICD-10-CM | POA: Diagnosis not present

## 2022-01-10 DIAGNOSIS — I011 Acute rheumatic endocarditis: Secondary | ICD-10-CM

## 2022-01-10 LAB — COMPREHENSIVE METABOLIC PANEL
ALT: 23 U/L (ref 0–44)
AST: 21 U/L (ref 15–41)
Albumin: 3.3 g/dL — ABNORMAL LOW (ref 3.5–5.0)
Alkaline Phosphatase: 82 U/L (ref 38–126)
Anion gap: 7 (ref 5–15)
BUN: 11 mg/dL (ref 6–20)
CO2: 22 mmol/L (ref 22–32)
Calcium: 8.6 mg/dL — ABNORMAL LOW (ref 8.9–10.3)
Chloride: 108 mmol/L (ref 98–111)
Creatinine, Ser: 0.9 mg/dL (ref 0.44–1.00)
GFR, Estimated: >60 mL/min (ref >60)
Glucose, Bld: 153 mg/dL — ABNORMAL HIGH (ref 70–99)
Potassium: 3.5 mmol/L (ref 3.5–5.1)
Sodium: 137 mmol/L (ref 135–145)
Total Bilirubin: 0.3 mg/dL (ref 0.3–1.2)
Total Protein: 6.2 g/dL — ABNORMAL LOW (ref 6.5–8.1)

## 2022-01-10 LAB — CBC
HCT: 43.2 % (ref 36.0–46.0)
Hemoglobin: 14.5 g/dL (ref 12.0–15.0)
MCH: 30.1 pg (ref 26.0–34.0)
MCHC: 33.6 g/dL (ref 30.0–36.0)
MCV: 89.6 fL (ref 80.0–100.0)
Platelets: 294 10*3/uL (ref 150–400)
RBC: 4.82 MIL/uL (ref 3.87–5.11)
RDW: 13.2 % (ref 11.5–15.5)
WBC: 20.4 10*3/uL — ABNORMAL HIGH (ref 4.0–10.5)
nRBC: 0 % (ref 0.0–0.2)

## 2022-01-10 LAB — GLUCOSE, CAPILLARY
Glucose-Capillary: 134 mg/dL — ABNORMAL HIGH (ref 70–99)
Glucose-Capillary: 147 mg/dL — ABNORMAL HIGH (ref 70–99)
Glucose-Capillary: 97 mg/dL (ref 70–99)
Glucose-Capillary: 121 mg/dL — ABNORMAL HIGH (ref 70–99)

## 2022-01-10 LAB — HIV ANTIBODY (ROUTINE TESTING W REFLEX): HIV Screen 4th Generation wRfx: NONREACTIVE

## 2022-01-10 NOTE — Progress Notes (Signed)
[facility] ? ?PROGRESS NOTE ? ?[place] B Will [id number] DOB: 1971/09/19 DOA: 01/08/2022 ?PCP: Marva Panda, NP ? ? ?Brief HPI:   ?51 year old female with a history of asthma, fibromyalgia, RA, anxiety, GERD presented with shortness of breath.  Admitted with asthma exacerbation ? ? ? ?Subjective  ? ?This morning she feels better, though not back to baseline.  Became tachycardic on ambulation. ? ? Assessment/Plan:  ? ? ?Asthma exacerbation ?-Improved with steroids and DuoNeb nebulizers ?-Continue prednisone 40 mg daily ?-Discontinue scheduled albuterol and continue as needed DuoNebs every 6 hours ? ?Anxiety ?-Continue Abilify, clonazepam, bupropion ? ?Fibromyalgia/rheumatoid arthritis ?-Continue hydroxychloroquine, gabapentin ? ?GERD ?-Continue Protonix ? ?Diabetes mellitus type 2 ?-CBG well controlled ?-Continue sliding scale insulin with NovoLog ? ? ?Medications ? ?  ? ARIPiprazole  10 mg Oral q morning  ? buPROPion  300 mg Oral Daily  ? clonazePAM  2 mg Oral QHS  ? DULoxetine  90 mg Oral QHS  ? enoxaparin (LOVENOX) injection  70 mg Subcutaneous Q24H  ? gabapentin  400 mg Oral BID  ? hydroxychloroquine  200 mg Oral BID  ? insulin aspart  0-15 Units Subcutaneous TID WC  ? loratadine  10 mg Oral QPM  ? montelukast  10 mg Oral QHS  ? pantoprazole  40 mg Oral Daily  ? predniSONE  40 mg Oral Q breakfast  ? traZODone  100 mg Oral QHS  ? ? ? Data Reviewed:  ? ?CBG: ? ?Recent Labs  ?Lab 01/09/22 ?1747 01/09/22 ?2115 01/10/22 ?0350 01/10/22 ?1228 01/10/22 ?1713  ?GLUCAP 122* 193* 134* 147* 97  ? ? ?SpO2: 93 %  ? ? ?Vitals:  ? 01/10/22 0901 01/10/22 1233 01/10/22 1625 01/10/22 1829  ?BP:  120/73    ?Pulse:  97    ?Resp:  19    ?Temp:  98.1 ?F (36.7 ?C)    ?TempSrc:  Oral    ?SpO2: 93% 94% 93%   ?Weight:    (!) 149.3 kg  ?Height:      ? ? ? ? ?Data Reviewed: ? ?Basic Metabolic Panel: ?Recent Labs  ?Lab 01/09/22 ?0100 01/10/22 ?0426  ?NA 135 137  ?K 3.8 3.5  ?CL 109 108  ?CO2 21* 22  ?GLUCOSE 129* 153*  ?BUN 12  11  ?CREATININE 0.83 0.90  ?CALCIUM 8.4* 8.6*  ? ? ?CBC: ?Recent Labs  ?Lab 01/09/22 ?0100 01/10/22 ?0426  ?WBC 6.8 20.4*  ?NEUTROABS 5.6  --   ?HGB 14.7 14.5  ?HCT 44.5 43.2  ?MCV 89.7 89.6  ?PLT 289 294  ? ? ?LFT ?Recent Labs  ?Lab 01/10/22 ?0426  ?AST 21  ?ALT 23  ?ALKPHOS 82  ?BILITOT 0.3  ?PROT 6.2*  ?ALBUMIN 3.3*  ? ?  ?Antibiotics: ?Anti-infectives (From admission, onward)  ? ? Start     Dose/Rate Route Frequency Ordered Stop  ? 01/09/22 1200  hydroxychloroquine (PLAQUENIL) tablet 200 mg       ? 200 mg Oral 2 times daily 01/09/22 0943    ? ?  ? ? ? ?DVT prophylaxis: Lovenox ? ?Code Status: Full code ? ?Family Communication: No family at bedside ? ? ?CONSULTS  ? ? ?Objective  ? ? ?Physical Examination: ? ? ?General-appears in no acute distress ?Heart-S1-S2, regular, no murmur auscultated ?Lungs-decreased breath sounds at lung bases ?Abdomen-soft, nontender, no organomegaly ?Extremities-no edema in the lower extremities ?Neuro-alert, oriented x3, no focal deficit noted ? ?Status is: Inpatient: Asthma exacerbation ? ? ? ?  ? ? ? ? ? ? ? ?  Meredeth Ide ?  ?[facility] ?If 7PM-7AM, please contact night-coverage at [email or web address], ?Office  [phone] ? ? ?01/10/2022, 6:38 PM  LOS: 0 days  ? ? ? ? ? ? ? ? ? ? ?  ?

## 2022-01-10 NOTE — Plan of Care (Signed)
  Problem: Education: Goal: Knowledge of General Education information will improve Description: Including pain rating scale, medication(s)/side effects and non-pharmacologic comfort measures Outcome: Progressing   Problem: Health Behavior/Discharge Planning: Goal: Ability to manage health-related needs will improve Outcome: Progressing   Problem: Clinical Measurements: Goal: Respiratory complications will improve Outcome: Progressing   

## 2022-01-11 DIAGNOSIS — J45901 Unspecified asthma with (acute) exacerbation: Secondary | ICD-10-CM | POA: Diagnosis not present

## 2022-01-11 DIAGNOSIS — I011 Acute rheumatic endocarditis: Secondary | ICD-10-CM | POA: Diagnosis not present

## 2022-01-11 DIAGNOSIS — E1159 Type 2 diabetes mellitus with other circulatory complications: Secondary | ICD-10-CM | POA: Diagnosis not present

## 2022-01-11 LAB — GLUCOSE, CAPILLARY
Glucose-Capillary: 68 mg/dL — ABNORMAL LOW (ref 70–99)
Glucose-Capillary: 99 mg/dL (ref 70–99)
Glucose-Capillary: 132 mg/dL — ABNORMAL HIGH (ref 70–99)

## 2022-01-11 MED ORDER — METOPROLOL TARTRATE 25 MG PO TABS
12.5000 mg | ORAL_TABLET | Freq: Two times a day (BID) | ORAL | Status: DC
Start: 2022-01-11 — End: 2022-01-11

## 2022-01-11 MED ORDER — INSULIN ASPART 100 UNIT/ML IJ SOLN: 0.0000 [IU] | Freq: Three times a day (TID) | INTRAMUSCULAR | Status: DC

## 2022-01-11 MED ORDER — LEVALBUTEROL HCL 0.63 MG/3ML IN NEBU
0.6300 mg | INHALATION_SOLUTION | Freq: Four times a day (QID) | RESPIRATORY_TRACT | Status: DC | PRN
  Administered 2022-01-11 – 2022-01-12 (×3): 0.63 mg via RESPIRATORY_TRACT
  Filled 2022-01-11 (×3): qty 3

## 2022-01-11 NOTE — Progress Notes (Signed)
Pt c/o chest discomfort while ambulating. HR 130-160 during ambulation. MD on unit and aware. Obtained EKG and presented to MD. BP 120/73. Pt back in bed without c/p. Instructed to call if CP present again. Pt verbalized understanding. Cont with plan of care ?

## 2022-01-11 NOTE — Progress Notes (Signed)
SATURATION QUALIFICATIONS: (This note is used to comply with regulatory documentation for home oxygen) ? ?Patient Saturations on Room Air at Rest = 96% ? ?Patient Saturations on Room Air while Ambulating = 95% ? ?Patient Saturations on RA Liters of oxygen while Ambulating = 96% ? ?Please briefly explain why patient needs home oxygen: No need for home oxygen at this time ? ?

## 2022-01-11 NOTE — Progress Notes (Signed)
[facility] ? ?PROGRESS NOTE ? ?Michele Craig EYC:[id number] DOB: 1971-10-29 DOA: 01/08/2022 ?PCP: Marva Panda, NP ? ? ?Brief HPI:   ?51 year old female with a history of asthma, fibromyalgia, RA, anxiety, GERD presented with shortness of breath.  Admitted with asthma exacerbation ? ? ? ?Subjective  ? ?Patient seen and examined, breathing is improved, still getting tachycardic on ambulation.  Heart rate went up to 140s on ambulation.  Does not require oxygen, pulse ox remained 95% on ambulation. ? ? Assessment/Plan:  ? ? ?Asthma exacerbation ?-Improved with steroids and DuoNeb nebulizers ?-Continue prednisone 40 mg daily ?-Discontinue as needed DuoNebs ?-Start Xopenex nebulizer every 6 hours as needed ? ?Anxiety ?-Continue Abilify, clonazepam, bupropion ? ?Fibromyalgia/rheumatoid arthritis ?-Continue hydroxychloroquine, gabapentin ? ?GERD ?-Continue Protonix ? ?Diabetes mellitus type 2 ?-CBG well controlled ?-Continue sliding scale insulin with NovoLog ? ?Sinus tachycardia ?-Likely due to albuterol ?-Patient is on high-dose 300 mg bupropion which can cause sinus tachycardia ?-I will hold bupropion, albuterol changed to Xopenex ?-We will avoid giving beta-blockers as patient blood pressure is soft. ?-EKG shows normal sinus rhythm ?-We will continue to monitor on telemetry ? ?Leukocytosis ?-WBC 20,000, likely from steroids ?-No signs and symptoms of infection. ? ? ?Medications ? ?  ? ARIPiprazole  10 mg Oral q morning  ? clonazePAM  2 mg Oral QHS  ? DULoxetine  90 mg Oral QHS  ? enoxaparin (LOVENOX) injection  70 mg Subcutaneous Q24H  ? gabapentin  400 mg Oral BID  ? hydroxychloroquine  200 mg Oral BID  ? insulin aspart  0-15 Units Subcutaneous TID WC  ? loratadine  10 mg Oral QPM  ? montelukast  10 mg Oral QHS  ? pantoprazole  40 mg Oral Daily  ? predniSONE  40 mg Oral Q breakfast  ? traZODone  100 mg Oral QHS  ? ? ? Data Reviewed:  ? ?CBG: ? ?Recent Labs  ?Lab 01/10/22 ?1228 01/10/22 ?1713 01/10/22 ?2057  01/11/22 ?0726 01/11/22 ?1201  ?GLUCAP 147* 97 121* 68* 99  ? ? ?SpO2: 95 %  ? ? ?Vitals:  ? 01/11/22 1130 01/11/22 1132 01/11/22 1139 01/11/22 1312  ?BP:   120/73 109/66  ?Pulse: (!) 147 (!) 149 (!) 110 (!) 101  ?Resp:   18 14  ?Temp:   98.3 ?F (36.8 ?C) 98.4 ?F (36.9 ?C)  ?TempSrc:   Oral Oral  ?SpO2: 96% 94% 96% 95%  ?Weight:      ?Height:      ? ? ? ? ?Data Reviewed: ? ?Basic Metabolic Panel: ?Recent Labs  ?Lab 01/09/22 ?0100 01/10/22 ?0426  ?NA 135 137  ?K 3.8 3.5  ?CL 109 108  ?CO2 21* 22  ?GLUCOSE 129* 153*  ?BUN 12 11  ?CREATININE 0.83 0.90  ?CALCIUM 8.4* 8.6*  ? ? ?CBC: ?Recent Labs  ?Lab 01/09/22 ?0100 01/10/22 ?0426  ?WBC 6.8 20.4*  ?NEUTROABS 5.6  --   ?HGB 14.7 14.5  ?HCT 44.5 43.2  ?MCV 89.7 89.6  ?PLT 289 294  ? ? ?LFT ?Recent Labs  ?Lab 01/10/22 ?0426  ?AST 21  ?ALT 23  ?ALKPHOS 82  ?BILITOT 0.3  ?PROT 6.2*  ?ALBUMIN 3.3*  ? ?  ?Antibiotics: ?Anti-infectives (From admission, onward)  ? ? Start     Dose/Rate Route Frequency Ordered Stop  ? 01/09/22 1200  hydroxychloroquine (PLAQUENIL) tablet 200 mg       ? 200 mg Oral 2 times daily 01/09/22 1497    ? ?  ? ? ? ?DVT prophylaxis:  Lovenox ? ?Code Status: Full code ? ?Family Communication: No family at bedside ? ? ?CONSULTS  ? ? ?Objective  ? ? ?Physical Examination: ? ? ?General-appears in no acute distress ?Heart-S1-S2, regular, no murmur auscultated ?Lungs-clear to auscultation bilaterally, no wheezing or crackles auscultated ?Abdomen-soft, nontender, no organomegaly ?Extremities-no edema in the lower extremities ?Neuro-alert, oriented x3, no focal deficit noted ? ?Status is: Inpatient: Asthma exacerbation ? ? ? ?  ? ? ? ? ?Meredeth Ide ?  ?[facility] ?If 7PM-7AM, please contact night-coverage at [email or web address], ?Office  [phone] ? ? ?01/11/2022, 3:45 PM  LOS: 0 days  ? ? ? ? ? ? ? ? ? ? ?  ?

## 2022-01-12 DIAGNOSIS — I011 Acute rheumatic endocarditis: Secondary | ICD-10-CM | POA: Diagnosis not present

## 2022-01-12 DIAGNOSIS — E1159 Type 2 diabetes mellitus with other circulatory complications: Secondary | ICD-10-CM | POA: Diagnosis not present

## 2022-01-12 DIAGNOSIS — J45901 Unspecified asthma with (acute) exacerbation: Secondary | ICD-10-CM | POA: Diagnosis not present

## 2022-01-12 LAB — GLUCOSE, CAPILLARY
Glucose-Capillary: 77 mg/dL (ref 70–99)
Glucose-Capillary: 91 mg/dL (ref 70–99)

## 2022-01-12 MED ORDER — LEVALBUTEROL HCL 0.63 MG/3ML IN NEBU: 0.6300 mg | INHALATION_SOLUTION | Freq: Four times a day (QID) | RESPIRATORY_TRACT | 5 refills | Status: DC | PRN

## 2022-01-12 MED ORDER — PREDNISONE 10 MG PO TABS: ORAL_TABLET | ORAL | 0 refills | Status: DC

## 2022-01-12 NOTE — Discharge Summary (Addendum)
Physician Discharge Summary   Patient: Michele Craig MRN: [id number] DOB: Mar 15, 1971  Admit date:     01/08/2022  Discharge date: 01/12/22  Discharge Physician: Meredeth Ide   PCP: Marva Panda, NP   Recommendations at discharge:   Follow-up PCP in 1 week  Discharge Diagnoses: Principal Problem:   Acute asthma exacerbation Active Problems:   Morbid obesity (HCC)   DM (diabetes mellitus) (HCC)   GERD (gastroesophageal reflux disease)   Anxiety   Rheumatoid aortitis   Fibromyalgia   Migraines  Resolved Problems:   * No resolved hospital problems. *   Hospital Course: 51 year old female with a history of asthma, fibromyalgia, RA, anxiety, GERD presented with shortness of breath.  Admitted with asthma exacerbation    Assessment and Plan:  Asthma exacerbation -Improved with steroids and DuoNeb nebulizers -We will discharge on prednisone taper -DuoNebs discontinued due to tachycardia -Started on Xopenex nebulizer every 6 hours as needed    Anxiety -Continue Abilify, clonazepam, bupropion   Fibromyalgia/rheumatoid arthritis -Continue hydroxychloroquine, gabapentin   GERD -Continue Protonix   Diabetes mellitus type 2 -CBG well controlled -Continue sliding scale insulin with NovoLog   Sinus tachycardia -Likely due to albuterol, Wellbutrin -Improved after switching from albuterol to Xopenex -On ambulation heart rate went up to 120, checked manually at radial pulse, O2 sats maintained 95% on RA -Patient takes Symbicort and Advair at home -I will discontinue Symbicort and continue with Advair -She is also on Flovent nebulizer -Discontinue DuoNebs and albuterol; start Xopenex nebulizer every 6 hours as needed -Patient also takes phentermine at home which can cause sinus tachycardia -I have asked the patient to check with her PCP and stop medications which can contribute to sinus tachycardia. -EKG was obtained yesterday which again showed sinus  tachycardia -Patient was feeling little unsteady on her feet, will obtain home health PT versus outpatient PT   Leukocytosis -WBC 20,000, likely from steroids -No signs and symptoms of infection.      Pain control - [employer] Controlled Substance Reporting System database was reviewed. and patient was instructed, not to drive, operate heavy machinery, perform activities at heights, swimming or participation in water activities or provide baby-sitting services while on Pain, Sleep and Anxiety Medications; until their outpatient Physician has advised to do so again. Also recommended to not to take more than prescribed Pain, Sleep and Anxiety Medications.   Consultants:  Procedures performed:  Disposition: Home Diet recommendation:  Discharge Diet Orders (From admission, onward)     Start     Ordered   01/12/22 0000  Diet - low sodium heart healthy        01/12/22 1342           Regular diet  DISCHARGE MEDICATION: Allergies as of 01/12/2022       Reactions   Shrimp [shellfish Allergy] Anaphylaxis   Crawdads    Penicillins Hives   Has patient had a PCN reaction causing immediate rash, facial/tongue/throat swelling, SOB or lightheadedness with hypotension: No Has patient had a PCN reaction causing severe rash involving mucus membranes or skin necrosis: No Has patient had a PCN reaction that required hospitalization No Has patient had a PCN reaction occurring within the last 10 years: No If all of the above answers are "NO", then may proceed with Cephalosporin use.   Zosyn [piperacillin Sod-tazobactam So] Hives, Itching        Medication List     STOP taking these medications    albuterol 108 (90 Base) MCG/ACT  inhaler Commonly known as: VENTOLIN HFA   ipratropium-albuterol 0.5-2.5 (3) MG/3ML Soln Commonly known as: DUONEB   Symbicort 160-4.5 MCG/ACT inhaler Generic drug: budesonide-formoterol       TAKE these medications    Advair Diskus 250-50 MCG/ACT  Aepb Generic drug: fluticasone-salmeterol Inhale 1 puff into the lungs 2 (two) times daily.   ARIPiprazole 10 MG tablet Commonly known as: ABILIFY Take 10 mg by mouth every morning.   buPROPion 150 MG 24 hr tablet Commonly known as: WELLBUTRIN XL Take 300 mg by mouth daily.   Cimzia Prefilled 2 X 200 MG/ML Pskt Generic drug: certolizumab pegol Inject into the skin.   clonazePAM 2 MG tablet Commonly known as: KLONOPIN Take 2 mg by mouth at bedtime.   DULoxetine 60 MG capsule Commonly known as: CYMBALTA Take 60 mg by mouth at bedtime.   DULoxetine 30 MG capsule Commonly known as: CYMBALTA Take 30 mg by mouth at bedtime.   fluticasone 44 MCG/ACT inhaler Commonly known as: FLOVENT HFA Inhale 2 puffs into the lungs daily as needed (for shortness of breath).   fluticasone 50 MCG/ACT nasal spray Commonly known as: FLONASE Place 1 spray into both nostrils daily as needed for allergies.   gabapentin 100 MG capsule Commonly known as: NEURONTIN 400 mg 2 (two) times daily.   hydroxychloroquine 200 MG tablet Commonly known as: PLAQUENIL Take 200 mg by mouth 2 (two) times daily.   levalbuterol 0.63 MG/3ML nebulizer solution Commonly known as: XOPENEX Take 3 mLs (0.63 mg total) by nebulization every 6 (six) hours as needed for wheezing or shortness of breath.   levocetirizine 5 MG tablet Commonly known as: XYZAL SMARTSIG:1 Tablet(s) By Mouth Every Evening   Mirena (52 MG) 20 MCG/DAY Iud Generic drug: levonorgestrel Mirena 20 mcg/24 hours (7 yrs) 52 mg intrauterine device  Take 1 device by intrauterine route.   omeprazole 20 MG capsule Commonly known as: PRILOSEC Take 20 mg by mouth every evening.   phentermine 37.5 MG tablet Commonly known as: ADIPEX-P Take 37.5 mg by mouth every morning.   predniSONE 10 MG tablet Commonly known as: DELTASONE Prednisone 30 mg po daily x 1 day then Prednisone 20 mg po daily x 1 day then Prednisone 10 mg daily x 1 day then stop...    SUMAtriptan 100 MG tablet Commonly known as: IMITREX Take 100 mg by mouth every 2 (two) hours as needed for migraine.   topiramate 50 MG tablet Commonly known as: TOPAMAX Take 50 mg by mouth daily.   traZODone 100 MG tablet Commonly known as: DESYREL Take 100 mg by mouth at bedtime.         Discharge Exam: Filed Weights   01/08/22 2345 01/09/22 1200 01/10/22 1829  Weight: (!) 147.4 kg (!) 149.4 kg (!) 149.3 kg   General-appears in no acute distress Heart-S1-S2, regular, no murmur auscultated Lungs-scattered wheezing bilaterally Abdomen-soft, nontender, no organomegaly Extremities-no edema in the lower extremities Neuro-alert, oriented x3, no focal deficit noted  Condition at discharge: fair  The results of significant diagnostics from this hospitalization (including imaging, microbiology, ancillary and laboratory) are listed below for reference.   Imaging Studies: DG Chest Port 1 View  Result Date: 01/09/2022 CLINICAL DATA:  Shortness of breath. EXAM: PORTABLE CHEST 1 VIEW COMPARISON:  Chest radiograph dated 07/04/2018. FINDINGS: The heart size and mediastinal contours are within normal limits. Both lungs are clear. The visualized skeletal structures are unremarkable. IMPRESSION: No active disease. Electronically Signed   By: Elgie Collard M.D.   On:  01/09/2022 01:01    Microbiology: Results for orders placed or performed during the hospital encounter of 01/08/22  Resp Panel by RT-PCR (Flu A&B, Covid) Nasopharyngeal Swab     Status: None   Collection Time: 01/09/22  1:00 AM   Specimen: Nasopharyngeal Swab; Nasopharyngeal(NP) swabs in vial transport medium  Result Value Ref Range Status   SARS Coronavirus 2 by RT PCR NEGATIVE NEGATIVE Final    Comment: (NOTE) SARS-CoV-2 target nucleic acids are NOT DETECTED.  The SARS-CoV-2 RNA is generally detectable in upper respiratory specimens during the acute phase of infection. The lowest concentration of SARS-CoV-2 viral  copies this assay can detect is 138 copies/mL. A negative result does not preclude SARS-Cov-2 infection and should not be used as the sole basis for treatment or other patient management decisions. A negative result may occur with  improper specimen collection/handling, submission of specimen other than nasopharyngeal swab, presence of viral mutation(s) within the areas targeted by this assay, and inadequate number of viral copies(<138 copies/mL). A negative result must be combined with clinical observations, patient history, and epidemiological information. The expected result is Negative.  Fact Sheet for Patients:  BloggerCourse.com  Fact Sheet for Healthcare Providers:  SeriousBroker.it  This test is no t yet approved or cleared by the [place] FDA and  has been authorized for detection and/or diagnosis of SARS-CoV-2 by FDA under an Emergency Use Authorization (EUA). This EUA will remain  in effect (meaning this test can be used) for the duration of the COVID-19 declaration under Section 564(b)(1) of the Act, 21 U.S.C.section 360bbb-3(b)(1), unless the authorization is terminated  or revoked sooner.       Influenza A by PCR NEGATIVE NEGATIVE Final   Influenza B by PCR NEGATIVE NEGATIVE Final    Comment: (NOTE) The Xpert Xpress SARS-CoV-2/FLU/RSV plus assay is intended as an aid in the diagnosis of influenza from Nasopharyngeal swab specimens and should not be used as a sole basis for treatment. Nasal washings and aspirates are unacceptable for Xpert Xpress SARS-CoV-2/FLU/RSV testing.  Fact Sheet for Patients: BloggerCourse.com  Fact Sheet for Healthcare Providers: SeriousBroker.it  This test is not yet approved or cleared by the [place] FDA and has been authorized for detection and/or diagnosis of SARS-CoV-2 by FDA under an Emergency Use Authorization (EUA). This  EUA will remain in effect (meaning this test can be used) for the duration of the COVID-19 declaration under Section 564(b)(1) of the Act, 21 U.S.C. section 360bbb-3(b)(1), unless the authorization is terminated or revoked.  Performed at [facility], [address] [place], [place] [address]     Labs: CBC: Recent Labs  Lab 01/09/22 0100 01/10/22 0426  WBC 6.8 20.4*  NEUTROABS 5.6  --   HGB 14.7 14.5  HCT 44.5 43.2  MCV 89.7 89.6  PLT 289 294   Basic Metabolic Panel: Recent Labs  Lab 01/09/22 0100 01/10/22 0426  NA 135 137  K 3.8 3.5  CL 109 108  CO2 21* 22  GLUCOSE 129* 153*  BUN 12 11  CREATININE 0.83 0.90  CALCIUM 8.4* 8.6*   Liver Function Tests: Recent Labs  Lab 01/10/22 0426  AST 21  ALT 23  ALKPHOS 82  BILITOT 0.3  PROT 6.2*  ALBUMIN 3.3*   CBG: Recent Labs  Lab 01/11/22 0726 01/11/22 1201 01/11/22 1645 01/12/22 0734 01/12/22 1112  GLUCAP 68* 99 132* 77 91    Discharge time spent: greater than 30 minutes.  Signed: Meredeth Ide, MD [facility]  01/12/2022 ° ° ° ° ° ° ° ° °

## 2022-01-18 LAB — GLUCOSE, CAPILLARY: Glucose-Capillary: 113 mg/dL — ABNORMAL HIGH (ref 70–99)

## 2022-02-08 DIAGNOSIS — Z419 Encounter for procedure for purposes other than remedying health state, unspecified: Secondary | ICD-10-CM | POA: Diagnosis not present

## 2022-02-24 ENCOUNTER — Encounter: Payer: Self-pay | Admitting: Pulmonary Disease

## 2022-02-24 ENCOUNTER — Ambulatory Visit (INDEPENDENT_AMBULATORY_CARE_PROVIDER_SITE_OTHER): Payer: 59 | Admitting: Pulmonary Disease

## 2022-02-24 VITALS — BP 134/78 | HR 91 | Temp 98.0°F | Ht 67.0 in | Wt 330.0 lb

## 2022-02-24 DIAGNOSIS — J4540 Moderate persistent asthma, uncomplicated: Secondary | ICD-10-CM

## 2022-02-24 DIAGNOSIS — R0982 Postnasal drip: Secondary | ICD-10-CM | POA: Diagnosis not present

## 2022-02-24 MED ORDER — IPRATROPIUM BROMIDE 0.03 % NA SOLN: 2.0000 | Freq: Two times a day (BID) | NASAL | 12 refills | Status: DC

## 2022-02-24 NOTE — Patient Instructions (Signed)
Continue advair inhaler 1 puff twice daily ?- rinse mouth out after each use ? ?Continue Xopenex inhaler as needed 1-2 puffs every 4-6 hours as needed ? ?Continue Xyzal allergy medicine daily ? ?Continue singulair daily for allergies/asthma ? ?Follow up in 3 months with pulmonary function tests ?

## 2022-02-24 NOTE — Progress Notes (Signed)
? ?Synopsis: Referred in April 2023 for asthma by Marva Panda, NP ? ?Subjective:  ? ?PATIENT ID: Michele Craig GENDER: female DOB: [birth year], MRN: [id number] ? ?HPI ? ?Chief Complaint  ?Patient presents with  ? Consult  ?  When taking deep breath, can tell it is tight. SOB on exertion. Coughing and wheezing increased. Productive cough with thick sputum.  ? ?Michele Craig is a 51 year old woman, never smoker with DMII, rheumatoid arthritis, GERD, OSA not on CPAP, and hypertension who is referred to pulmonary clinic for asthma.  ? ?She was treated for asthma exacerbation 3/2 to 3/5. She had influenza A in 09/2021 where she was treated in the ER.  ? ?She reports having wheezing a daily basis prior to her hospitalization and increasing dyspnea along with trouble sleeping. She was started on advair inhaler 250-39mcg 1 puff twice daily after her recent hospitalization. She reports only needing xopenex inhaler before bedtime for wheezing. She is currently taking Xyzal and singulair for allergies. She does report sinus congestion and post nasal drainage. ? ?Her asthma triggers include spring allergies, cold air, cigarette smoke, pets and strong perfumes/colognes.  ? ?She reports having asthma since childhood. She was a premature baby. She was exposed to second hand smoke from her father. She is a never smoker. She denies dust of chemical exposures in the past. She has a pet dog at home.  ? ?Past Medical History:  ?Diagnosis Date  ? Anemia   ? Anxiety   ? Arthritis   ? Asthma   ? Complication of anesthesia   ? with carpal tunnel syndrome the block didn't work well, she could feel things during the surgery  ? Depression   ? Diabetes mellitus   ? pt states after her gastric sleeve procedure she is no longer on medications and is not checked for it  ? Family history of adverse reaction to anesthesia   ? unknown, pt is adopted  ? Fibromyalgia   ? Foot pain   ? GERD (gastroesophageal reflux disease)   ? Headache   ?  Hypertension   ? IBS (irritable bowel syndrome)   ? after cholecystectomy and then it went away after Gastric sleeve  ? Pneumonia   ? RA (rheumatoid arthritis) (HCC)   ? Shortness of breath dyspnea   ? only when her asthma is flaring up  ? Sleep apnea   ? does not use cpap  ?  ? ?Family History  ?Adopted: Yes  ?Family history unknown: Yes  ?  ? ?Social History  ? ?Socioeconomic History  ? Marital status: Married  ?  Spouse name: Not on file  ? Number of children: Not on file  ? Years of education: Not on file  ? Highest education level: Not on file  ?Occupational History  ? Not on file  ?Tobacco Use  ? Smoking status: Never  ? Smokeless tobacco: Never  ?Vaping Use  ? Vaping Use: Never used  ?Substance and Sexual Activity  ? Alcohol use: Not Currently  ? Drug use: No  ? Sexual activity: Yes  ?  Birth control/protection: I.U.D.  ?  Comment: 1st intercourse 48 yo-5 partners  ?Other Topics Concern  ? Not on file  ?Social History Narrative  ? Not on file  ? ?Social Determinants of Health  ? ?Financial Resource Strain: Not on file  ?Food Insecurity: Not on file  ?Transportation Needs: Not on file  ?Physical Activity: Not on file  ?Stress: Not on file  ?Social  Connections: Not on file  ?Intimate Partner Violence: Not on file  ?  ? ?Allergies  ?Allergen Reactions  ? Shrimp [Shellfish Allergy] Anaphylaxis  ?  Crawdads   ? Penicillins Hives  ?  Has patient had a PCN reaction causing immediate rash, facial/tongue/throat swelling, SOB or lightheadedness with hypotension: No ?Has patient had a PCN reaction causing severe rash involving mucus membranes or skin necrosis: No ?Has patient had a PCN reaction that required hospitalization No ?Has patient had a PCN reaction occurring within the last 10 years: No ?If all of the above answers are "NO", then may proceed with Cephalosporin use.  ? Zosyn [Piperacillin Sod-Tazobactam So] Hives and Itching  ?  ? ?Outpatient Medications Prior to Visit  ?Medication Sig Dispense Refill  ? ADVAIR  DISKUS 250-50 MCG/DOSE AEPB Inhale 1 puff into the lungs 2 (two) times daily.     ? ARIPiprazole (ABILIFY) 10 MG tablet Take 10 mg by mouth every morning.    ? buPROPion (WELLBUTRIN XL) 150 MG 24 hr tablet Take 300 mg by mouth daily.    ? CIMZIA PREFILLED 2 X 200 MG/ML PSKT Inject into the skin.    ? clonazePAM (KLONOPIN) 2 MG tablet Take 2 mg by mouth at bedtime.    ? DULoxetine HCl (CYMBALTA PO) Take 90 mg by mouth.    ? fluticasone (FLONASE) 50 MCG/ACT nasal spray Place 1 spray into both nostrils daily as needed for allergies.    ? fluticasone (FLOVENT HFA) 44 MCG/ACT inhaler Inhale 2 puffs into the lungs daily as needed (for shortness of breath).    ? gabapentin (NEURONTIN) 100 MG capsule 400 mg 2 (two) times daily.    ? hydroxychloroquine (PLAQUENIL) 200 MG tablet Take 200 mg by mouth 2 (two) times daily.    ? levalbuterol (XOPENEX) 0.63 MG/3ML nebulizer solution Take 3 mLs (0.63 mg total) by nebulization every 6 (six) hours as needed for wheezing or shortness of breath. 30 mL 5  ? levocetirizine (XYZAL) 5 MG tablet SMARTSIG:1 Tablet(s) By Mouth Every Evening    ? levonorgestrel (MIRENA, 52 MG,) 20 MCG/DAY IUD Mirena 20 mcg/24 hours (7 yrs) 52 mg intrauterine device ? Take 1 device by intrauterine route.    ? montelukast (SINGULAIR) 10 MG tablet Take 10 mg by mouth at bedtime.    ? omeprazole (PRILOSEC) 20 MG capsule Take 20 mg by mouth every evening.    ? SUMAtriptan (IMITREX) 100 MG tablet Take 100 mg by mouth every 2 (two) hours as needed for migraine.    ? topiramate (TOPAMAX) 50 MG tablet Take 50 mg by mouth daily.    ? traZODone (DESYREL) 100 MG tablet Take 100 mg by mouth at bedtime.    ? phentermine (ADIPEX-P) 37.5 MG tablet Take 37.5 mg by mouth every morning. (Patient not taking: Reported on 01/09/2022)    ? DULoxetine (CYMBALTA) 30 MG capsule Take 30 mg by mouth at bedtime.    ? DULoxetine (CYMBALTA) 60 MG capsule Take 60 mg by mouth at bedtime.    ? predniSONE (DELTASONE) 10 MG tablet Prednisone 30 mg  po daily x 1 day then Prednisone 20 mg po daily x 1 day then Prednisone 10 mg daily x 1 day then stop... 6 tablet 0  ? ?No facility-administered medications prior to visit.  ? ?Review of Systems  ?Constitutional:  Negative for chills, fever, malaise/fatigue and weight loss.  ?HENT:  Negative for congestion, sinus pain and sore throat.   ?Eyes: Negative.   ?Respiratory:  Positive for shortness of breath and wheezing. Negative for cough, hemoptysis and sputum production.   ?Cardiovascular:  Negative for chest pain, palpitations, orthopnea, claudication and leg swelling.  ?Gastrointestinal:  Negative for abdominal pain, heartburn, nausea and vomiting.  ?Genitourinary: Negative.   ?Musculoskeletal:  Negative for joint pain and myalgias.  ?Skin:  Negative for rash.  ?Neurological:  Negative for weakness.  ?Endo/Heme/Allergies:  Positive for environmental allergies.  ?Psychiatric/Behavioral: Negative.    ? ?Objective:  ? ?Vitals:  ? 02/24/22 1606  ?BP: 134/78  ?Pulse: 91  ?Temp: 98 ?F (36.7 ?C)  ?TempSrc: Oral  ?SpO2: 98%  ?Weight: (!) 330 lb (149.7 kg)  ?Height: 5\' 7"  (1.702 m)  ? ? ?Physical Exam ?Constitutional:   ?   General: She is not in acute distress. ?   Appearance: She is obese. She is not ill-appearing.  ?HENT:  ?   Head: Normocephalic and atraumatic.  ?Eyes:  ?   General: No scleral icterus. ?   Conjunctiva/sclera: Conjunctivae normal.  ?   Pupils: Pupils are equal, round, and reactive to light.  ?Cardiovascular:  ?   Rate and Rhythm: Normal rate and regular rhythm.  ?   Pulses: Normal pulses.  ?   Heart sounds: Normal heart sounds. No murmur heard. ?Pulmonary:  ?   Effort: Pulmonary effort is normal.  ?   Breath sounds: Normal breath sounds. No wheezing, rhonchi or rales.  ?Abdominal:  ?   General: Bowel sounds are normal.  ?   Palpations: Abdomen is soft.  ?Musculoskeletal:  ?   Right lower leg: No edema.  ?   Left lower leg: No edema.  ?Lymphadenopathy:  ?   Cervical: No cervical adenopathy.  ?Skin: ?    General: Skin is warm and dry.  ?Neurological:  ?   General: No focal deficit present.  ?   Mental Status: She is alert.  ?Psychiatric:     ?   Mood and Affect: Mood normal.     ?   Behavior: Behavior normal.

## 2022-03-13 ENCOUNTER — Ambulatory Visit: Payer: 59 | Admitting: Nurse Practitioner

## 2022-03-30 ENCOUNTER — Encounter (HOSPITAL_COMMUNITY): Payer: Self-pay

## 2022-03-30 ENCOUNTER — Emergency Department (HOSPITAL_COMMUNITY): Payer: 59

## 2022-03-30 ENCOUNTER — Other Ambulatory Visit: Payer: Self-pay

## 2022-03-30 ENCOUNTER — Emergency Department (HOSPITAL_COMMUNITY)
Admission: EM | Admit: 2022-03-30 | Discharge: 2022-03-30 | Disposition: A | Discharge status: Home / Self Care | Payer: 59 | Attending: Emergency Medicine | Admitting: Emergency Medicine

## 2022-03-30 DIAGNOSIS — N200 Calculus of kidney: Secondary | ICD-10-CM | POA: Diagnosis not present

## 2022-03-30 DIAGNOSIS — J45909 Unspecified asthma, uncomplicated: Secondary | ICD-10-CM | POA: Insufficient documentation

## 2022-03-30 DIAGNOSIS — E119 Type 2 diabetes mellitus without complications: Secondary | ICD-10-CM | POA: Insufficient documentation

## 2022-03-30 DIAGNOSIS — Z79899 Other long term (current) drug therapy: Secondary | ICD-10-CM | POA: Insufficient documentation

## 2022-03-30 DIAGNOSIS — N9489 Other specified conditions associated with female genital organs and menstrual cycle: Secondary | ICD-10-CM | POA: Insufficient documentation

## 2022-03-30 DIAGNOSIS — Z7951 Long term (current) use of inhaled steroids: Secondary | ICD-10-CM | POA: Insufficient documentation

## 2022-03-30 DIAGNOSIS — I10 Essential (primary) hypertension: Secondary | ICD-10-CM | POA: Diagnosis not present

## 2022-03-30 DIAGNOSIS — R300 Dysuria: Secondary | ICD-10-CM | POA: Diagnosis present

## 2022-03-30 LAB — CBC WITH DIFFERENTIAL/PLATELET
Abs Immature Granulocytes: 0.03 10*3/uL (ref 0.00–0.07)
Basophils Absolute: 0 10*3/uL (ref 0.0–0.1)
Basophils Relative: 1 %
Eosinophils Absolute: 0.1 10*3/uL (ref 0.0–0.5)
Eosinophils Relative: 2 %
HCT: 37.8 % (ref 36.0–46.0)
Hemoglobin: 12.7 g/dL (ref 12.0–15.0)
Immature Granulocytes: 1 %
Lymphocytes Relative: 42 %
Lymphs Abs: 2.6 10*3/uL (ref 0.7–4.0)
MCH: 30.5 pg (ref 26.0–34.0)
MCHC: 33.6 g/dL (ref 30.0–36.0)
MCV: 90.9 fL (ref 80.0–100.0)
Monocytes Absolute: 0.6 10*3/uL (ref 0.1–1.0)
Monocytes Relative: 11 %
Neutro Abs: 2.6 10*3/uL (ref 1.7–7.7)
Neutrophils Relative %: 43 %
Platelets: 293 10*3/uL (ref 150–400)
RBC: 4.16 MIL/uL (ref 3.87–5.11)
RDW: 13.2 % (ref 11.5–15.5)
WBC: 6.1 10*3/uL (ref 4.0–10.5)
nRBC: 0 % (ref 0.0–0.2)

## 2022-03-30 LAB — URINALYSIS, ROUTINE W REFLEX MICROSCOPIC
Bilirubin Urine: NEGATIVE
Glucose, UA: NEGATIVE mg/dL
Hgb urine dipstick: NEGATIVE
Ketones, ur: NEGATIVE mg/dL
Leukocytes,Ua: NEGATIVE
Nitrite: NEGATIVE
Protein, ur: NEGATIVE mg/dL
Specific Gravity, Urine: 1.016 (ref 1.005–1.030)
pH: 6 (ref 5.0–8.0)

## 2022-03-30 LAB — I-STAT BETA HCG BLOOD, ED (MC, WL, AP ONLY): I-stat hCG, quantitative: <5 m[IU]/mL (ref <5)

## 2022-03-30 LAB — BASIC METABOLIC PANEL
Anion gap: 3 — ABNORMAL LOW (ref 5–15)
BUN: 9 mg/dL (ref 6–20)
CO2: 24 mmol/L (ref 22–32)
Calcium: 8.3 mg/dL — ABNORMAL LOW (ref 8.9–10.3)
Chloride: 115 mmol/L — ABNORMAL HIGH (ref 98–111)
Creatinine, Ser: 0.88 mg/dL (ref 0.44–1.00)
GFR, Estimated: >60 mL/min (ref >60)
Glucose, Bld: 87 mg/dL (ref 70–99)
Potassium: 3.4 mmol/L — ABNORMAL LOW (ref 3.5–5.1)
Sodium: 142 mmol/L (ref 135–145)

## 2022-03-30 MED ORDER — KETOROLAC TROMETHAMINE 30 MG/ML IJ SOLN
30.0000 mg | Freq: Once | INTRAMUSCULAR | Status: AC
  Administered 2022-03-30: 30 mg via INTRAVENOUS
  Filled 2022-03-30: qty 1

## 2022-03-30 MED ORDER — OXYCODONE-ACETAMINOPHEN 5-325 MG PO TABS: 1.0000 | ORAL_TABLET | Freq: Four times a day (QID) | ORAL | 0 refills | Status: AC | PRN

## 2022-03-30 MED ORDER — TAMSULOSIN HCL 0.4 MG PO CAPS: 0.4000 mg | ORAL_CAPSULE | Freq: Every day | ORAL | 0 refills | Status: DC

## 2022-03-30 MED ORDER — ONDANSETRON HCL 4 MG/2ML IJ SOLN
4.0000 mg | Freq: Once | INTRAMUSCULAR | Status: AC
  Administered 2022-03-30: 4 mg via INTRAVENOUS
  Filled 2022-03-30: qty 2

## 2022-03-30 MED ORDER — IOHEXOL 300 MG/ML  SOLN
100.0000 mL | Freq: Once | INTRAMUSCULAR | Status: AC | PRN
Start: 2022-03-30 — End: 2022-03-30
  Administered 2022-03-30: 100 mL via INTRAVENOUS

## 2022-03-30 MED ORDER — ONDANSETRON 8 MG PO TBDP: 8.0000 mg | ORAL_TABLET | Freq: Three times a day (TID) | ORAL | 0 refills | Status: DC | PRN

## 2022-03-30 MED ORDER — SODIUM CHLORIDE (PF) 0.9 % IJ SOLN
INTRAMUSCULAR | Status: AC
  Filled 2022-03-30: qty 50

## 2022-03-30 MED ORDER — SODIUM CHLORIDE 0.9 % IV BOLUS
1000.0000 mL | Freq: Once | INTRAVENOUS | Status: AC
  Administered 2022-03-30: 1000 mL via INTRAVENOUS

## 2022-03-30 NOTE — ED Triage Notes (Signed)
Patient c/o bilateral flank pain and dysuria x6 days. Patient went to an UC 2 days ago and was prescribed antibiotics.

## 2022-03-30 NOTE — Discharge Instructions (Addendum)
Take Flomax at dinner.  This helps pass stones.  Take the Zofran every 8 hours as needed for nausea.  This dissolves under your tongue.  Try taking Tylenol and ibuprofen for the pain primarily.  You can take the Percocet for breakthrough pain just make sure you take the Zofran before taking it as the Percocet can cause nausea and vomiting.  Call Monday morning to schedule follow-up with the kidney doctor.

## 2022-03-30 NOTE — ED Provider Notes (Signed)
[facility]-EMERGENCY DEPT Provider Note   CSN: [id number] Arrival date & time: 03/30/22  1517     History  Chief Complaint  Patient presents with   Flank Pain   Dysuria    Michele Craig is a 51 y.o. female.   Flank Pain  Dysuria Associated symptoms: flank pain    Patient with PMH of fibromyalgia, asthma, diabetes, hypertension presents today due to dysuria and bilateral flank pain.  Patient is having dysuria and increased urinary frequency x5 days.  Was seen in urgent care 2 days ago and started on antibiotic, she is not sure which antibiotic it is that she taking it twice daily.  States the dysuria has improved but she still having increased urinary frequency and now she is having pain to the flanks bilaterally.  Denies any hematuria, she thinks she may be having fevers at home but has been taking Tylenol.  She is nauseated but no vomiting.  Home Medications Prior to Admission medications   Medication Sig Start Date End Date Taking? Authorizing Provider  ADVAIR DISKUS 250-50 MCG/DOSE AEPB Inhale 1 puff into the lungs 2 (two) times daily.  05/17/14  Yes [provider]  ARIPiprazole (ABILIFY) 10 MG tablet Take 10 mg by mouth every morning. 04/18/21  Yes [provider]  buPROPion (WELLBUTRIN XL) 150 MG 24 hr tablet Take 300 mg by mouth daily. 05/17/18  Yes [provider]  ciprofloxacin (CIPRO) 500 MG tablet Take 500 mg by mouth 2 (two) times daily. 03/27/22  Yes [provider]  clonazePAM (KLONOPIN) 2 MG tablet Take 2 mg by mouth at bedtime. 03/03/21  Yes [provider]  DULoxetine (CYMBALTA) 60 MG capsule Take 60 mg by mouth daily. 03/20/22  Yes [provider]  fluticasone (FLONASE) 50 MCG/ACT nasal spray Place 1 spray into both nostrils daily as needed for allergies. 04/17/21  Yes [provider]  fluticasone (FLOVENT HFA) 44 MCG/ACT inhaler Inhale 1 puff into the lungs daily as needed (for shortness  of breath).   Yes [provider]  gabapentin (NEURONTIN) 100 MG capsule 400 mg 2 (two) times daily. 07/07/21  Yes [provider]  hydroxychloroquine (PLAQUENIL) 200 MG tablet Take 200 mg by mouth 2 (two) times daily. 04/27/21  Yes [provider]  ipratropium (ATROVENT) 0.03 % nasal spray Place 2 sprays into both nostrils every 12 (twelve) hours. 02/24/22  Yes Martina Sinner, MD  levalbuterol Pauline Aus) 0.63 MG/3ML nebulizer solution Take 3 mLs (0.63 mg total) by nebulization every 6 (six) hours as needed for wheezing or shortness of breath. 01/12/22  Yes Meredeth Ide, MD  levocetirizine (XYZAL) 5 MG tablet Take 5 mg by mouth every evening. 04/30/21  Yes [provider]  levonorgestrel (MIRENA, 52 MG,) 20 MCG/DAY IUD Mirena 20 mcg/24 hours (7 yrs) 52 mg intrauterine device  Take 1 device by intrauterine route.   Yes [provider]  montelukast (SINGULAIR) 10 MG tablet Take 10 mg by mouth at bedtime.   Yes [provider]  omeprazole (PRILOSEC) 20 MG capsule Take 20 mg by mouth every evening. 07/05/21  Yes [provider]  ondansetron (ZOFRAN-ODT) 8 MG disintegrating tablet Take 1 tablet (8 mg total) by mouth every 8 (eight) hours as needed for nausea or vomiting. 03/30/22  Yes Theron Arista, PA-C  oxyCODONE-acetaminophen (PERCOCET/ROXICET) 5-325 MG tablet Take 1 tablet by mouth every 6 (six) hours as needed for up to 3 days for severe pain. 03/30/22 04/02/22 Yes Theron Arista,  PA-C  phentermine (ADIPEX-P) 37.5 MG tablet Take 37.5 mg by mouth every morning. 06/25/21  Yes [provider]  SUMAtriptan (IMITREX) 100 MG tablet Take 100 mg by mouth every 2 (two) hours as needed for migraine. 11/28/21  Yes [provider]  tamsulosin (FLOMAX) 0.4 MG CAPS capsule Take 1 capsule (0.4 mg total) by mouth daily after supper. 03/30/22  Yes Theron Arista, PA-C  topiramate (TOPAMAX) 50 MG tablet Take 50 mg by mouth daily. 01/29/21  Yes [provider]  traZODone (DESYREL) 100 MG tablet Take 100 mg by mouth at bedtime. 04/18/21  Yes [provider]  CIMZIA PREFILLED 2 X 200 MG/ML PSKT Inject into the skin. 04/22/21   [provider]      Allergies    Shrimp [shellfish allergy], Penicillins, Zosyn [piperacillin sod-tazobactam so], and Clindamycin    Review of Systems   Review of Systems  Genitourinary:  Positive for dysuria and flank pain.   Physical Exam Updated Vital Signs BP 135/86 (BP Location: Right Wrist)   Pulse 83   Temp 99.7 F (37.6 C) (Oral)   Resp 16   Ht 5' 7.5" (1.715 m)   Wt (!) 145.6 kg   SpO2 96%   BMI 49.53 kg/m  Physical Exam Vitals and nursing note reviewed. Exam conducted with a chaperone present.  Constitutional:      Appearance: Normal appearance.     Comments: BMI 49.53  HENT:     Head: Normocephalic and atraumatic.  Eyes:     General: No scleral icterus.       Right eye: No discharge.        Left eye: No discharge.     Extraocular Movements: Extraocular movements intact.     Pupils: Pupils are equal, round, and reactive to light.  Cardiovascular:     Rate and Rhythm: Regular rhythm. Tachycardia present.     Pulses: Normal pulses.     Heart sounds: Normal heart sounds. No murmur heard.   No friction rub. No gallop.  Pulmonary:     Effort: Pulmonary effort is normal. No respiratory distress.     Breath sounds: Normal breath sounds.  Abdominal:     General: Abdomen is flat. Bowel sounds are normal. There is no distension.     Palpations: Abdomen is soft.     Tenderness: There is no abdominal tenderness. There is right CVA tenderness and left CVA tenderness.  Skin:    General: Skin is warm and dry.     Coloration: Skin is not jaundiced.  Neurological:     Mental Status: She is alert. Mental status is at baseline.     Coordination: Coordination normal.    ED Results / Procedures / Treatments   Labs (all labs ordered are listed, but only abnormal results are  displayed) Labs Reviewed  BASIC METABOLIC PANEL - Abnormal; Notable for the following components:      Result Value   Potassium 3.4 (*)    Chloride 115 (*)    Calcium 8.3 (*)    Anion gap 3 (*)    All other components within normal limits  URINE CULTURE  URINALYSIS, ROUTINE W REFLEX MICROSCOPIC  CBC WITH DIFFERENTIAL/PLATELET  I-STAT BETA HCG BLOOD, ED ([facility], [facility], [facility] ONLY)    EKG None  Radiology CT ABDOMEN PELVIS W CONTRAST  Result Date: 03/30/2022 CLINICAL DATA:  Flank pain and dysuria for 6 days. EXAM: CT ABDOMEN AND PELVIS WITH CONTRAST TECHNIQUE: Multidetector CT imaging of the abdomen and  pelvis was performed using the standard protocol following bolus administration of intravenous contrast. RADIATION DOSE REDUCTION: This exam was performed according to the departmental dose-optimization program which includes automated exposure control, adjustment of the mA and/or kV according to patient size and/or use of iterative reconstruction technique. CONTRAST:  OMNIPAQUE IOHEXOL 300 MG/ML  SOLN COMPARISON:  CT abdomen pelvis dated 07/28/2007. FINDINGS: Lower chest: No acute abnormality. Hepatobiliary: No focal liver abnormality is seen. Status post cholecystectomy. No biliary dilatation. Pancreas: Unremarkable. No pancreatic ductal dilatation or surrounding inflammatory changes. Spleen: Normal in size without focal abnormality. Adrenals/Urinary Tract: Adrenal glands are unremarkable. There appears to be a 12 mm calculus and 3 additional calculi measuring up to 3 mm in size in the midportion of the right ureter, however the ureter is in close proximity with blood vessels and it is difficult to confirm that these calcifications are within the ureter as opposed to vascular calcifications. There is no hydroureteronephrosis. There is no focal lesion in the right kidney. The left kidney is normal, without renal calculi, focal lesion, or hydronephrosis. Bladder is unremarkable. Stomach/Bowel: The  patient is status post a duodenal switch procedure. Appendix appears normal. No evidence of bowel wall thickening, distention, or inflammatory changes. Vascular/Lymphatic: No significant vascular findings are present. No enlarged abdominal or pelvic lymph nodes. Reproductive: An intrauterine contraceptive device is noted. Otherwise, the uterus and bilateral adnexa are unremarkable. Other: No abdominal wall hernia or abnormality. No abdominopelvic ascites. Musculoskeletal: Degenerative changes are seen in the spine. IMPRESSION: 1. Multiple calculi measuring up to 12 mm in size appear to be located in the midportion of the right ureter. Although felt less likely, these may represent calcifications in an adjacent blood vessel as opposed to ureteral calculi. There is no hydroureteronephrosis. Electronically Signed   By: Romona Curls M.D.   On: 03/30/2022 12:14    Procedures Procedures    Medications Ordered in ED Medications  sodium chloride (PF) 0.9 % injection (has no administration in time range)  ondansetron (ZOFRAN) injection 4 mg (has no administration in time range)  sodium chloride 0.9 % bolus 1,000 mL (1,000 mLs Intravenous New Bag/Given 03/30/22 1032)  ketorolac (TORADOL) 30 MG/ML injection 30 mg (30 mg Intravenous Given 03/30/22 1044)  iohexol (OMNIPAQUE) 300 MG/ML solution 100 mL (100 mLs Intravenous Contrast Given 03/30/22 1145)    ED Course/ Medical Decision Making/ A&P                           Medical Decision Making Amount and/or Complexity of Data Reviewed Labs: ordered. Radiology: ordered.  Risk Prescription drug management.   Patient presents with bilateral flank pain and dysuria x5 to 7 days.  Differential diagnosis includes but not limited to UTI, pyelonephritis, nephrolithiasis, colitis, MSK.  Patient's husband is at bedside providing independent history.  States he also does not know which antibiotic patient has been on.  Unable to identify via chart review.    Reviewed patient's medication list and the changes were thought reasonable.  Also ordered patient Toradol and fluid bolus.  I ordered and reviewed labs.  Per my interpretation patient has no leukocytosis or underlying anemia.  UA is entirely unremarkable, no AKI.  Creatinine is within normal limits, slight hypokalemia at 3.4 no significant electrolyte derangement.  Patient is not pregnant.  Urine culture was obtained and is currently pending.  I ordered and viewed the CT abdomen with contrast.  There is no evidence of pyelonephritis, multiple likely renal  stones noted.  Largest is 12 mm in the mid right ureter.  Radiologist mentions it could be vascular calcifications although significantly less likely.  Agree with radiologist interpretation.  On reevaluation patient's pain is improved.  Proved with Zofran.  Discussed the CT results with the patient including that it could be a calcification but I have a high suspicion this is likely a kidney stone.  Will discharge home with pain medicine, Flomax and antiemetics.  Urology follow-up provided.    Given there is no hydronephrosis, do not feel patient needs admission at this time.  Urine is clear, no evidence of UTI and or pyelonephritis on the CT imaging.  Discussed HPI, physical exam and plan of care for this patient with attending Juventino Slovak. The attending physician evaluated this patient as part of a shared visit and agrees with plan of care.          Final Clinical Impression(s) / ED Diagnoses Final diagnoses:  Kidney stone    Rx / DC Orders ED Discharge Orders          Ordered    tamsulosin (FLOMAX) 0.4 MG CAPS capsule  Daily after supper        03/30/22 1242    ondansetron (ZOFRAN-ODT) 8 MG disintegrating tablet  Every 8 hours PRN        03/30/22 1242    oxyCODONE-acetaminophen (PERCOCET/ROXICET) 5-325 MG tablet  Every 6 hours PRN        03/30/22 1242              Theron Arista, PA-C 03/30/22 1243    Milagros Loll, MD 03/30/22 1400

## 2022-03-31 LAB — URINE CULTURE: Culture: NO GROWTH

## 2022-04-03 ENCOUNTER — Other Ambulatory Visit: Payer: Self-pay | Admitting: Urology

## 2022-04-04 ENCOUNTER — Encounter (HOSPITAL_COMMUNITY): Payer: Self-pay | Admitting: Anesthesiology

## 2022-04-04 ENCOUNTER — Encounter (HOSPITAL_BASED_OUTPATIENT_CLINIC_OR_DEPARTMENT_OTHER): Payer: Self-pay | Admitting: Urology

## 2022-04-04 NOTE — Progress Notes (Signed)
Reviewed pt's chart for pre-op interview for surgery scheduled on 04-08-2022 by Dr Lafonda Mosses.  Noted pt does have medically complex but also noted pt has new LBBB on EKG done 01-09-2022 that had not been addressed.  Called and spoke w/ anesthesia, Dr R. Fitzgerald MDA , via phone.  Dr R. Fitzgerald MDA stated unless an emergency pt will need to have cardiology clearance prior to surgery. Called and spoke w/ Elita Quick, OR scheduler for Dr Lafonda Mosses, informed her recommendation for cardiac clearance.

## 2022-04-08 ENCOUNTER — Ambulatory Visit (HOSPITAL_BASED_OUTPATIENT_CLINIC_OR_DEPARTMENT_OTHER): Admission: RE | Admit: 2022-04-08 | Payer: 59 | Source: Home / Self Care | Admitting: Urology

## 2022-04-08 HISTORY — DX: Type 2 diabetes mellitus without complications: E11.9

## 2022-04-08 HISTORY — DX: Moderate persistent asthma, uncomplicated: J45.40

## 2022-04-08 HISTORY — DX: Obstructive sleep apnea (adult) (pediatric): G47.33

## 2022-04-08 SURGERY — URETEROSCOPY, WITH LITHOTRIPSY USING HOLMIUM LASER
Anesthesia: General | Laterality: Right

## 2022-05-06 ENCOUNTER — Ambulatory Visit (INDEPENDENT_AMBULATORY_CARE_PROVIDER_SITE_OTHER): Payer: 59 | Admitting: Internal Medicine

## 2022-05-06 ENCOUNTER — Encounter: Payer: Self-pay | Admitting: Internal Medicine

## 2022-05-06 ENCOUNTER — Encounter: Payer: Self-pay | Admitting: *Deleted

## 2022-05-06 VITALS — BP 132/78 | HR 92 | Ht 67.0 in | Wt 327.0 lb

## 2022-05-06 DIAGNOSIS — R9431 Abnormal electrocardiogram [ECG] [EKG]: Secondary | ICD-10-CM

## 2022-05-06 NOTE — H&P (View-Only) (Signed)
Cardiology Office Note   Date:  05/06/2022   ID:  Michele Craig, DOB December 26, 1970, MRN [id number]  PCP:  Marva Panda, NP  Cardiologist:   Dietrich Pates, MD   Patient referred for evaluation of LBBB    History of Present Illness: Michele Craig is a 51 y.o. female with hx of morbid obesity, asthma but  no prior cardiac hx   She had an EKG done that showed LBBB  Note an EKG from March 2023 showed  LBBBB    EKG in 2017 did not.  Talking to the pt she denies dizziness, no signficant SOB when asthma is quiet.   No palpitations.  No CP  The pt is being evaluated for urologic surgery       Current Meds  Medication Sig   ADVAIR DISKUS 250-50 MCG/DOSE AEPB Inhale 1 puff into the lungs 2 (two) times daily.    ARIPiprazole (ABILIFY) 10 MG tablet Take 10 mg by mouth every morning.   buPROPion (WELLBUTRIN XL) 150 MG 24 hr tablet Take 300 mg by mouth daily.   CIMZIA PREFILLED 2 X 200 MG/ML PSKT Inject into the skin.   ciprofloxacin (CIPRO) 500 MG tablet Take 500 mg by mouth 2 (two) times daily.   clonazePAM (KLONOPIN) 2 MG tablet Take 2 mg by mouth at bedtime.   DULoxetine (CYMBALTA) 60 MG capsule Take 60 mg by mouth daily.   fluticasone (FLONASE) 50 MCG/ACT nasal spray Place 1 spray into both nostrils daily as needed for allergies.   fluticasone (FLOVENT HFA) 44 MCG/ACT inhaler Inhale 1 puff into the lungs daily as needed (for shortness of breath).   gabapentin (NEURONTIN) 100 MG capsule 400 mg 2 (two) times daily.   hydroxychloroquine (PLAQUENIL) 200 MG tablet Take 200 mg by mouth 2 (two) times daily.   ipratropium (ATROVENT) 0.03 % nasal spray Place 2 sprays into both nostrils every 12 (twelve) hours.   levalbuterol (XOPENEX) 0.63 MG/3ML nebulizer solution Take 3 mLs (0.63 mg total) by nebulization every 6 (six) hours as needed for wheezing or shortness of breath.   levocetirizine (XYZAL) 5 MG tablet Take 5 mg by mouth every evening.   levonorgestrel (MIRENA, 52 MG,) 20 MCG/DAY IUD  Mirena 20 mcg/24 hours (7 yrs) 52 mg intrauterine device  Take 1 device by intrauterine route.   montelukast (SINGULAIR) 10 MG tablet Take 10 mg by mouth at bedtime.   omeprazole (PRILOSEC) 20 MG capsule Take 20 mg by mouth every evening.   ondansetron (ZOFRAN-ODT) 8 MG disintegrating tablet Take 1 tablet (8 mg total) by mouth every 8 (eight) hours as needed for nausea or vomiting.   phentermine (ADIPEX-P) 37.5 MG tablet Take 37.5 mg by mouth every morning.   SUMAtriptan (IMITREX) 100 MG tablet Take 100 mg by mouth every 2 (two) hours as needed for migraine.   tamsulosin (FLOMAX) 0.4 MG CAPS capsule Take 1 capsule (0.4 mg total) by mouth daily after supper.   topiramate (TOPAMAX) 50 MG tablet Take 50 mg by mouth daily.   traZODone (DESYREL) 100 MG tablet Take 100 mg by mouth at bedtime.     Allergies:   Shrimp [shellfish allergy], Penicillins, Zosyn [piperacillin sod-tazobactam so], and Clindamycin   Past Medical History:  Diagnosis Date   Anemia    Anxiety    Arthritis    Complication of anesthesia    with carpal tunnel syndrome the block didn't work well, she could feel things during the surgery   Depression    Family  history of adverse reaction to anesthesia    unknown, pt is adopted   Fibromyalgia    Foot pain    GERD (gastroesophageal reflux disease)    Headache    Hypertension    IBS (irritable bowel syndrome)    after cholecystectomy and then it went away after Gastric sleeve   Moderate persistent asthma    OSA (obstructive sleep apnea)    does not use cpap   RA (rheumatoid arthritis) (HCC)    Shortness of breath dyspnea    only when her asthma is flaring up   Type 2 diabetes mellitus (HCC)    pt states after her gastric sleeve procedure she is no longer on medications and is not checked for it    Past Surgical History:  Procedure Laterality Date   CARPAL TUNNEL RELEASE     CARPAL TUNNEL RELEASE Right 04/04/2016   Procedure: RIGHT CARPAL TUNNEL RELEASE;  Surgeon:  Frederico Hamman, MD;  Location: [facility] OR;  Service: Orthopedics;  Laterality: Right;  Synovectomy    CESAREAN SECTION     x 2   CHOLECYSTECTOMY     deuodenal switch     DILATION AND CURETTAGE OF UTERUS     LAPAROSCOPIC GASTRIC SLEEVE RESECTION     TONSILLECTOMY       Social History:  The patient  reports that she has never smoked. She has never used smokeless tobacco. She reports that she does not currently use alcohol. She reports that she does not use drugs.   Family History:  The patient's She was adopted. Family history is unknown by patient.    ROS:  Please see the history of present illness. All other systems are reviewed and  Negative to the above problem except as noted.    PHYSICAL EXAM: VS:  BP 132/78   Pulse 92   Ht 5\' 7"  (1.702 m)   Wt (!) 327 lb (148.3 kg)   SpO2 96%   BMI 51.22 kg/m   GEN: Morbidly obese 51 yo in no acute distress  HEENT: normal  Neck: no JVD, carotid bruits, Cardiac: RRR; no murmurs No LE edema  Respiratory:  clear to auscultation bilaterally,  GI: Obese   Nontender   No defiinite masses MS: no deformity Moving all extremities   Skin: warm and dry, no rash Neuro:  Strength and sensation are intact Psych: euthymic mood, full affect   EKG:  EKG is ordered today.    SR 92 bpm   LBBB   Lipid Panel No results found for: "CHOL", "TRIG", "HDL", "CHOLHDL", "VLDL", "LDLCALC", "LDLDIRECT"    Wt Readings from Last 3 Encounters:  05/06/22 (!) 327 lb (148.3 kg)  03/30/22 (!) 321 lb (145.6 kg)  02/24/22 (!) 330 lb (149.7 kg)      ASSESSMENT AND PLAN:  1  LBBB   New this year   Pt without dizziness or SOB  No sympotms of CAD  I would recomm, esp since preopr, getting a lexiscan myoview   Would also set up for an echo  2  HCM  Discussed diet,   Mediterranean style, low carb          F/U based on test results.     ADDENDUM   06/02/22 Pt had echo on 05/28/22  Abnormal    LVEF 40 to 45%  Akinetic anterior, anteroseptal, inferoseptal wall.      Myview showed showed distal septal defect apical defect   Difficult to assess on still frames reversibility  LVEF depressed   Given that both tests are abnormal, I would  recomm L heart cath to define anatomy    Discussed with patient    Risks/benefits explained.   Pt understands and agrees to proceed.    Signed, Dietrich Pates, MD  05/06/2022 3:52 PM    [facility] [address] [place], [place], [place]  [address] Phone: [phone]; Fax: [phone]

## 2022-05-06 NOTE — Progress Notes (Addendum)
 Cardiology Office Note   Date:  05/06/2022   ID:  Michele Craig, DOB 09/19/1971, MRN [id number]  PCP:  Millsaps, Kimberly, NP  Cardiologist:   Velicia Dejager, MD   Patient referred for evaluation of LBBB    History of Present Illness: Michele Craig is a 51 y.o. female with hx of morbid obesity, asthma but  no prior cardiac hx   She had an EKG done that showed LBBB  Note an EKG from March 2023 showed  LBBBB    EKG in 2017 did not.  Talking to the pt she denies dizziness, no signficant SOB when asthma is quiet.   No palpitations.  No CP  The pt is being evaluated for urologic surgery       Current Meds  Medication Sig   ADVAIR DISKUS 250-50 MCG/DOSE AEPB Inhale 1 puff into the lungs 2 (two) times daily.    ARIPiprazole (ABILIFY) 10 MG tablet Take 10 mg by mouth every morning.   buPROPion (WELLBUTRIN XL) 150 MG 24 hr tablet Take 300 mg by mouth daily.   CIMZIA PREFILLED 2 X 200 MG/ML PSKT Inject into the skin.   ciprofloxacin (CIPRO) 500 MG tablet Take 500 mg by mouth 2 (two) times daily.   clonazePAM (KLONOPIN) 2 MG tablet Take 2 mg by mouth at bedtime.   DULoxetine (CYMBALTA) 60 MG capsule Take 60 mg by mouth daily.   fluticasone (FLONASE) 50 MCG/ACT nasal spray Place 1 spray into both nostrils daily as needed for allergies.   fluticasone (FLOVENT HFA) 44 MCG/ACT inhaler Inhale 1 puff into the lungs daily as needed (for shortness of breath).   gabapentin (NEURONTIN) 100 MG capsule 400 mg 2 (two) times daily.   hydroxychloroquine (PLAQUENIL) 200 MG tablet Take 200 mg by mouth 2 (two) times daily.   ipratropium (ATROVENT) 0.03 % nasal spray Place 2 sprays into both nostrils every 12 (twelve) hours.   levalbuterol (XOPENEX) 0.63 MG/3ML nebulizer solution Take 3 mLs (0.63 mg total) by nebulization every 6 (six) hours as needed for wheezing or shortness of breath.   levocetirizine (XYZAL) 5 MG tablet Take 5 mg by mouth every evening.   levonorgestrel (MIRENA, 52 MG,) 20 MCG/DAY IUD  Mirena 20 mcg/24 hours (7 yrs) 52 mg intrauterine device  Take 1 device by intrauterine route.   montelukast (SINGULAIR) 10 MG tablet Take 10 mg by mouth at bedtime.   omeprazole (PRILOSEC) 20 MG capsule Take 20 mg by mouth every evening.   ondansetron (ZOFRAN-ODT) 8 MG disintegrating tablet Take 1 tablet (8 mg total) by mouth every 8 (eight) hours as needed for nausea or vomiting.   phentermine (ADIPEX-P) 37.5 MG tablet Take 37.5 mg by mouth every morning.   SUMAtriptan (IMITREX) 100 MG tablet Take 100 mg by mouth every 2 (two) hours as needed for migraine.   tamsulosin (FLOMAX) 0.4 MG CAPS capsule Take 1 capsule (0.4 mg total) by mouth daily after supper.   topiramate (TOPAMAX) 50 MG tablet Take 50 mg by mouth daily.   traZODone (DESYREL) 100 MG tablet Take 100 mg by mouth at bedtime.     Allergies:   Shrimp [shellfish allergy], Penicillins, Zosyn [piperacillin sod-tazobactam so], and Clindamycin   Past Medical History:  Diagnosis Date   Anemia    Anxiety    Arthritis    Complication of anesthesia    with carpal tunnel syndrome the block didn't work well, she could feel things during the surgery   Depression    Family   history of adverse reaction to anesthesia    unknown, pt is adopted   Fibromyalgia    Foot pain    GERD (gastroesophageal reflux disease)    Headache    Hypertension    IBS (irritable bowel syndrome)    after cholecystectomy and then it went away after Gastric sleeve   Moderate persistent asthma    OSA (obstructive sleep apnea)    does not use cpap   RA (rheumatoid arthritis) (HCC)    Shortness of breath dyspnea    only when her asthma is flaring up   Type 2 diabetes mellitus (HCC)    pt states after her gastric sleeve procedure she is no longer on medications and is not checked for it    Past Surgical History:  Procedure Laterality Date   CARPAL TUNNEL RELEASE     CARPAL TUNNEL RELEASE Right 04/04/2016   Procedure: RIGHT CARPAL TUNNEL RELEASE;  Surgeon:  Frederico Hamman, MD;  Location: [facility] OR;  Service: Orthopedics;  Laterality: Right;  Synovectomy    CESAREAN SECTION     x 2   CHOLECYSTECTOMY     deuodenal switch     DILATION AND CURETTAGE OF UTERUS     LAPAROSCOPIC GASTRIC SLEEVE RESECTION     TONSILLECTOMY       Social History:  The patient  reports that she has never smoked. She has never used smokeless tobacco. She reports that she does not currently use alcohol. She reports that she does not use drugs.   Family History:  The patient's She was adopted. Family history is unknown by patient.    ROS:  Please see the history of present illness. All other systems are reviewed and  Negative to the above problem except as noted.    PHYSICAL EXAM: VS:  BP 132/78   Pulse 92   Ht 5\' 7"  (1.702 m)   Wt (!) 327 lb (148.3 kg)   SpO2 96%   BMI 51.22 kg/m   GEN: Morbidly obese 51 yo in no acute distress  HEENT: normal  Neck: no JVD, carotid bruits, Cardiac: RRR; no murmurs No LE edema  Respiratory:  clear to auscultation bilaterally,  GI: Obese   Nontender   No defiinite masses MS: no deformity Moving all extremities   Skin: warm and dry, no rash Neuro:  Strength and sensation are intact Psych: euthymic mood, full affect   EKG:  EKG is ordered today.    SR 92 bpm   LBBB   Lipid Panel No results found for: "CHOL", "TRIG", "HDL", "CHOLHDL", "VLDL", "LDLCALC", "LDLDIRECT"    Wt Readings from Last 3 Encounters:  05/06/22 (!) 327 lb (148.3 kg)  03/30/22 (!) 321 lb (145.6 kg)  02/24/22 (!) 330 lb (149.7 kg)      ASSESSMENT AND PLAN:  1  LBBB   New this year   Pt without dizziness or SOB  No sympotms of CAD  I would recomm, esp since preopr, getting a lexiscan myoview   Would also set up for an echo  2  HCM  Discussed diet,   Mediterranean style, low carb          F/U based on test results.     ADDENDUM   06/02/22 Pt had echo on 05/28/22  Abnormal    LVEF 40 to 45%  Akinetic anterior, anteroseptal, inferoseptal wall.      Myview showed showed distal septal defect apical defect   Difficult to assess on still frames reversibility  LVEF depressed   Given that both tests are abnormal, I would  recomm L heart cath to define anatomy    Discussed with patient    Risks/benefits explained.   Pt understands and agrees to proceed.    Signed, Dietrich Pates, MD  05/06/2022 3:52 PM    [facility] [address] [place], [place], [place]  [address] Phone: [phone]; Fax: [phone]

## 2022-05-09 ENCOUNTER — Ambulatory Visit: Payer: 59 | Admitting: Nurse Practitioner

## 2022-05-21 ENCOUNTER — Telehealth (HOSPITAL_COMMUNITY): Payer: Self-pay | Admitting: *Deleted

## 2022-05-21 ENCOUNTER — Telehealth (HOSPITAL_COMMUNITY): Payer: Self-pay

## 2022-05-21 NOTE — Telephone Encounter (Signed)
Patient given detailed instructions per Myocardial Perfusion Study Information Sheet for the test on 05/28/22 at 0815. Patient notified to arrive 15 minutes early and that it is imperative to arrive on time for appointment to keep from having the test rescheduled.  If you need to cancel or reschedule your appointment, please call the office within 24 hours of your appointment. . Patient verbalized understanding. TMY

## 2022-05-21 NOTE — Telephone Encounter (Signed)
Attempted to call patient regarding upcoming  test. No answer and unable to leave a message.  Michele Craig

## 2022-05-27 ENCOUNTER — Ambulatory Visit: Payer: Self-pay | Admitting: Nurse Practitioner

## 2022-05-27 DIAGNOSIS — Z0289 Encounter for other administrative examinations: Secondary | ICD-10-CM

## 2022-05-27 NOTE — Progress Notes (Deleted)
   [place] B Guercio 1971/02/15 [id number]   History:  51 y.o. [id number] presents as new patient to establish care. Mirena IUD 04/2021. Normal pap and mammogram history.  HTN, T2DM, RA.   Gynecologic History No LMP recorded. (Menstrual status: IUD).   Contraception/Family planning: IUD Sexually active: Yes  Health Maintenance Last Pap: 03/12/2021. Results were: Normal, 5-year repeat Last mammogram: 5 years ago per patient. Results were: normal Last colonoscopy: Never Last Dexa: Not indicated  Past medical history, past surgical history, family history and social history were all reviewed and documented in the EPIC chart. Married. 4 children ages 24-27.  ROS:  A ROS was performed and pertinent positives and negatives are included.  Exam:  There were no vitals filed for this visit.  There is no height or weight on file to calculate BMI.  General appearance:  Normal Thyroid:  Symmetrical, normal in size, without palpable masses or nodularity. Respiratory  Auscultation:  Clear without wheezing or rhonchi Cardiovascular  Auscultation:  Regular rate, without rubs, murmurs or gallops  Edema/varicosities:  Not grossly evident Abdominal  Soft,nontender, without masses, guarding or rebound.  Liver/spleen:  No organomegaly noted  Hernia:  None appreciated  Skin  Inspection:  Grossly normal Breasts: Examined lying and sitting.   Right: Without masses, retractions, nipple discharge or axillary adenopathy.   Left: Without masses, retractions, nipple discharge or axillary adenopathy. Gentitourinary   Inguinal/mons:  Normal without inguinal adenopathy  External genitalia:  Normal appearing vulva with no masses, tenderness, or lesions  BUS/Urethra/Skene's glands:  Normal  Vagina:  Normal appearing with normal color and discharge, no lesions  Cervix:  Normal appearing without discharge or lesions. IUD string not visible  Uterus:  Anteverted. Difficult to palpate due to body habitus but no  grosses masses or tenderness  Adnexa/parametria:     Rt: Normal in size, without masses or tenderness.   Lt: Normal in size, without masses or tenderness.  Anus and perineum: Normal  Digital rectal exam: Normal sphincter tone without palpated masses or tenderness  Patient informed chaperone available to be present for breast and pelvic exam. Patient has requested no chaperone to be present. Patient has been advised what will be completed during breast and pelvic exam.   Assessment/Plan:  51 y.o. [id number] for annual exam.     Screening for cervical cancer - Normal Pap history.  Will repeat at 5-year interval per guidelines.  Screening for breast cancer - Normal mammogram history. Overdue for mammogram. Information provided on [facility] and she will schedule this soon. Normal breast exam today.  Screening for colon cancer - Has not had screening colonoscopy. Information provided on [facility] and encouraged to schedule this soon.   Return in 1 year for annual.    Olivia Mackie DNP, 2:19 PM 05/27/2022

## 2022-05-28 ENCOUNTER — Ambulatory Visit (HOSPITAL_COMMUNITY): Payer: 59 | Attending: Cardiology

## 2022-05-28 ENCOUNTER — Ambulatory Visit (HOSPITAL_BASED_OUTPATIENT_CLINIC_OR_DEPARTMENT_OTHER): Payer: 59

## 2022-05-28 DIAGNOSIS — R9431 Abnormal electrocardiogram [ECG] [EKG]: Secondary | ICD-10-CM | POA: Diagnosis present

## 2022-05-28 LAB — ECHOCARDIOGRAM COMPLETE
Area-P 1/2: 12.44 cm2
S' Lateral: 3.3 cm

## 2022-05-28 MED ORDER — REGADENOSON 0.4 MG/5ML IV SOLN
0.4000 mg | Freq: Once | INTRAVENOUS | Status: AC
  Administered 2022-05-28: 0.4 mg via INTRAVENOUS

## 2022-05-28 MED ORDER — TECHNETIUM TC 99M TETROFOSMIN IV KIT
30.2000 | PACK | Freq: Once | INTRAVENOUS | Status: AC | PRN
  Administered 2022-05-28: 30.2 via INTRAVENOUS

## 2022-05-29 ENCOUNTER — Ambulatory Visit (HOSPITAL_COMMUNITY): Payer: 59 | Attending: Internal Medicine

## 2022-05-29 LAB — MYOCARDIAL PERFUSION IMAGING
LV dias vol: 134 mL (ref 46–106)
LV sys vol: 85 mL
Nuc Stress EF: 36 %
Peak HR: 96 {beats}/min
Rest HR: 77 {beats}/min
Rest Nuclear Isotope Dose: 33 mCi
SDS: 3
SRS: 2
SSS: 5
ST Depression (mm): 0 mm
Stress Nuclear Isotope Dose: 30.2 mCi
TID: 0.91

## 2022-05-29 MED ORDER — TECHNETIUM TC 99M TETROFOSMIN IV KIT
33.0000 | PACK | Freq: Once | INTRAVENOUS | Status: AC | PRN
  Administered 2022-05-29: 33 via INTRAVENOUS

## 2022-06-01 ENCOUNTER — Emergency Department (HOSPITAL_COMMUNITY)
Admission: EM | Admit: 2022-06-01 | Discharge: 2022-06-01 | Disposition: A | Discharge status: Home / Self Care | Payer: 59 | Attending: Emergency Medicine | Admitting: Emergency Medicine

## 2022-06-01 ENCOUNTER — Encounter (HOSPITAL_COMMUNITY): Payer: Self-pay

## 2022-06-01 ENCOUNTER — Other Ambulatory Visit: Payer: Self-pay

## 2022-06-01 DIAGNOSIS — E119 Type 2 diabetes mellitus without complications: Secondary | ICD-10-CM | POA: Diagnosis not present

## 2022-06-01 DIAGNOSIS — R300 Dysuria: Secondary | ICD-10-CM | POA: Diagnosis present

## 2022-06-01 DIAGNOSIS — R1032 Left lower quadrant pain: Secondary | ICD-10-CM | POA: Diagnosis not present

## 2022-06-01 DIAGNOSIS — N3000 Acute cystitis without hematuria: Secondary | ICD-10-CM

## 2022-06-01 LAB — URINALYSIS, ROUTINE W REFLEX MICROSCOPIC
Bilirubin Urine: NEGATIVE
Glucose, UA: NEGATIVE mg/dL
Hgb urine dipstick: NEGATIVE
Ketones, ur: NEGATIVE mg/dL
Nitrite: NEGATIVE
Protein, ur: 100 mg/dL — AB
Specific Gravity, Urine: 1.017 (ref 1.005–1.030)
WBC, UA: >50 WBC/hpf — ABNORMAL HIGH (ref 0–5)
pH: 5 (ref 5.0–8.0)

## 2022-06-01 MED ORDER — CEFADROXIL 500 MG PO CAPS: 500.0000 mg | ORAL_CAPSULE | Freq: Two times a day (BID) | ORAL | 0 refills | Status: DC

## 2022-06-01 MED ORDER — FLUCONAZOLE 150 MG PO TABS: 150.0000 mg | ORAL_TABLET | Freq: Every day | ORAL | 0 refills | Status: DC

## 2022-06-01 MED ORDER — ACETAMINOPHEN 325 MG PO TABS
650.0000 mg | ORAL_TABLET | Freq: Once | ORAL | Status: AC
  Administered 2022-06-01: 650 mg via ORAL
  Filled 2022-06-01: qty 2

## 2022-06-01 NOTE — ED Triage Notes (Signed)
Patient c/o dysuria , left low back pain, and frequency x 2 days.

## 2022-06-01 NOTE — ED Provider Notes (Signed)
[facility]-EMERGENCY DEPT Provider Note   CSN: [id number] Arrival date & time: 06/01/22  0735     History  Chief Complaint  Patient presents with   Dysuria    Michele Craig is a 51 y.o. female with past medical history significant for obesity, hypertension, diabetes who presents with concern for dysuria, left lower back.,  As well as urinary frequency for the last 2 days.  Patient denies any fever, chills, nausea, vomiting.  She has not taken anything for the pain prior to arrival.  Patient does endorse some slightly increased vaginal discharge associated with her symptoms.  She denies any vaginal bleeding, dyspareunia.  She denies any new sexual partners.  She denies any foul smell to the discharge.  Denies significant urinary or pelvic pain at rest.   Dysuria      Home Medications Prior to Admission medications   Medication Sig Start Date End Date Taking? Authorizing Provider  cefadroxil (DURICEF) 500 MG capsule Take 1 capsule (500 mg total) by mouth 2 (two) times daily. 06/01/22  Yes Waverley Krempasky H, PA-C  fluconazole (DIFLUCAN) 150 MG tablet Take 1 tablet (150 mg total) by mouth daily. Take 1 tablet at first sign of symptoms, you can take second tablet in 7 days if you continue to have yeast infection symptoms 06/01/22  Yes Kailani Brass H, PA-C  ADVAIR DISKUS 250-50 MCG/DOSE AEPB Inhale 1 puff into the lungs 2 (two) times daily.  05/17/14   [provider]  ARIPiprazole (ABILIFY) 10 MG tablet Take 10 mg by mouth every morning. 04/18/21   [provider]  buPROPion (WELLBUTRIN XL) 150 MG 24 hr tablet Take 300 mg by mouth daily. 05/17/18   [provider]  CIMZIA PREFILLED 2 X 200 MG/ML PSKT Inject into the skin. 04/22/21   [provider]  ciprofloxacin (CIPRO) 500 MG tablet Take 500 mg by mouth 2 (two) times daily. 03/27/22   [provider]  clonazePAM (KLONOPIN) 2 MG tablet Take 2 mg by mouth at bedtime.  03/03/21   [provider]  DULoxetine (CYMBALTA) 60 MG capsule Take 60 mg by mouth daily. 03/20/22   [provider]  fluticasone (FLONASE) 50 MCG/ACT nasal spray Place 1 spray into both nostrils daily as needed for allergies. 04/17/21   [provider]  fluticasone (FLOVENT HFA) 44 MCG/ACT inhaler Inhale 1 puff into the lungs daily as needed (for shortness of breath).    [provider]  gabapentin (NEURONTIN) 100 MG capsule 400 mg 2 (two) times daily. 07/07/21   [provider]  hydroxychloroquine (PLAQUENIL) 200 MG tablet Take 200 mg by mouth 2 (two) times daily. 04/27/21   [provider]  ipratropium (ATROVENT) 0.03 % nasal spray Place 2 sprays into both nostrils every 12 (twelve) hours. 02/24/22   Martina Sinner, MD  levalbuterol Pauline Aus) 0.63 MG/3ML nebulizer solution Take 3 mLs (0.63 mg total) by nebulization every 6 (six) hours as needed for wheezing or shortness of breath. 01/12/22   Meredeth Ide, MD  levocetirizine (XYZAL) 5 MG tablet Take 5 mg by mouth every evening. 04/30/21   [provider]  levonorgestrel (MIRENA, 52 MG,) 20 MCG/DAY IUD Mirena 20 mcg/24 hours (7 yrs) 52 mg intrauterine device  Take 1 device by intrauterine route.    [provider]  montelukast (SINGULAIR) 10 MG tablet Take 10 mg by mouth at bedtime.    [provider]  omeprazole (PRILOSEC) 20 MG capsule Take 20 mg by  mouth every evening. 07/05/21   [provider]  ondansetron (ZOFRAN-ODT) 8 MG disintegrating tablet Take 1 tablet (8 mg total) by mouth every 8 (eight) hours as needed for nausea or vomiting. 03/30/22   Theron Arista, PA-C  phentermine (ADIPEX-P) 37.5 MG tablet Take 37.5 mg by mouth every morning. 06/25/21   [provider]  SUMAtriptan (IMITREX) 100 MG tablet Take 100 mg by mouth every 2 (two) hours as needed for migraine. 11/28/21   [provider]  tamsulosin (FLOMAX) 0.4 MG CAPS capsule Take 1 capsule  (0.4 mg total) by mouth daily after supper. 03/30/22   Theron Arista, PA-C  topiramate (TOPAMAX) 50 MG tablet Take 50 mg by mouth daily. 01/29/21   [provider]  traZODone (DESYREL) 100 MG tablet Take 100 mg by mouth at bedtime. 04/18/21   [provider]      Allergies    Shrimp [shellfish allergy], Penicillins, Zosyn [piperacillin sod-tazobactam so], and Clindamycin    Review of Systems   Review of Systems  Genitourinary:  Positive for dysuria.  All other systems reviewed and are negative.   Physical Exam Updated Vital Signs BP 110/81   Pulse 81   Temp 98.5 F (36.9 C) (Oral)   Resp 16   Ht 5' 7.5" (1.715 m)   Wt (!) 146.5 kg   SpO2 95%   BMI 49.84 kg/m  Physical Exam Vitals and nursing note reviewed.  Constitutional:      General: She is not in acute distress.    Appearance: Normal appearance.  HENT:     Head: Normocephalic and atraumatic.  Eyes:     General:        Right eye: No discharge.        Left eye: No discharge.  Cardiovascular:     Rate and Rhythm: Normal rate and regular rhythm.     Heart sounds: No murmur heard.    No friction rub. No gallop.  Pulmonary:     Effort: Pulmonary effort is normal.     Breath sounds: Normal breath sounds.  Abdominal:     General: Bowel sounds are normal.     Palpations: Abdomen is soft.     Comments: Some minimal tenderness palpation suprapubically and in the left lower quadrant.  No rebound, rigidity, guarding.  Skin:    General: Skin is warm and dry.     Capillary Refill: Capillary refill takes less than 2 seconds.  Neurological:     Mental Status: She is alert and oriented to person, place, and time.  Psychiatric:        Mood and Affect: Mood normal.        Behavior: Behavior normal.     ED Results / Procedures / Treatments   Labs (all labs ordered are listed, but only abnormal results are displayed) Labs Reviewed  URINALYSIS, ROUTINE W REFLEX MICROSCOPIC - Abnormal; Notable for the following  components:      Result Value   Color, Urine AMBER (*)    APPearance CLOUDY (*)    Protein, ur 100 (*)    Leukocytes,Ua LARGE (*)    WBC, UA >50 (*)    Bacteria, UA MANY (*)    Non Squamous Epithelial 0-5 (*)    All other components within normal limits    EKG None  Radiology No results found.  Procedures Procedures    Medications Ordered in ED Medications  acetaminophen (TYLENOL) tablet 650 mg (650 mg Oral Given 06/01/22 0821)  ED Course/ Medical Decision Making/ A&P                           Medical Decision Making Amount and/or Complexity of Data Reviewed Labs: ordered.   Patient with presentation, signs and symptoms that seem most suspicious for urinary tract infection, also considered left-sided diverticulitis, PID, tubo-ovarian abscess, BV, trichomoniasis, or other STI presentation, progression to pyelonephritis versus other.  We will obtain a urinalysis.  Discussed with patient and performed shared decision making, she declines pelvic exam until results of urinalysis are back she thinks it is most likely she has UTI.  I independently interpreted urinalysis which showed leukocytes, greater than 50 white blood cells and bacteria.  There are white blood cell clumps.  This urinalysis is consistent with acute urinary tract infection, acute cystitis.  Patient is afebrile and without significant flank pain on my exam, do not think that she needs any IV treatment for pyelonephritis at this time.  Discussed that again I am happy to perform a pelvic exam she is concerned about her vaginal discharge, however do think that this urine does explain the pain and symptoms that she is having.  Patient again declines pelvic exam in emergency department today, but is encouraged to follow-up as needed.  Patient discharged with cefadroxil, as well as Diflucan as she reports high incidence of yeast infection after antibiotic treatment for urinary tract infection.  Encourage follow-up with  PCP for recheck of symptoms.  Encouraged ibuprofen and Tylenol for pain, as well as OTC AZO. Final Clinical Impression(s) / ED Diagnoses Final diagnoses:  Acute cystitis without hematuria    Rx / DC Orders ED Discharge Orders          Ordered    cefadroxil (DURICEF) 500 MG capsule  2 times daily        06/01/22 0846    fluconazole (DIFLUCAN) 150 MG tablet  Daily        06/01/22 0848              Chizuko Trine, Harrel Carina, PA-C 06/01/22 3462    Mancel Bale, MD 06/01/22 1750

## 2022-06-01 NOTE — Discharge Instructions (Signed)
Please use Tylenol or ibuprofen for pain.  You may use 600 mg ibuprofen every 6 hours or 1000 mg of Tylenol every 6 hours.  You may choose to alternate between the 2.  This would be most effective.  Not to exceed 4 g of Tylenol within 24 hours.  Not to exceed 3200 mg ibuprofen 24 hours.  You can use over-the-counter AZO to help with symptoms until the antibiotics kick in.  If you have any concerns for worsening symptoms please return for further evaluation or follow-up with your primary care or OB/GYN doctor.

## 2022-06-02 ENCOUNTER — Ambulatory Visit: Payer: 59 | Admitting: Pulmonary Disease

## 2022-06-04 ENCOUNTER — Telehealth: Payer: Self-pay | Admitting: *Deleted

## 2022-06-04 NOTE — H&P (Signed)
Cath because of abnormal imaging and LBBB, QRSD 148 msec. Cath ordered to r/o CAD Likely has WMA and perfusion defect related to LBBB.

## 2022-06-04 NOTE — Telephone Encounter (Signed)
Cardiac Catheterization scheduled at [facility] for: Thursday June 05, 2022 1:30 PM Arrival time and place: [facility] Main Entrance A at: 11:30 AM   Nothing to eat after midnight prior to procedure, clear liquids until 5 AM day of procedure  Medication instructions: -Usual morning medications can be taken with sips of water including aspirin 81 mg.  Confirmed patient has responsible adult to drive home post procedure and be with patient first 24 hours after arriving home.  Patient reports no new symptoms concerning for COVID-19 in the past 10 days.  Reviewed procedure instructions with patient.

## 2022-06-05 ENCOUNTER — Ambulatory Visit (HOSPITAL_COMMUNITY)
Admission: RE | Admit: 2022-06-05 | Discharge: 2022-06-05 | Disposition: A | Discharge status: Home / Self Care | Payer: 59 | Attending: Interventional Cardiology | Admitting: Interventional Cardiology

## 2022-06-05 ENCOUNTER — Other Ambulatory Visit: Payer: Self-pay

## 2022-06-05 ENCOUNTER — Encounter (HOSPITAL_COMMUNITY)
Admission: RE | Disposition: A | Discharge status: Home / Self Care | Payer: Self-pay | Source: Home / Self Care | Attending: Interventional Cardiology

## 2022-06-05 DIAGNOSIS — R931 Abnormal findings on diagnostic imaging of heart and coronary circulation: Secondary | ICD-10-CM

## 2022-06-05 DIAGNOSIS — Z6841 Body Mass Index (BMI) 40.0 and over, adult: Secondary | ICD-10-CM | POA: Diagnosis not present

## 2022-06-05 DIAGNOSIS — R9439 Abnormal result of other cardiovascular function study: Secondary | ICD-10-CM | POA: Insufficient documentation

## 2022-06-05 DIAGNOSIS — R079 Chest pain, unspecified: Secondary | ICD-10-CM | POA: Insufficient documentation

## 2022-06-05 DIAGNOSIS — G4733 Obstructive sleep apnea (adult) (pediatric): Secondary | ICD-10-CM | POA: Diagnosis present

## 2022-06-05 DIAGNOSIS — I447 Left bundle-branch block, unspecified: Secondary | ICD-10-CM | POA: Insufficient documentation

## 2022-06-05 DIAGNOSIS — I10 Essential (primary) hypertension: Secondary | ICD-10-CM | POA: Diagnosis not present

## 2022-06-05 DIAGNOSIS — E662 Morbid (severe) obesity with alveolar hypoventilation: Secondary | ICD-10-CM | POA: Diagnosis present

## 2022-06-05 HISTORY — PX: LEFT HEART CATH AND CORONARY ANGIOGRAPHY: CATH118249

## 2022-06-05 LAB — PREGNANCY, URINE: Preg Test, Ur: NEGATIVE

## 2022-06-05 SURGERY — LEFT HEART CATH AND CORONARY ANGIOGRAPHY
Anesthesia: LOCAL

## 2022-06-05 MED ORDER — HEPARIN SODIUM (PORCINE) 1000 UNIT/ML IJ SOLN
INTRAMUSCULAR | Status: AC
  Filled 2022-06-05: qty 10

## 2022-06-05 MED ORDER — SODIUM CHLORIDE 0.9% FLUSH: 3.0000 mL | INTRAVENOUS | Status: DC | PRN

## 2022-06-05 MED ORDER — SODIUM CHLORIDE 0.9 % IV SOLN: INTRAVENOUS | Status: DC

## 2022-06-05 MED ORDER — HEPARIN (PORCINE) IN NACL 1000-0.9 UT/500ML-% IV SOLN
INTRAVENOUS | Status: AC
  Filled 2022-06-05: qty 1000

## 2022-06-05 MED ORDER — SODIUM CHLORIDE 0.9 % IV SOLN: 250.0000 mL | INTRAVENOUS | Status: DC | PRN

## 2022-06-05 MED ORDER — HEPARIN (PORCINE) IN NACL 1000-0.9 UT/500ML-% IV SOLN
INTRAVENOUS | Status: DC | PRN
  Administered 2022-06-05 (×2): 500 mL

## 2022-06-05 MED ORDER — VERAPAMIL HCL 2.5 MG/ML IV SOLN
INTRAVENOUS | Status: DC | PRN
  Administered 2022-06-05: 10 mL via INTRA_ARTERIAL

## 2022-06-05 MED ORDER — FENTANYL CITRATE (PF) 100 MCG/2ML IJ SOLN
INTRAMUSCULAR | Status: AC
  Filled 2022-06-05: qty 2

## 2022-06-05 MED ORDER — HEPARIN SODIUM (PORCINE) 1000 UNIT/ML IJ SOLN
INTRAMUSCULAR | Status: DC | PRN
  Administered 2022-06-05: 6500 [IU] via INTRAVENOUS

## 2022-06-05 MED ORDER — SODIUM CHLORIDE 0.9% FLUSH
3.0000 mL | Freq: Two times a day (BID) | INTRAVENOUS | Status: DC
Start: 2022-06-05 — End: 2022-06-05

## 2022-06-05 MED ORDER — LABETALOL HCL 5 MG/ML IV SOLN: 10.0000 mg | INTRAVENOUS | Status: DC | PRN

## 2022-06-05 MED ORDER — ACETAMINOPHEN 325 MG PO TABS
650.0000 mg | ORAL_TABLET | ORAL | Status: DC | PRN
Start: 2022-06-05 — End: 2022-06-05

## 2022-06-05 MED ORDER — SODIUM CHLORIDE 0.9 % WEIGHT BASED INFUSION
3.0000 mL/kg/h | INTRAVENOUS | Status: AC
  Administered 2022-06-05: 3 mL/kg/h via INTRAVENOUS

## 2022-06-05 MED ORDER — MIDAZOLAM HCL 2 MG/2ML IJ SOLN
INTRAMUSCULAR | Status: DC | PRN
  Administered 2022-06-05: 2 mg via INTRAVENOUS
  Administered 2022-06-05: 1 mg via INTRAVENOUS

## 2022-06-05 MED ORDER — ASPIRIN 81 MG PO CHEW
81.0000 mg | CHEWABLE_TABLET | ORAL | Status: AC
  Administered 2022-06-05: 81 mg via ORAL
  Filled 2022-06-05: qty 1

## 2022-06-05 MED ORDER — VERAPAMIL HCL 2.5 MG/ML IV SOLN
INTRAVENOUS | Status: AC
  Filled 2022-06-05: qty 2

## 2022-06-05 MED ORDER — IOHEXOL 350 MG/ML SOLN
INTRAVENOUS | Status: DC | PRN
  Administered 2022-06-05: 45 mL

## 2022-06-05 MED ORDER — HYDRALAZINE HCL 20 MG/ML IJ SOLN: 10.0000 mg | INTRAMUSCULAR | Status: DC | PRN

## 2022-06-05 MED ORDER — LIDOCAINE HCL (PF) 1 % IJ SOLN
INTRAMUSCULAR | Status: DC | PRN
  Administered 2022-06-05: 2 mL

## 2022-06-05 MED ORDER — SODIUM CHLORIDE 0.9 % WEIGHT BASED INFUSION: 1.0000 mL/kg/h | INTRAVENOUS | Status: DC

## 2022-06-05 MED ORDER — FENTANYL CITRATE (PF) 100 MCG/2ML IJ SOLN
INTRAMUSCULAR | Status: DC | PRN
  Administered 2022-06-05: 25 ug via INTRAVENOUS

## 2022-06-05 MED ORDER — ONDANSETRON HCL 4 MG/2ML IJ SOLN: 4.0000 mg | Freq: Four times a day (QID) | INTRAMUSCULAR | Status: DC | PRN

## 2022-06-05 MED ORDER — LIDOCAINE HCL (PF) 1 % IJ SOLN
INTRAMUSCULAR | Status: AC
  Filled 2022-06-05: qty 30

## 2022-06-05 MED ORDER — MIDAZOLAM HCL 2 MG/2ML IJ SOLN
INTRAMUSCULAR | Status: AC
  Filled 2022-06-05: qty 2

## 2022-06-05 SURGICAL SUPPLY — 13 items
BAND CMPR LRG ZPHR (HEMOSTASIS) ×1
BAND ZEPHYR COMPRESS 30 LONG (HEMOSTASIS) ×1 IMPLANT
CATH 5FR JL3.5 JR4 ANG PIG MP (CATHETERS) ×1 IMPLANT
GLIDESHEATH SLEND A-KIT 6F 22G (SHEATH) ×1 IMPLANT
GUIDEWIRE INQWIRE 1.5J.035X260 (WIRE) IMPLANT
INQWIRE 1.5J .035X260CM (WIRE) ×2
KIT HEART LEFT (KITS) ×2 IMPLANT
PACK CARDIAC CATHETERIZATION (CUSTOM PROCEDURE TRAY) ×2 IMPLANT
PROTECTION STATION PRESSURIZED (MISCELLANEOUS) ×2
SHEATH PROBE COVER 6X72 (BAG) ×1 IMPLANT
STATION PROTECTION PRESSURIZED (MISCELLANEOUS) IMPLANT
TRANSDUCER W/STOPCOCK (MISCELLANEOUS) ×2 IMPLANT
TUBING CIL FLEX 10 FLL-RA (TUBING) ×2 IMPLANT

## 2022-06-05 NOTE — Interval H&P Note (Signed)
Cath Lab Visit (complete for each Cath Lab visit)  Clinical Evaluation Leading to the Procedure:   ACS: No.  Non-ACS:    Anginal Classification: No Symptoms  Anti-ischemic medical therapy: No Therapy  Non-Invasive Test Results: No non-invasive testing performed  Prior CABG: No previous CABG      History and Physical Interval Note:  06/05/2022 2:28 PM  Michele Craig  has presented today for surgery, with the diagnosis of chest pain - abnormal stress test.  The various methods of treatment have been discussed with the patient and family. After consideration of risks, benefits and other options for treatment, the patient has consented to  Procedure(s): LEFT HEART CATH AND CORONARY ANGIOGRAPHY (N/A) as a surgical intervention.  The patient's history has been reviewed, patient examined, no change in status, stable for surgery.  I have reviewed the patient's chart and labs.  Questions were answered to the patient's satisfaction.     Lyn Records III

## 2022-06-05 NOTE — Discharge Instructions (Signed)

## 2022-06-06 ENCOUNTER — Encounter (HOSPITAL_COMMUNITY): Payer: Self-pay | Admitting: Interventional Cardiology

## 2022-07-08 ENCOUNTER — Ambulatory Visit: Payer: 59 | Admitting: Pulmonary Disease

## 2022-07-08 NOTE — Progress Notes (Deleted)
Synopsis: Referred in April 2023 for asthma by Marva Panda, NP  Subjective:   PATIENT ID: Michele Craig GENDER: female DOB: Jun 15, 1971, MRN: [id number]  HPI  No chief complaint on file.  Michele Deangelo is a 51 year old woman, never smoker with DMII, rheumatoid arthritis, GERD, OSA not on CPAP, and hypertension who returns to pulmonary clinic for asthma.   PFTs today show:  Advair 250-33mcg 1 puff twice daily. Started on ipratropium nasal spray for post-nasal drainage?  Initial OV 02/24/22 She was treated for asthma exacerbation 3/2 to 3/5. She had influenza A in 09/2021 where she was treated in the ER.   She reports having wheezing a daily basis prior to her hospitalization and increasing dyspnea along with trouble sleeping. She was started on advair inhaler 250-57mcg 1 puff twice daily after her recent hospitalization. She reports only needing xopenex inhaler before bedtime for wheezing. She is currently taking Xyzal and singulair for allergies. She does report sinus congestion and post nasal drainage.  Her asthma triggers include spring allergies, cold air, cigarette smoke, pets and strong perfumes/colognes.   She reports having asthma since childhood. She was a premature baby. She was exposed to second hand smoke from her father. She is a never smoker. She denies dust of chemical exposures in the past. She has a pet dog at home.   Past Medical History:  Diagnosis Date   Anemia    Anxiety    Arthritis    Complication of anesthesia    with carpal tunnel syndrome the block didn't work well, she could feel things during the surgery   Depression    Family history of adverse reaction to anesthesia    unknown, pt is adopted   Fibromyalgia    Foot pain    GERD (gastroesophageal reflux disease)    Headache    Hypertension    IBS (irritable bowel syndrome)    after cholecystectomy and then it went away after Gastric sleeve   Moderate persistent asthma    OSA (obstructive  sleep apnea)    does not use cpap   RA (rheumatoid arthritis) (HCC)    Shortness of breath dyspnea    only when her asthma is flaring up   Type 2 diabetes mellitus (HCC)    pt states after her gastric sleeve procedure she is no longer on medications and is not checked for it     Family History  Adopted: Yes  Family history unknown: Yes     Social History   Socioeconomic History   Marital status: Married    Spouse name: Not on file   Number of children: Not on file   Years of education: Not on file   Highest education level: Not on file  Occupational History   Not on file  Tobacco Use   Smoking status: Never   Smokeless tobacco: Never  Vaping Use   Vaping Use: Never used  Substance and Sexual Activity   Alcohol use: Not Currently   Drug use: No   Sexual activity: Yes    Birth control/protection: I.U.D.    Comment: 1st intercourse 48 yo-5 partners  Other Topics Concern   Not on file  Social History Narrative   Not on file   Social Determinants of Health   Financial Resource Strain: Not on file  Food Insecurity: Not on file  Transportation Needs: Not on file  Physical Activity: Not on file  Stress: Not on file  Social Connections: Not on file  Intimate Partner Violence: Not on file     Allergies  Allergen Reactions   Shrimp [Shellfish Allergy] Anaphylaxis    Crawdads    Duricef [Cefadroxil] Diarrhea, Rash and Other (See Comments)    Abdominal pain   Penicillins Hives    Has patient had a PCN reaction causing immediate rash, facial/tongue/throat swelling, SOB or lightheadedness with hypotension: No Has patient had a PCN reaction causing severe rash involving mucus membranes or skin necrosis: No Has patient had a PCN reaction that required hospitalization No Has patient had a PCN reaction occurring within the last 10 years: No If all of the above answers are "NO", then may proceed with Cephalosporin use.   Zosyn [Piperacillin Sod-Tazobactam So] Hives and  Itching   Clindamycin Itching and Nausea Only    Stomach pains     Outpatient Medications Prior to Visit  Medication Sig Dispense Refill   ADVAIR DISKUS 250-50 MCG/DOSE AEPB Inhale 1 puff into the lungs 2 (two) times daily.      ARIPiprazole (ABILIFY) 10 MG tablet Take 10 mg by mouth every morning.     buPROPion (WELLBUTRIN XL) 150 MG 24 hr tablet Take 450 mg by mouth daily.     cefadroxil (DURICEF) 500 MG capsule Take 1 capsule (500 mg total) by mouth 2 (two) times daily. 14 capsule 0   CIMZIA PREFILLED 2 X 200 MG/ML PSKT Inject 200 mg into the skin every 14 (fourteen) days.     clonazePAM (KLONOPIN) 2 MG tablet Take 2 mg by mouth at bedtime.     DULoxetine (CYMBALTA) 60 MG capsule Take 60 mg by mouth daily.     fluconazole (DIFLUCAN) 150 MG tablet Take 1 tablet (150 mg total) by mouth daily. Take 1 tablet at first sign of symptoms, you can take second tablet in 7 days if you continue to have yeast infection symptoms (Patient not taking: Reported on 06/04/2022) 2 tablet 0   fluticasone (FLONASE) 50 MCG/ACT nasal spray Place 1 spray into both nostrils daily as needed for allergies.     fluticasone (FLOVENT HFA) 44 MCG/ACT inhaler Inhale 1 puff into the lungs daily as needed (for shortness of breath).     gabapentin (NEURONTIN) 100 MG capsule 400 mg 2 (two) times daily.     hydroxychloroquine (PLAQUENIL) 200 MG tablet Take 200 mg by mouth 2 (two) times daily.     ipratropium (ATROVENT) 0.03 % nasal spray Place 2 sprays into both nostrils every 12 (twelve) hours. (Patient not taking: Reported on 06/04/2022) 30 mL 12   levalbuterol (XOPENEX HFA) 45 MCG/ACT inhaler Inhale 2 puffs into the lungs 3 (three) times daily as needed for shortness of breath.     levalbuterol (XOPENEX) 0.63 MG/3ML nebulizer solution Take 3 mLs (0.63 mg total) by nebulization every 6 (six) hours as needed for wheezing or shortness of breath. 30 mL 5   levocetirizine (XYZAL) 5 MG tablet Take 5 mg by mouth every evening.      levonorgestrel (MIRENA, 52 MG,) 20 MCG/DAY IUD Mirena 20 mcg/24 hours (7 yrs) 52 mg intrauterine device  Take 1 device by intrauterine route.     montelukast (SINGULAIR) 10 MG tablet Take 10 mg by mouth at bedtime.     omeprazole (PRILOSEC) 20 MG capsule Take 20 mg by mouth every evening.     ondansetron (ZOFRAN-ODT) 8 MG disintegrating tablet Take 1 tablet (8 mg total) by mouth every 8 (eight) hours as needed for nausea or vomiting. 20 tablet 0   SUMAtriptan (  IMITREX) 100 MG tablet Take 100 mg by mouth every 2 (two) hours as needed for migraine.     SYMBICORT 160-4.5 MCG/ACT inhaler Inhale 2 puffs into the lungs daily.     tamsulosin (FLOMAX) 0.4 MG CAPS capsule Take 1 capsule (0.4 mg total) by mouth daily after supper. (Patient not taking: Reported on 06/04/2022) 30 capsule 0   topiramate (TOPAMAX) 50 MG tablet Take 50 mg by mouth daily.     traZODone (DESYREL) 100 MG tablet Take 100 mg by mouth at bedtime.     Vitamin D, Ergocalciferol, (DRISDOL) 1.25 MG (50000 UNIT) CAPS capsule Take 50,000 Units by mouth every Sunday.     No facility-administered medications prior to visit.   Review of Systems  Constitutional:  Negative for chills, fever, malaise/fatigue and weight loss.  HENT:  Negative for congestion, sinus pain and sore throat.   Eyes: Negative.   Respiratory:  Positive for shortness of breath and wheezing. Negative for cough, hemoptysis and sputum production.   Cardiovascular:  Negative for chest pain, palpitations, orthopnea, claudication and leg swelling.  Gastrointestinal:  Negative for abdominal pain, heartburn, nausea and vomiting.  Genitourinary: Negative.   Musculoskeletal:  Negative for joint pain and myalgias.  Skin:  Negative for rash.  Neurological:  Negative for weakness.  Endo/Heme/Allergies:  Positive for environmental allergies.  Psychiatric/Behavioral: Negative.      Objective:   There were no vitals filed for this visit.   Physical Exam Constitutional:       General: She is not in acute distress.    Appearance: She is obese. She is not ill-appearing.  HENT:     Head: Normocephalic and atraumatic.  Eyes:     General: No scleral icterus.    Conjunctiva/sclera: Conjunctivae normal.     Pupils: Pupils are equal, round, and reactive to light.  Cardiovascular:     Rate and Rhythm: Normal rate and regular rhythm.     Pulses: Normal pulses.     Heart sounds: Normal heart sounds. No murmur heard. Pulmonary:     Effort: Pulmonary effort is normal.     Breath sounds: Normal breath sounds. No wheezing, rhonchi or rales.  Abdominal:     General: Bowel sounds are normal.     Palpations: Abdomen is soft.  Musculoskeletal:     Right lower leg: No edema.     Left lower leg: No edema.  Lymphadenopathy:     Cervical: No cervical adenopathy.  Skin:    General: Skin is warm and dry.  Neurological:     General: No focal deficit present.     Mental Status: She is alert.  Psychiatric:        Mood and Affect: Mood normal.        Behavior: Behavior normal.        Thought Content: Thought content normal.        Judgment: Judgment normal.    CBC    Component Value Date/Time   WBC 6.1 03/30/2022 1030   RBC 4.16 03/30/2022 1030   HGB 12.7 03/30/2022 1030   HCT 37.8 03/30/2022 1030   PLT 293 03/30/2022 1030   MCV 90.9 03/30/2022 1030   MCH 30.5 03/30/2022 1030   MCHC 33.6 03/30/2022 1030   RDW 13.2 03/30/2022 1030   LYMPHSABS 2.6 03/30/2022 1030   MONOABS 0.6 03/30/2022 1030   EOSABS 0.1 03/30/2022 1030   BASOSABS 0.0 03/30/2022 1030      Latest Ref Rng & Units 03/30/2022   10:30  AM 01/10/2022    4:26 AM 01/09/2022    1:00 AM  BMP  Glucose 70 - 99 mg/dL 87  676  720   BUN 6 - 20 mg/dL 9  11  12    Creatinine 0.44 - 1.00 mg/dL  9.47  0.96   Sodium 135 - 145 mmol/L 142  137  135   Potassium 3.5 - 5.1 mmol/L 3.4  3.5  3.8   Chloride 98 - 111 mmol/L 115  108  109   CO2 22 - 32 mmol/L 24  22  21    Calcium 8.9 - 10.3 mg/dL 8.3  8.6  8.4     Chest imaging: CXR 01/09/22 The heart size and mediastinal contours are within normal limits. Both lungs are clear. The visualized skeletal structures are unremarkable.  PFT:     No data to display           Labs:  Path:  Echo:  Heart Catheterization:  Assessment & Plan:   No diagnosis found.  Discussion: Grandberry is a 45 year old woman, never smoker with DMII, rheumatoid arthritis, GERD, OSA not on CPAP, and hypertension who is referred to pulmonary clinic for asthma.   She appears to have moderate persistent asthma that is well controlled at this time with advair 250-4mcg 1 puff twice daily and as needed xopenex.   She is to continue Xyzal and singulair for her allergies along with fluticasone nasal spray, 2 sprays per nostril daily. We will add ipratropium nasal spray, 2 sprays per nostril twice daily for post-nasal drainage.   Follow up in 3 months with pulmonary function tests.  44, MD [facility] Office: [phone]   Current Outpatient Medications:    ADVAIR DISKUS 250-50 MCG/DOSE AEPB, Inhale 1 puff into the lungs 2 (two) times daily. , Disp: , Rfl:    ARIPiprazole (ABILIFY) 10 MG tablet, Take 10 mg by mouth every morning., Disp: , Rfl:    buPROPion (WELLBUTRIN XL) 150 MG 24 hr tablet, Take 450 mg by mouth daily., Disp: , Rfl:    cefadroxil (DURICEF) 500 MG capsule, Take 1 capsule (500 mg total) by mouth 2 (two) times daily., Disp: 14 capsule, Rfl: 0   CIMZIA PREFILLED 2 X 200 MG/ML PSKT, Inject 200 mg into the skin every 14 (fourteen) days., Disp: , Rfl:    clonazePAM (KLONOPIN) 2 MG tablet, Take 2 mg by mouth at bedtime., Disp: , Rfl:    DULoxetine (CYMBALTA) 60 MG capsule, Take 60 mg by mouth daily., Disp: , Rfl:    fluconazole (DIFLUCAN) 150 MG tablet, Take 1 tablet (150 mg total) by mouth daily. Take 1 tablet at first sign of symptoms, you can take second tablet in 7 days if you continue to have yeast infection  symptoms (Patient not taking: Reported on 06/04/2022), Disp: 2 tablet, Rfl: 0   fluticasone (FLONASE) 50 MCG/ACT nasal spray, Place 1 spray into both nostrils daily as needed for allergies., Disp: , Rfl:    fluticasone (FLOVENT HFA) 44 MCG/ACT inhaler, Inhale 1 puff into the lungs daily as needed (for shortness of breath)., Disp: , Rfl:    gabapentin (NEURONTIN) 100 MG capsule, 400 mg 2 (two) times daily., Disp: , Rfl:    hydroxychloroquine (PLAQUENIL) 200 MG tablet, Take 200 mg by mouth 2 (two) times daily., Disp: , Rfl:    ipratropium (ATROVENT) 0.03 % nasal spray, Place 2 sprays into both nostrils every 12 (twelve) hours. (Patient not taking: Reported on  06/04/2022), Disp: 30 mL, Rfl: 12   levalbuterol (XOPENEX HFA) 45 MCG/ACT inhaler, Inhale 2 puffs into the lungs 3 (three) times daily as needed for shortness of breath., Disp: , Rfl:    levalbuterol (XOPENEX) 0.63 MG/3ML nebulizer solution, Take 3 mLs (0.63 mg total) by nebulization every 6 (six) hours as needed for wheezing or shortness of breath., Disp: 30 mL, Rfl: 5   levocetirizine (XYZAL) 5 MG tablet, Take 5 mg by mouth every evening., Disp: , Rfl:    levonorgestrel (MIRENA, 52 MG,) 20 MCG/DAY IUD, Mirena 20 mcg/24 hours (7 yrs) 52 mg intrauterine device  Take 1 device by intrauterine route., Disp: , Rfl:    montelukast (SINGULAIR) 10 MG tablet, Take 10 mg by mouth at bedtime., Disp: , Rfl:    omeprazole (PRILOSEC) 20 MG capsule, Take 20 mg by mouth every evening., Disp: , Rfl:    ondansetron (ZOFRAN-ODT) 8 MG disintegrating tablet, Take 1 tablet (8 mg total) by mouth every 8 (eight) hours as needed for nausea or vomiting., Disp: 20 tablet, Rfl: 0   SUMAtriptan (IMITREX) 100 MG tablet, Take 100 mg by mouth every 2 (two) hours as needed for migraine., Disp: , Rfl:    SYMBICORT 160-4.5 MCG/ACT inhaler, Inhale 2 puffs into the lungs daily., Disp: , Rfl:    tamsulosin (FLOMAX) 0.4 MG CAPS capsule, Take 1 capsule (0.4 mg total) by mouth daily after  supper. (Patient not taking: Reported on 06/04/2022), Disp: 30 capsule, Rfl: 0   topiramate (TOPAMAX) 50 MG tablet, Take 50 mg by mouth daily., Disp: , Rfl:    traZODone (DESYREL) 100 MG tablet, Take 100 mg by mouth at bedtime., Disp: , Rfl:    Vitamin D, Ergocalciferol, (DRISDOL) 1.25 MG (50000 UNIT) CAPS capsule, Take 50,000 Units by mouth every Sunday., Disp: , Rfl:

## 2022-07-24 ENCOUNTER — Telehealth: Payer: Self-pay

## 2022-07-24 NOTE — Telephone Encounter (Addendum)
Michele Riffle, MD  Bertram Millard, RN Please set patient up to see me in clinic when I am in and she can make appt   Will schedule pt on Dr Tenny Craw add in day.

## 2022-07-24 NOTE — Telephone Encounter (Signed)
-----   Message from Pricilla Riffle, MD sent at 07/23/2022  2:28 PM EDT ----- Regarding: RE: LHC 06/05/22-no follow up scheduled Please set patient up to see me in clinic when I am in and she can make appt ----- Message ----- From: Jacqlyn Krauss, RN Sent: 06/23/2022  11:29 AM EDT To: Jacqlyn Krauss, RN; Pricilla Riffle, MD; # Subject: FW: LHC 06/05/22-no follow up scheduled         Hey Dr Tenny Craw,   [facility] 06/05/22 no follow up scheduled with you, she does have an appointment 07/08/22 with Dr Francine Graven in Pulmonology. Does she need to schedule follow up appointment with you?  Thanks, Programmer, applications ----- Message ----- From: Bertram Millard, RN Sent: 06/02/2022   5:29 PM EDT To: Jacqlyn Krauss, RN; Pricilla Riffle, MD; # Subject: LHC                                            Pt to have LHC for abnormal stress test with Dr Katrinka Blazing 06/05/22 at 1:30 pm.   Thank you!  Rudene Anda   Dr Tenny Craw... just need updated note

## 2022-07-26 ENCOUNTER — Emergency Department (HOSPITAL_COMMUNITY): Payer: 59

## 2022-07-26 ENCOUNTER — Emergency Department (HOSPITAL_COMMUNITY)
Admission: EM | Admit: 2022-07-26 | Discharge: 2022-07-26 | Disposition: A | Discharge status: Home / Self Care | Payer: 59 | Attending: Emergency Medicine | Admitting: Emergency Medicine

## 2022-07-26 ENCOUNTER — Encounter (HOSPITAL_COMMUNITY): Payer: Self-pay | Admitting: Emergency Medicine

## 2022-07-26 DIAGNOSIS — R110 Nausea: Secondary | ICD-10-CM | POA: Insufficient documentation

## 2022-07-26 DIAGNOSIS — R0789 Other chest pain: Secondary | ICD-10-CM | POA: Diagnosis present

## 2022-07-26 DIAGNOSIS — R079 Chest pain, unspecified: Secondary | ICD-10-CM

## 2022-07-26 DIAGNOSIS — R059 Cough, unspecified: Secondary | ICD-10-CM | POA: Diagnosis not present

## 2022-07-26 DIAGNOSIS — E119 Type 2 diabetes mellitus without complications: Secondary | ICD-10-CM | POA: Diagnosis not present

## 2022-07-26 DIAGNOSIS — J45909 Unspecified asthma, uncomplicated: Secondary | ICD-10-CM | POA: Diagnosis not present

## 2022-07-26 DIAGNOSIS — I10 Essential (primary) hypertension: Secondary | ICD-10-CM | POA: Diagnosis not present

## 2022-07-26 LAB — BASIC METABOLIC PANEL
Anion gap: 6 (ref 5–15)
BUN: 10 mg/dL (ref 6–20)
CO2: 26 mmol/L (ref 22–32)
Calcium: 8.2 mg/dL — ABNORMAL LOW (ref 8.9–10.3)
Chloride: 111 mmol/L (ref 98–111)
Creatinine, Ser: 0.91 mg/dL (ref 0.44–1.00)
GFR, Estimated: >60 mL/min (ref >60)
Glucose, Bld: 93 mg/dL (ref 70–99)
Potassium: 3.8 mmol/L (ref 3.5–5.1)
Sodium: 143 mmol/L (ref 135–145)

## 2022-07-26 LAB — TROPONIN I (HIGH SENSITIVITY)
Troponin I (High Sensitivity): <2 ng/L (ref <18)
Troponin I (High Sensitivity): 2 ng/L (ref <18)

## 2022-07-26 LAB — CBC
HCT: 42.5 % (ref 36.0–46.0)
Hemoglobin: 13.9 g/dL (ref 12.0–15.0)
MCH: 29.3 pg (ref 26.0–34.0)
MCHC: 32.7 g/dL (ref 30.0–36.0)
MCV: 89.7 fL (ref 80.0–100.0)
Platelets: 370 10*3/uL (ref 150–400)
RBC: 4.74 MIL/uL (ref 3.87–5.11)
RDW: 12.4 % (ref 11.5–15.5)
WBC: 4.8 10*3/uL (ref 4.0–10.5)
nRBC: 0 % (ref 0.0–0.2)

## 2022-07-26 LAB — I-STAT BETA HCG BLOOD, ED (MC, WL, AP ONLY): I-stat hCG, quantitative: <5 m[IU]/mL (ref <5)

## 2022-07-26 MED ORDER — ALUM & MAG HYDROXIDE-SIMETH 200-200-20 MG/5ML PO SUSP
30.0000 mL | Freq: Once | ORAL | Status: AC
  Administered 2022-07-26: 30 mL via ORAL
  Filled 2022-07-26: qty 30

## 2022-07-26 MED ORDER — IPRATROPIUM-ALBUTEROL 0.5-2.5 (3) MG/3ML IN SOLN
3.0000 mL | Freq: Once | RESPIRATORY_TRACT | Status: AC
  Administered 2022-07-26: 3 mL via RESPIRATORY_TRACT
  Filled 2022-07-26: qty 3

## 2022-07-26 MED ORDER — ASPIRIN 325 MG PO TABS
325.0000 mg | ORAL_TABLET | Freq: Every day | ORAL | Status: DC
  Administered 2022-07-26: 325 mg via ORAL
  Filled 2022-07-26: qty 1

## 2022-07-26 NOTE — ED Provider Notes (Signed)
[facility] DEPT Provider Note   CSN: [id number] Arrival date & time: 07/26/22  1145     History  Chief Complaint  Patient presents with   Chest Pain    Michele Craig is a 51 y.o. female.   Chest Pain    Patient with medical history of OSA, asthma, hypertension, hyperlipidemia, GERD, type 2 diabetes presents today due to chest pain.  Chest pain started 4 days ago at night, its worse in the evenings but has been intermittent throughout the day as well.  It feels like a burning pain, radiates to her left arm and to her back.  Associated with nausea but no vomiting.  She does feel short of breath, she has been using her DuoNeb treatments which does not really improve the chest pain.  She endorses nonproductive cough, postnasal drip.  Denies any recent travel or surgeries, no history of PE, blood clots.  Not anticoagulated.    Patient had a left heart cath done on 06/05/2022 with Dr. Tamala Julian which was reportedly negative.  No stents placed.  Home Medications Prior to Admission medications   Medication Sig Start Date End Date Taking? Authorizing Provider  ADVAIR DISKUS 250-50 MCG/DOSE AEPB Inhale 1 puff into the lungs 2 (two) times daily.  05/17/14   [provider]  ARIPiprazole (ABILIFY) 10 MG tablet Take 10 mg by mouth every morning. 04/18/21   [provider]  buPROPion (WELLBUTRIN XL) 150 MG 24 hr tablet Take 450 mg by mouth daily. 05/17/18   [provider]  cefadroxil (DURICEF) 500 MG capsule Take 1 capsule (500 mg total) by mouth 2 (two) times daily. 06/01/22   Prosperi, Christian H, PA-C  CIMZIA PREFILLED 2 X 200 MG/ML PSKT Inject 200 mg into the skin every 14 (fourteen) days. 04/22/21   [provider]  clonazePAM (KLONOPIN) 2 MG tablet Take 2 mg by mouth at bedtime. 03/03/21   [provider]  DULoxetine (CYMBALTA) 60 MG capsule Take 60 mg by mouth daily. 03/20/22   [provider]  fluconazole (DIFLUCAN)  150 MG tablet Take 1 tablet (150 mg total) by mouth daily. Take 1 tablet at first sign of symptoms, you can take second tablet in 7 days if you continue to have yeast infection symptoms Patient not taking: Reported on 06/04/2022 06/01/22   Prosperi, Christian H, PA-C  fluticasone (FLONASE) 50 MCG/ACT nasal spray Place 1 spray into both nostrils daily as needed for allergies. 04/17/21   [provider]  fluticasone (FLOVENT HFA) 44 MCG/ACT inhaler Inhale 1 puff into the lungs daily as needed (for shortness of breath).    [provider]  gabapentin (NEURONTIN) 100 MG capsule 400 mg 2 (two) times daily. 07/07/21   [provider]  hydroxychloroquine (PLAQUENIL) 200 MG tablet Take 200 mg by mouth 2 (two) times daily. 04/27/21   [provider]  ipratropium (ATROVENT) 0.03 % nasal spray Place 2 sprays into both nostrils every 12 (twelve) hours. Patient not taking: Reported on 06/04/2022 02/24/22   Freddi Starr, MD  levalbuterol [facility] HFA) 45 MCG/ACT inhaler Inhale 2 puffs into the lungs 3 (three) times daily as needed for shortness of breath.    [provider]  levalbuterol Penne Lash) 0.63 MG/3ML nebulizer solution Take 3 mLs (0.63 mg total) by nebulization every 6 (six) hours as needed for wheezing or shortness of breath. 01/12/22   Oswald Hillock, MD  levocetirizine (XYZAL) 5 MG tablet Take 5 mg by mouth every evening.  04/30/21   [provider]  levonorgestrel (MIRENA, 52 MG,) 20 MCG/DAY IUD Mirena 20 mcg/24 hours (7 yrs) 52 mg intrauterine device  Take 1 device by intrauterine route.    [provider]  montelukast (SINGULAIR) 10 MG tablet Take 10 mg by mouth at bedtime.    [provider]  omeprazole (PRILOSEC) 20 MG capsule Take 20 mg by mouth every evening. 07/05/21   [provider]  ondansetron (ZOFRAN-ODT) 8 MG disintegrating tablet Take 1 tablet (8 mg total) by mouth every 8 (eight) hours as needed for nausea or  vomiting. 03/30/22   Sherrill Raring, PA-C  SUMAtriptan (IMITREX) 100 MG tablet Take 100 mg by mouth every 2 (two) hours as needed for migraine. 11/28/21   [provider]  SYMBICORT 160-4.5 MCG/ACT inhaler Inhale 2 puffs into the lungs daily. 06/03/22   [provider]  tamsulosin (FLOMAX) 0.4 MG CAPS capsule Take 1 capsule (0.4 mg total) by mouth daily after supper. Patient not taking: Reported on 06/04/2022 03/30/22   Sherrill Raring, PA-C  topiramate (TOPAMAX) 50 MG tablet Take 50 mg by mouth daily. 01/29/21   [provider]  traZODone (DESYREL) 100 MG tablet Take 100 mg by mouth at bedtime. 04/18/21   [provider]  Vitamin D, Ergocalciferol, (DRISDOL) 1.25 MG (50000 UNIT) CAPS capsule Take 50,000 Units by mouth every Sunday. 05/28/22   [provider]      Allergies    Shrimp [shellfish allergy], Duricef [cefadroxil], Penicillins, Zosyn [piperacillin sod-tazobactam so], and Clindamycin    Review of Systems   Review of Systems  Cardiovascular:  Positive for chest pain.    Physical Exam Updated Vital Signs BP 127/83   Pulse 72   Temp 98.6 F (37 C) (Oral)   Resp 17   SpO2 100%  Physical Exam Vitals and nursing note reviewed. Exam conducted with a chaperone present.  Constitutional:      Appearance: Normal appearance.     Comments: Elevated BMI  HENT:     Head: Normocephalic and atraumatic.  Eyes:     General: No scleral icterus.       Right eye: No discharge.        Left eye: No discharge.     Extraocular Movements: Extraocular movements intact.     Pupils: Pupils are equal, round, and reactive to light.  Neck:     Vascular: No JVD.  Cardiovascular:     Rate and Rhythm: Normal rate and regular rhythm.     Pulses: Normal pulses.     Heart sounds: Normal heart sounds. No murmur heard.    No friction rub. No gallop.  Pulmonary:     Effort: Pulmonary effort is normal. No respiratory distress.     Breath sounds: Normal breath sounds.      Comments: Mild wheeze to the lower lobes bilaterally.  No tachypnea, speaking complete sentences. Abdominal:     General: Abdomen is flat. Bowel sounds are normal. There is no distension.     Palpations: Abdomen is soft.     Tenderness: There is no abdominal tenderness.  Musculoskeletal:     Right lower leg: No edema.     Left lower leg: No edema.  Skin:    General: Skin is warm and dry.     Coloration: Skin is not jaundiced.  Neurological:     Mental Status: She is alert. Mental status is at baseline.     Coordination: Coordination normal.     ED Results /  Procedures / Treatments   Labs (all labs ordered are listed, but only abnormal results are displayed) Labs Reviewed  BASIC METABOLIC PANEL - Abnormal; Notable for the following components:      Result Value   Calcium 8.2 (*)    All other components within normal limits  CBC  I-STAT BETA HCG BLOOD, ED ([facility], [facility], [facility] ONLY)  TROPONIN I (HIGH SENSITIVITY)  TROPONIN I (HIGH SENSITIVITY)    EKG EKG Interpretation  Date/Time:  Saturday July 26 2022 11:57:01 EDT Ventricular Rate:  99 PR Interval:  143 QRS Duration: 154 QT Interval:  399 QTC Calculation: 513 R Axis:   52 Text Interpretation: Sinus rhythm Left bundle branch block Baseline wander in lead(s) II III aVR aVF V4 V5 V6 No significant change since last tracing Confirmed by Gloris Manchester ([id number]) on 07/26/2022 1:09:10 PM  Radiology DG Chest 2 View  Result Date: 07/26/2022 CLINICAL DATA:  Cough, chest pain, and shortness of breath for the past week. EXAM: CHEST - 2 VIEW COMPARISON:  Chest x-ray dated January 09, 2022. FINDINGS: The heart size and mediastinal contours are within normal limits. Both lungs are clear. The visualized skeletal structures are unremarkable. IMPRESSION: No active cardiopulmonary disease. Electronically Signed   By: Obie Dredge M.D.   On: 07/26/2022 12:25    Procedures Procedures    Medications Ordered in ED Medications  aspirin tablet 325  mg (325 mg Oral Given 07/26/22 1322)  alum & mag hydroxide-simeth (MAALOX/MYLANTA) 200-200-20 MG/5ML suspension 30 mL (30 mLs Oral Given 07/26/22 1322)  ipratropium-albuterol (DUONEB) 0.5-2.5 (3) MG/3ML nebulizer solution 3 mL (3 mLs Nebulization Given 07/26/22 1322)  ipratropium-albuterol (DUONEB) 0.5-2.5 (3) MG/3ML nebulizer solution 3 mL (3 mLs Nebulization Given 07/26/22 1527)    ED Course/ Medical Decision Making/ A&P                           Medical Decision Making Amount and/or Complexity of Data Reviewed Labs: ordered. Radiology: ordered.  Risk OTC drugs. Prescription drug management.   Patient presents due to intermittent chest pain x4 days.  Differential includes not limited to ACS, PE, pneumonia, CHF, pneumothorax, esophageal rupture, aortic dissection, AAA.  Patient does not appear septic on exam.  S1-S2, no murmurs.  Upper and lower extremity pulses are 2+ and symmetric bilaterally.  No JVD.  No lower extremity edema, no calf tenderness.  There are some mild wheezing to the lower lobes but good air movement and patient is not tachypneic or in respiratory distress.  Not hypoxic. -BP 112/80 (BP Location: Right Arm)   Pulse 82   Temp 98.6 F (37 C) (Oral)   Resp 18   SpO2 97%   I ordered, viewed and interpreted laboratory work-up. -CBC without leukocytosis or anemia -BMP without gross electrolyte derangement, AKI -Patient is not pregnant -Delta troponin negative.  Patient is on cardiac monitoring is in sinus rhythm with rate 82 per my interpretation in the room.  EKG was obtained.  Patient is has a left bundle branch block but no changes compared to previous.    I ordered, viewed and interpreted chest x-ray.  No cardiomegaly or acute process, agree with radiologist dictation.  I reviewed patient's home medication.  I also ordered aspirin, DuoNeb and Maalox while here in the ED.  Presentation does not appear consistent with new onset of CHF, there is no rales on exam,  no cardiomegaly and she is not having any dyspnea or orthopnea.  I considered  PE, patient is not hypoxic, she was initially tachycardic without resolve on the monitor and there is no signs of right heart strain on her EKG.  Pain is not pleuritic, I think PE is less likely although I did consider it.  Negative troponin and no EKG changes makes ACS unlikely.  I reevaluate the patient, she is feeling significantly improved after the DuoNeb's.  Pain is fully resolved.  This could be an atypical asthma exacerbation presentation.  I do feel patient is appropriate for outpatient follow-up with cardiology with strict return precautions were discussed with the patient and her husband.  Case discussed with my attending Dr. Doren Custard who agrees with plan.        Final Clinical Impression(s) / ED Diagnoses Final diagnoses:  Nonspecific chest pain    Rx / DC Orders ED Discharge Orders     None         Sherrill Raring, Hershal Coria 07/26/22 1536    Godfrey Pick, MD 07/27/22 0000

## 2022-07-26 NOTE — ED Triage Notes (Signed)
Patient c/o chest pain with cough and SOB x1 week. Hx asthma. Reports using inhaler and breathing treatments at home with little relief.

## 2022-07-26 NOTE — Discharge Instructions (Addendum)
You are seen today in the emergency department for chest pain.  Your work-up today was very reassuring, no signs of tach on the imaging.  Chest x-ray was clear for pneumonia.  Continue home medicine, call your cardiologist on Monday and let them know you are seen in the ED and schedule reevaluation.  Return to the ED the chest pain becomes constant, changes in someway or you have new concerning symptoms.

## 2022-07-28 NOTE — Telephone Encounter (Signed)
Called pt to set her up to see Dr Harrington Challenger and she already had an appt with Elwyn Reach 08/07/22.. I offered her an appt with Dr Harrington Challenger for 08/12/22 but she declined and says she cannot be seen until after 08/14/22 and must be an afternoon appt due to getting a ride.   I made her an apt with Elvin So NP for 08/19/22.   Pt says she has been doing and feeling well.

## 2022-08-07 ENCOUNTER — Ambulatory Visit: Payer: 59 | Admitting: Nurse Practitioner

## 2022-08-10 DIAGNOSIS — Z419 Encounter for procedure for purposes other than remedying health state, unspecified: Secondary | ICD-10-CM | POA: Diagnosis not present

## 2022-08-17 NOTE — Progress Notes (Deleted)
Office Visit    Patient Name: Michele Craig Date of Encounter: 08/17/2022  Primary Care Provider:  Marva Panda, NP Primary Cardiologist:  None Primary Electrophysiologist: None  Chief Complaint    Michele Craig is a 51 y.o. female with PMH of NICM, LBBB, HTN, asthma, DM type II, GERD, depression, anxiety, anemia, OSA (not on CPAP) who presents today for follow-up of nonobstructive CAD.  Past Medical History    Past Medical History:  Diagnosis Date   Anemia    Anxiety    Arthritis    Complication of anesthesia    with carpal tunnel syndrome the block didn't work well, she could feel things during the surgery   Depression    Family history of adverse reaction to anesthesia    unknown, pt is adopted   Fibromyalgia    Foot pain    GERD (gastroesophageal reflux disease)    Headache    Hypertension    IBS (irritable bowel syndrome)    after cholecystectomy and then it went away after Gastric sleeve   Moderate persistent asthma    OSA (obstructive sleep apnea)    does not use cpap   RA (rheumatoid arthritis) (HCC)    Shortness of breath dyspnea    only when her asthma is flaring up   Type 2 diabetes mellitus (HCC)    pt states after her gastric sleeve procedure she is no longer on medications and is not checked for it   Past Surgical History:  Procedure Laterality Date   CARPAL TUNNEL RELEASE     CARPAL TUNNEL RELEASE Right 04/04/2016   Procedure: RIGHT CARPAL TUNNEL RELEASE;  Surgeon: Michele Hamman, MD;  Location: [facility] OR;  Service: Orthopedics;  Laterality: Right;  Synovectomy    CESAREAN SECTION     x 2   CHOLECYSTECTOMY     deuodenal switch     DILATION AND CURETTAGE OF UTERUS     LAPAROSCOPIC GASTRIC SLEEVE RESECTION     LEFT HEART CATH AND CORONARY ANGIOGRAPHY N/A 06/05/2022   Procedure: LEFT HEART CATH AND CORONARY ANGIOGRAPHY;  Surgeon: Michele Records, MD;  Location: [facility] INVASIVE CV LAB;  Service: Cardiovascular;  Laterality: N/A;   TONSILLECTOMY       Allergies  Allergies  Allergen Reactions   Shrimp [Shellfish Allergy] Anaphylaxis    Crawdads    Duricef [Cefadroxil] Diarrhea, Rash and Other (See Comments)    Abdominal pain   Penicillins Hives    Has patient had a PCN reaction causing immediate rash, facial/tongue/throat swelling, SOB or lightheadedness with hypotension: No Has patient had a PCN reaction causing severe rash involving mucus membranes or skin necrosis: No Has patient had a PCN reaction that required hospitalization No Has patient had a PCN reaction occurring within the last 10 years: No If all of the above answers are "NO", then may proceed with Cephalosporin use.   Zosyn [Piperacillin Sod-Tazobactam So] Hives and Itching   Clindamycin Itching and Nausea Only    Stomach pains    History of Present Illness    Michele Craig  is a 51 year old female with the above mention past medical history who presents today for follow-up of nonobstructive CAD.  [name] was initially seen by Dr. Tenny Craig and 04/2022 for surgical clearance of urologic surgery.  She was seen for [facility] and 2D echo for additional clearance.  2D echo was completed and revealed EF 40-45%, moderate LVH, normal RV function with no valve abnormalities.  Lexiscan Myoview completed and revealed high risk study with ischemia noted.  She was sent for [facility] for further evaluation of abnormal Myoview that showed normal coronaries, LVEDP of 10 mmHg with dyssynchronous wall motion due to LBBB and EF of 45-50% by visual estimate.  He was seen in the ED on 07/26/2022 for complaint of chest pain.  She described pain of burning that radiated to the left arm and into the back.  She was also noted to have shortness of breath and was using DuoNeb treatments.  She was euvolemic on examination with negative troponin and no ischemic findings on EKG.  Chest x-ray completed with no cardiomegaly noted.  Since last being seen in the office patient reports***.  Patient denies  chest pain, palpitations, dyspnea, PND, orthopnea, nausea, vomiting, dizziness, syncope, edema, weight gain, or early satiety.   ***Notes:  Home Medications    Current Outpatient Medications  Medication Sig Dispense Refill   ADVAIR DISKUS 250-50 MCG/DOSE AEPB Inhale 1 puff into the lungs 2 (two) times daily.      ARIPiprazole (ABILIFY) 10 MG tablet Take 10 mg by mouth every morning.     buPROPion (WELLBUTRIN XL) 150 MG 24 hr tablet Take 450 mg by mouth daily.     cefadroxil (DURICEF) 500 MG capsule Take 1 capsule (500 mg total) by mouth 2 (two) times daily. 14 capsule 0   CIMZIA PREFILLED 2 X 200 MG/ML PSKT Inject 200 mg into the skin every 14 (fourteen) days.     clonazePAM (KLONOPIN) 2 MG tablet Take 2 mg by mouth at bedtime.     DULoxetine (CYMBALTA) 60 MG capsule Take 60 mg by mouth daily.     fluconazole (DIFLUCAN) 150 MG tablet Take 1 tablet (150 mg total) by mouth daily. Take 1 tablet at first sign of symptoms, you can take second tablet in 7 days if you continue to have yeast infection symptoms (Patient not taking: Reported on 06/04/2022) 2 tablet 0   fluticasone (FLONASE) 50 MCG/ACT nasal spray Place 1 spray into both nostrils daily as needed for allergies.     fluticasone (FLOVENT HFA) 44 MCG/ACT inhaler Inhale 1 puff into the lungs daily as needed (for shortness of breath).     gabapentin (NEURONTIN) 100 MG capsule 400 mg 2 (two) times daily.     hydroxychloroquine (PLAQUENIL) 200 MG tablet Take 200 mg by mouth 2 (two) times daily.     ipratropium (ATROVENT) 0.03 % nasal spray Place 2 sprays into both nostrils every 12 (twelve) hours. (Patient not taking: Reported on 06/04/2022) 30 mL 12   levalbuterol (XOPENEX HFA) 45 MCG/ACT inhaler Inhale 2 puffs into the lungs 3 (three) times daily as needed for shortness of breath.     levalbuterol (XOPENEX) 0.63 MG/3ML nebulizer solution Take 3 mLs (0.63 mg total) by nebulization every 6 (six) hours as needed for wheezing or shortness of breath.  30 mL 5   levocetirizine (XYZAL) 5 MG tablet Take 5 mg by mouth every evening.     levonorgestrel (MIRENA, 52 MG,) 20 MCG/DAY IUD Mirena 20 mcg/24 hours (7 yrs) 52 mg intrauterine device  Take 1 device by intrauterine route.     montelukast (SINGULAIR) 10 MG tablet Take 10 mg by mouth at bedtime.     omeprazole (PRILOSEC) 20 MG capsule Take 20 mg by mouth every evening.     ondansetron (ZOFRAN-ODT) 8 MG disintegrating tablet Take 1 tablet (8 mg total) by mouth every 8 (eight) hours as needed for nausea or  vomiting. 20 tablet 0   SUMAtriptan (IMITREX) 100 MG tablet Take 100 mg by mouth every 2 (two) hours as needed for migraine.     SYMBICORT 160-4.5 MCG/ACT inhaler Inhale 2 puffs into the lungs daily.     tamsulosin (FLOMAX) 0.4 MG CAPS capsule Take 1 capsule (0.4 mg total) by mouth daily after supper. (Patient not taking: Reported on 06/04/2022) 30 capsule 0   topiramate (TOPAMAX) 50 MG tablet Take 50 mg by mouth daily.     traZODone (DESYREL) 100 MG tablet Take 100 mg by mouth at bedtime.     Vitamin D, Ergocalciferol, (DRISDOL) 1.25 MG (50000 UNIT) CAPS capsule Take 50,000 Units by mouth every Sunday.     No current facility-administered medications for this visit.     Review of Systems  Please see the history of present illness.    (+)*** (+)***  All other systems reviewed and are otherwise negative except as noted above.  Physical Exam    Wt Readings from Last 3 Encounters:  06/05/22 (!) 323 lb (146.5 kg)  06/01/22 (!) 323 lb (146.5 kg)  05/06/22 (!) 327 lb (148.3 kg)   ZO:XWRUE were no vitals filed for this visit.,There is no height or weight on file to calculate BMI.  Constitutional:      Appearance: Healthy appearance. Not in distress.  Neck:     Vascular: JVD normal.  Pulmonary:     Effort: Pulmonary effort is normal.     Breath sounds: No wheezing. No rales. Diminished in the bases Cardiovascular:     Normal rate. Regular rhythm. Normal S1. Normal S2.      Murmurs:  There is no murmur.  Edema:    Peripheral edema absent.  Abdominal:     Palpations: Abdomen is soft non tender. There is no hepatomegaly.  Skin:    General: Skin is warm and dry.  Neurological:     General: No focal deficit present.     Mental Status: Alert and oriented to person, place and time.     Cranial Nerves: Cranial nerves are intact.  EKG/LABS/Other Studies Reviewed    ECG personally reviewed by me today - ***  Risk Assessment/Calculations:   {Does this patient have ATRIAL FIBRILLATION?[phone]}        Lab Results  Component Value Date   WBC 4.8 07/26/2022   HGB 13.9 07/26/2022   HCT 42.5 07/26/2022   MCV 89.7 07/26/2022   PLT 370 07/26/2022   Lab Results  Component Value Date   CREATININE 0.91 07/26/2022   BUN 10 07/26/2022   NA 143 07/26/2022   K 3.8 07/26/2022   CL 111 07/26/2022   CO2 26 07/26/2022   Lab Results  Component Value Date   ALT 23 01/10/2022   AST 21 01/10/2022   ALKPHOS 82 01/10/2022   BILITOT 0.3 01/10/2022   No results found for: "CHOL", "HDL", "LDLCALC", "LDLDIRECT", "TRIG", "CHOLHDL"  Lab Results  Component Value Date   HGBA1C 4.6 (L) 01/09/2022    Assessment & Plan    1.  Nonischemic cardiomyopathy: -LHC performed 05/2022 with normal coronaries and dyssynchronous wall motion noted. -2D echo revealed EF of 45-50%   2.  Nonobstructive CAD: -Lexiscan Myoview completed revealing high risk study -LHC performed with normal coronaries and no valvular abnormalities   3.  Hyperlipidemia:   4.  Essential hypertension: -      Disposition: Follow-up with None or APP in *** months {Are you ordering a CV Procedure (e.g. stress test, cath,  DCCV, TEE, etc)?   Press F2        :[id number]}   Medication Adjustments/Labs and Tests Ordered: Current medicines are reviewed at length with the patient today.  Concerns regarding medicines are outlined above.   Signed, Mable Fill, Marissa Nestle, NP 08/17/2022, 1:18 PM [facility]  Note:  This document was prepared using Dragon voice recognition software and may include unintentional dictation errors.

## 2022-08-19 ENCOUNTER — Ambulatory Visit: Payer: 59 | Attending: Nurse Practitioner | Admitting: Nurse Practitioner

## 2022-08-19 DIAGNOSIS — I428 Other cardiomyopathies: Secondary | ICD-10-CM

## 2022-09-10 DIAGNOSIS — Z419 Encounter for procedure for purposes other than remedying health state, unspecified: Secondary | ICD-10-CM | POA: Diagnosis not present

## 2022-09-23 NOTE — Progress Notes (Signed)
Erroneous encounter-disregard

## 2022-09-30 ENCOUNTER — Encounter: Payer: 59 | Admitting: Family

## 2022-09-30 DIAGNOSIS — Z7689 Persons encountering health services in other specified circumstances: Secondary | ICD-10-CM

## 2022-10-10 DIAGNOSIS — Z419 Encounter for procedure for purposes other than remedying health state, unspecified: Secondary | ICD-10-CM | POA: Diagnosis not present

## 2022-11-10 DIAGNOSIS — Z419 Encounter for procedure for purposes other than remedying health state, unspecified: Secondary | ICD-10-CM | POA: Diagnosis not present

## 2022-11-12 ENCOUNTER — Ambulatory Visit: Payer: 59 | Admitting: Family Medicine

## 2022-12-01 ENCOUNTER — Other Ambulatory Visit: Payer: 59

## 2022-12-02 ENCOUNTER — Other Ambulatory Visit: Payer: 59

## 2022-12-02 DIAGNOSIS — I10 Essential (primary) hypertension: Secondary | ICD-10-CM

## 2022-12-02 DIAGNOSIS — Z1159 Encounter for screening for other viral diseases: Secondary | ICD-10-CM

## 2022-12-02 DIAGNOSIS — E1159 Type 2 diabetes mellitus with other circulatory complications: Secondary | ICD-10-CM

## 2022-12-02 DIAGNOSIS — Z13220 Encounter for screening for lipoid disorders: Secondary | ICD-10-CM

## 2022-12-02 DIAGNOSIS — M797 Fibromyalgia: Secondary | ICD-10-CM

## 2022-12-02 DIAGNOSIS — Z114 Encounter for screening for human immunodeficiency virus [HIV]: Secondary | ICD-10-CM

## 2022-12-02 DIAGNOSIS — R931 Abnormal findings on diagnostic imaging of heart and coronary circulation: Secondary | ICD-10-CM

## 2022-12-03 ENCOUNTER — Ambulatory Visit (INDEPENDENT_AMBULATORY_CARE_PROVIDER_SITE_OTHER): Payer: 59 | Admitting: Family Medicine

## 2022-12-03 VITALS — BP 120/80 | HR 83 | Temp 98.5°F | Ht 67.0 in | Wt 327.0 lb

## 2022-12-03 DIAGNOSIS — Z0001 Encounter for general adult medical examination with abnormal findings: Secondary | ICD-10-CM

## 2022-12-03 DIAGNOSIS — Z23 Encounter for immunization: Secondary | ICD-10-CM

## 2022-12-03 DIAGNOSIS — Z1211 Encounter for screening for malignant neoplasm of colon: Secondary | ICD-10-CM | POA: Diagnosis not present

## 2022-12-03 DIAGNOSIS — M5442 Lumbago with sciatica, left side: Secondary | ICD-10-CM

## 2022-12-03 DIAGNOSIS — M5441 Lumbago with sciatica, right side: Secondary | ICD-10-CM

## 2022-12-03 DIAGNOSIS — Z1231 Encounter for screening mammogram for malignant neoplasm of breast: Secondary | ICD-10-CM | POA: Diagnosis not present

## 2022-12-03 DIAGNOSIS — G8929 Other chronic pain: Secondary | ICD-10-CM | POA: Diagnosis not present

## 2022-12-03 DIAGNOSIS — Z7689 Persons encountering health services in other specified circumstances: Secondary | ICD-10-CM | POA: Insufficient documentation

## 2022-12-03 LAB — COMPLETE METABOLIC PANEL WITH GFR
AG Ratio: 1.6 (calc) (ref 1.0–2.5)
ALT: 17 U/L (ref 6–29)
AST: 15 U/L (ref 10–35)
Albumin: 3.6 g/dL (ref 3.6–5.1)
Alkaline phosphatase (APISO): 93 U/L (ref 37–153)
BUN: 7 mg/dL (ref 7–25)
CO2: 26 mmol/L (ref 20–32)
Calcium: 8.9 mg/dL (ref 8.6–10.4)
Chloride: 108 mmol/L (ref 98–110)
Creat: 0.9 mg/dL (ref 0.50–1.03)
Globulin: 2.3 g/dL (calc) (ref 1.9–3.7)
Glucose, Bld: 82 mg/dL (ref 65–99)
Potassium: 4.1 mmol/L (ref 3.5–5.3)
Sodium: 141 mmol/L (ref 135–146)
Total Bilirubin: 0.3 mg/dL (ref 0.2–1.2)
Total Protein: 5.9 g/dL — ABNORMAL LOW (ref 6.1–8.1)
eGFR: 77 mL/min/{1.73_m2} (ref >=60)

## 2022-12-03 LAB — MICROALBUMIN / CREATININE URINE RATIO
Creatinine, Urine: 218 mg/dL (ref 20–275)
Microalb Creat Ratio: 12 mcg/mg creat (ref <30)
Microalb, Ur: 2.6 mg/dL

## 2022-12-03 LAB — CBC WITH DIFFERENTIAL/PLATELET
Absolute Monocytes: 509 cells/uL (ref 200–950)
Basophils Absolute: 47 cells/uL (ref 0–200)
Basophils Relative: 0.7 %
Eosinophils Absolute: 121 cells/uL (ref 15–500)
Eosinophils Relative: 1.8 %
HCT: 42.6 % (ref 35.0–45.0)
Hemoglobin: 14.5 g/dL (ref 11.7–15.5)
Lymphs Abs: 3631 cells/uL (ref 850–3900)
MCH: 30.1 pg (ref 27.0–33.0)
MCHC: 34 g/dL (ref 32.0–36.0)
MCV: 88.6 fL (ref 80.0–100.0)
MPV: 9.4 fL (ref 7.5–12.5)
Monocytes Relative: 7.6 %
Neutro Abs: 2392 cells/uL (ref 1500–7800)
Neutrophils Relative %: 35.7 %
Platelets: 342 10*3/uL (ref 140–400)
RBC: 4.81 10*6/uL (ref 3.80–5.10)
RDW: 12.5 % (ref 11.0–15.0)
Total Lymphocyte: 54.2 %
WBC: 6.7 10*3/uL (ref 3.8–10.8)

## 2022-12-03 LAB — LIPID PANEL
Cholesterol: 108 mg/dL (ref <200)
HDL: 38 mg/dL — ABNORMAL LOW (ref >=50)
LDL Cholesterol (Calc): 56 mg/dL (calc)
Non-HDL Cholesterol (Calc): 70 mg/dL (calc) (ref <130)
Total CHOL/HDL Ratio: 2.8 (calc) (ref <5.0)
Triglycerides: 55 mg/dL (ref <150)

## 2022-12-03 LAB — HEMOGLOBIN A1C
Hgb A1c MFr Bld: 5.4 % of total Hgb (ref <5.7)
Mean Plasma Glucose: 108 mg/dL
eAG (mmol/L): 6 mmol/L

## 2022-12-03 LAB — VITAMIN B12: Vitamin B-12: 753 pg/mL (ref 200–1100)

## 2022-12-03 LAB — HEPATITIS C ANTIBODY: Hepatitis C Ab: NONREACTIVE

## 2022-12-03 LAB — HIV ANTIBODY (ROUTINE TESTING W REFLEX): HIV 1&2 Ab, 4th Generation: NONREACTIVE

## 2022-12-03 MED ORDER — KETOROLAC TROMETHAMINE 10 MG PO TABS: 10.0000 mg | ORAL_TABLET | Freq: Four times a day (QID) | ORAL | 0 refills | Status: DC | PRN

## 2022-12-03 NOTE — Assessment & Plan Note (Signed)
Reviewed patients extensive past medical history and current treatments today. Her labs were normal. A1c 5.4%. BP 120/80 in office today. She is working with bariatrics on her weight management. Other conditions are well controlled on current regimen. Her primary concern is her pain, she is currently on Gabapentin 400mg  BID and Cymbalta 60mg  daily. She gets knee injections for knee pain. Has tried Toradol in the past that worked, will order toradol 10mg  q6h prn for pain today and refer to pain clinic.

## 2022-12-03 NOTE — Patient Instructions (Signed)
It was great to meet you today and I'm excited to have you join the [facility] practice. I hope you had a positive experience today! If you feel so inclined, please feel free to recommend our practice to friends and family. Jennife Zaucha, FNP-C  

## 2022-12-03 NOTE — Progress Notes (Signed)
New Patient Office Visit  Subjective    Patient ID: Michele Craig, female    DOB: 03/12/1971  Age: 52 y.o. MRN: [id number]  CC:  Chief Complaint  Patient presents with   Establish Care    HPI Michele Craig presents to establish care. Oriented to practice routines and expectations. She has a PMH significant for anxiety, asthma, RA, DM, HTN, depression, GERD, OSA, IBS, fibromyalgia, sciatica, vertigo, and DDD. She is established with Bariatrics and Nutritionist, Pulmonology, Rheumatology, Allergy and Asthma specialist. She was seeing urgent care for needs previously. Concerns today include worsening low back pain from DDD with right hip pain and bilateral sciatica. Denies saddle numbness and incontinence. She has tried toradol in the past that worked and would also like a referral to pain clinic.  Cologuard ordered today Mammogram ordered today PAP normal 03/12/2021, repeat 2027 Vaccines: flu today    Outpatient Encounter Medications as of 12/03/2022  Medication Sig   ADVAIR DISKUS 250-50 MCG/DOSE AEPB Inhale 1 puff into the lungs 2 (two) times daily.    ARIPiprazole (ABILIFY) 10 MG tablet Take 10 mg by mouth every morning.   buPROPion (WELLBUTRIN XL) 150 MG 24 hr tablet Take 450 mg by mouth daily.   cefadroxil (DURICEF) 500 MG capsule Take 1 capsule (500 mg total) by mouth 2 (two) times daily.   CIMZIA PREFILLED 2 X 200 MG/ML PSKT Inject 200 mg into the skin every 14 (fourteen) days.   clonazePAM (KLONOPIN) 2 MG tablet Take 2 mg by mouth at bedtime.   DULoxetine (CYMBALTA) 60 MG capsule Take 60 mg by mouth daily.   fluticasone (FLONASE) 50 MCG/ACT nasal spray Place 1 spray into both nostrils daily as needed for allergies.   fluticasone (FLOVENT HFA) 44 MCG/ACT inhaler Inhale 1 puff into the lungs daily as needed (for shortness of breath).   gabapentin (NEURONTIN) 100 MG capsule 400 mg 2 (two) times daily.   hydroxychloroquine (PLAQUENIL) 200 MG tablet Take 200 mg by mouth 2 (two)  times daily.   ipratropium (ATROVENT) 0.03 % nasal spray Place 2 sprays into both nostrils every 12 (twelve) hours.   ketorolac (TORADOL) 10 MG tablet Take 1 tablet (10 mg total) by mouth every 6 (six) hours as needed.   levalbuterol (XOPENEX HFA) 45 MCG/ACT inhaler Inhale 2 puffs into the lungs 3 (three) times daily as needed for shortness of breath.   levalbuterol (XOPENEX) 0.63 MG/3ML nebulizer solution Take 3 mLs (0.63 mg total) by nebulization every 6 (six) hours as needed for wheezing or shortness of breath.   levocetirizine (XYZAL) 5 MG tablet Take 5 mg by mouth every evening.   levonorgestrel (MIRENA, 52 MG,) 20 MCG/DAY IUD Mirena 20 mcg/24 hours (7 yrs) 52 mg intrauterine device  Take 1 device by intrauterine route.   montelukast (SINGULAIR) 10 MG tablet Take 10 mg by mouth at bedtime.   omeprazole (PRILOSEC) 20 MG capsule Take 20 mg by mouth every evening.   SUMAtriptan (IMITREX) 100 MG tablet Take 100 mg by mouth every 2 (two) hours as needed for migraine.   SYMBICORT 160-4.5 MCG/ACT inhaler Inhale 2 puffs into the lungs daily.   traZODone (DESYREL) 100 MG tablet Take 100 mg by mouth at bedtime.   Vitamin D, Ergocalciferol, (DRISDOL) 1.25 MG (50000 UNIT) CAPS capsule Take 50,000 Units by mouth every Sunday.   [DISCONTINUED] fluconazole (DIFLUCAN) 150 MG tablet Take 1 tablet (150 mg total) by mouth daily. Take 1 tablet at first sign of symptoms, you can take  second tablet in 7 days if you continue to have yeast infection symptoms   [DISCONTINUED] ondansetron (ZOFRAN-ODT) 8 MG disintegrating tablet Take 1 tablet (8 mg total) by mouth every 8 (eight) hours as needed for nausea or vomiting.   [DISCONTINUED] tamsulosin (FLOMAX) 0.4 MG CAPS capsule Take 1 capsule (0.4 mg total) by mouth daily after supper.   [DISCONTINUED] topiramate (TOPAMAX) 50 MG tablet Take 50 mg by mouth daily.   No facility-administered encounter medications on file as of 12/03/2022.    Past Medical History:   Diagnosis Date   Anemia    Anxiety    Arthritis    Complication of anesthesia    with carpal tunnel syndrome the block didn't work well, she could feel things during the surgery   Depression    Family history of adverse reaction to anesthesia    unknown, pt is adopted   Fibromyalgia    Foot pain    GERD (gastroesophageal reflux disease)    Headache    Hypertension    IBS (irritable bowel syndrome)    after cholecystectomy and then it went away after Gastric sleeve   Moderate persistent asthma    OSA (obstructive sleep apnea)    does not use cpap   RA (rheumatoid arthritis) (HCC)    Shortness of breath dyspnea    only when her asthma is flaring up   Type 2 diabetes mellitus ([place])    pt states after her gastric sleeve procedure she is no longer on medications and is not checked for it    Past Surgical History:  Procedure Laterality Date   CARPAL TUNNEL RELEASE     CARPAL TUNNEL RELEASE Right 04/04/2016   Procedure: RIGHT CARPAL TUNNEL RELEASE;  Surgeon: Earlie Server, MD;  Location: [facility];  Service: Orthopedics;  Laterality: Right;  Synovectomy    CESAREAN SECTION     x 2   CHOLECYSTECTOMY     deuodenal switch     DILATION AND CURETTAGE OF UTERUS     LAPAROSCOPIC GASTRIC SLEEVE RESECTION     LEFT HEART CATH AND CORONARY ANGIOGRAPHY N/A 06/05/2022   Procedure: LEFT HEART CATH AND CORONARY ANGIOGRAPHY;  Surgeon: Belva Crome, MD;  Location: [facility];  Service: Cardiovascular;  Laterality: N/A;   TONSILLECTOMY      Family History  Adopted: Yes  Family history unknown: Yes    Social History   Socioeconomic History   Marital status: Married    Spouse name: Not on file   Number of children: Not on file   Years of education: Not on file   Highest education level: Not on file  Occupational History   Not on file  Tobacco Use   Smoking status: Never   Smokeless tobacco: Never  Vaping Use   Vaping Use: Never used  Substance and Sexual Activity   Alcohol  use: Not Currently   Drug use: No   Sexual activity: Yes    Birth control/protection: I.U.D.    Comment: 1st intercourse 37 yo-5 partners  Other Topics Concern   Not on file  Social History Narrative   Not on file   Social Determinants of Health   Financial Resource Strain: Not on file  Food Insecurity: Not on file  Transportation Needs: Not on file  Physical Activity: Not on file  Stress: Not on file  Social Connections: Not on file  Intimate Partner Violence: Not on file    Review of Systems  All other systems reviewed and  are negative.       Objective    BP 120/80   Pulse 83   Temp 98.5 F (36.9 C) (Oral)   Ht 5\' 7"  (1.702 m)   Wt (!) 327 lb (148.3 kg)   SpO2 97%   BMI 51.22 kg/m   Physical Exam Vitals and nursing note reviewed.  Constitutional:      Appearance: Normal appearance. She is normal weight.  HENT:     Head: Normocephalic and atraumatic.  Cardiovascular:     Rate and Rhythm: Normal rate and regular rhythm.     Pulses: Normal pulses.     Heart sounds: Normal heart sounds.  Pulmonary:     Effort: Pulmonary effort is normal.     Breath sounds: Normal breath sounds.  Skin:    General: Skin is warm and dry.  Neurological:     General: No focal deficit present.     Mental Status: She is alert and oriented to person, place, and time. Mental status is at baseline.  Psychiatric:        Mood and Affect: Mood normal.        Behavior: Behavior normal.        Thought Content: Thought content normal.        Judgment: Judgment normal.         Assessment & Plan:   Problem List Items Addressed This Visit       Nervous and Auditory   Chronic low back pain with bilateral sciatica   Relevant Medications   ketorolac (TORADOL) 10 MG tablet   Other Relevant Orders   Ambulatory referral to Pain Clinic     Other   Encounter to establish care with new doctor - Primary    Reviewed patients extensive past medical history and current treatments  today. Her labs were normal. A1c 5.4%. BP 120/80 in office today. She is working with bariatrics on her weight management. Other conditions are well controlled on current regimen. Her primary concern is her pain, she is currently on Gabapentin 400mg  BID and Cymbalta 60mg  daily. She gets knee injections for knee pain. Has tried Toradol in the past that worked, will order toradol 10mg  q6h prn for pain today and refer to pain clinic.      Other Visit Diagnoses     Colon cancer screening       Relevant Orders   Cologuard   Encounter for screening mammogram for malignant neoplasm of breast       Relevant Orders   MM DIGITAL SCREENING BILATERAL       Return for annual physical.   Rubie Maid, FNP

## 2022-12-05 ENCOUNTER — Other Ambulatory Visit (INDEPENDENT_AMBULATORY_CARE_PROVIDER_SITE_OTHER): Payer: 59

## 2022-12-05 DIAGNOSIS — Z23 Encounter for immunization: Secondary | ICD-10-CM

## 2022-12-11 DIAGNOSIS — Z419 Encounter for procedure for purposes other than remedying health state, unspecified: Secondary | ICD-10-CM | POA: Diagnosis not present

## 2022-12-13 LAB — COLOGUARD

## 2022-12-15 ENCOUNTER — Telehealth: Payer: Self-pay | Admitting: Family Medicine

## 2022-12-15 NOTE — Telephone Encounter (Signed)
Patient called in stating that the ketorolac 10 mg worked for her pain. She states that 20 pills were called in for her and she wasn't sure if you were wanting to know if they helped before sending in more. I told her it looked as if you had referred her to a pain clinic and I gave her that referral information. She wanted to know if you would refill the ketorolac since it did help.  She uses [facility] on [address]  CB# [phone]

## 2022-12-16 NOTE — Telephone Encounter (Signed)
Attempted to call pt re FNP msg. LVM for call back

## 2022-12-18 NOTE — Telephone Encounter (Signed)
Pt return call, spoke w/pt. Per pt stated that the ketorolac has worked wonders for her. Per pt would like to stay on this med if possible. Pt also have stated that she has never been on anything long term in the past.  Pt has pain mgmnt appt on 2/19.  Pls advice?

## 2022-12-22 ENCOUNTER — Other Ambulatory Visit: Payer: Self-pay

## 2022-12-22 DIAGNOSIS — G8929 Other chronic pain: Secondary | ICD-10-CM

## 2022-12-22 NOTE — Telephone Encounter (Signed)
Per FNP, order xray for cervical spine and neurosurgery ok, per pt

## 2023-01-02 LAB — COLOGUARD

## 2023-01-09 DIAGNOSIS — Z419 Encounter for procedure for purposes other than remedying health state, unspecified: Secondary | ICD-10-CM | POA: Diagnosis not present

## 2023-01-13 NOTE — Progress Notes (Deleted)
Referring Physician:  Geronimo Boot, PA-C [address] [place],  [place] [address]  Primary Physician:  Rubie Maid, FNP  History of Present Illness: 01/13/2023*** [name] has a history of asthma, depression, GERD, HTN, RA, DM, IBS, FM, and obesity. Also with history of gastric sleeve.   Saw pain management ([facility]- Dr. Burton Apley) on 01/01/23 and was diagnosed with SI joint pain. She was sent to PT and stared on voltaren and robaxin.   Last MRI of lumbar spine in 2018 at [facility].***  Duration: *** Location: *** Quality: *** Severity: ***  Precipitating: aggravated by *** Modifying factors: made better by *** Weakness: none Timing: *** Bowel/Bladder Dysfunction: none  Conservative measures:  Physical therapy: [facility] in [place]?***  Multimodal medical therapy including regular antiinflammatories: motrin, voltaren, robaxin, cymbalta, neurontin,  Injections: *** epidural steroid injections  Past Surgery: ***  Michele Craig has ***no symptoms of cervical myelopathy.  The symptoms are causing a significant impact on the patient's life.   Review of Systems:  A 10 point review of systems is negative, except for the pertinent positives and negatives detailed in the HPI.  Past Medical History: Past Medical History:  Diagnosis Date   Anemia    Anxiety    Arthritis    Complication of anesthesia    with carpal tunnel syndrome the block didn't work well, she could feel things during the surgery   Depression    Family history of adverse reaction to anesthesia    unknown, pt is adopted   Fibromyalgia    Foot pain    GERD (gastroesophageal reflux disease)    Headache    Hypertension    IBS (irritable bowel syndrome)    after cholecystectomy and then it went away after Gastric sleeve   Moderate persistent asthma    OSA (obstructive sleep apnea)    does not use cpap   RA (rheumatoid arthritis) (HCC)    Shortness of breath dyspnea    only when  her asthma is flaring up   Type 2 diabetes mellitus ([place])    pt states after her gastric sleeve procedure she is no longer on medications and is not checked for it    Past Surgical History: Past Surgical History:  Procedure Laterality Date   CARPAL TUNNEL RELEASE     CARPAL TUNNEL RELEASE Right 04/04/2016   Procedure: RIGHT CARPAL TUNNEL RELEASE;  Surgeon: Earlie Server, MD;  Location: [facility];  Service: Orthopedics;  Laterality: Right;  Synovectomy    CESAREAN SECTION     x 2   CHOLECYSTECTOMY     deuodenal switch     DILATION AND CURETTAGE OF UTERUS     LAPAROSCOPIC GASTRIC SLEEVE RESECTION     LEFT HEART CATH AND CORONARY ANGIOGRAPHY N/A 06/05/2022   Procedure: LEFT HEART CATH AND CORONARY ANGIOGRAPHY;  Surgeon: Belva Crome, MD;  Location: [facility];  Service: Cardiovascular;  Laterality: N/A;   TONSILLECTOMY      Allergies: Allergies as of 01/15/2023 - Review Complete 12/03/2022  Allergen Reaction Noted   Shrimp [shellfish allergy] Anaphylaxis 08/05/2011   Duricef [cefadroxil] Diarrhea, Rash, and Other (See Comments) 06/05/2022   Penicillins Hives 11/30/2013   Zosyn [piperacillin sod-tazobactam so] Hives and Itching 04/01/2012   Clindamycin Itching and Nausea Only 03/30/2022    Medications: Outpatient Encounter Medications as of 01/15/2023  Medication Sig   ADVAIR DISKUS 250-50 MCG/DOSE AEPB Inhale 1 puff into the lungs 2 (two) times daily.  ARIPiprazole (ABILIFY) 10 MG tablet Take 10 mg by mouth every morning.   buPROPion (WELLBUTRIN XL) 150 MG 24 hr tablet Take 450 mg by mouth daily.   cefadroxil (DURICEF) 500 MG capsule Take 1 capsule (500 mg total) by mouth 2 (two) times daily.   CIMZIA PREFILLED 2 X 200 MG/ML PSKT Inject 200 mg into the skin every 14 (fourteen) days.   clonazePAM (KLONOPIN) 2 MG tablet Take 2 mg by mouth at bedtime.   DULoxetine (CYMBALTA) 60 MG capsule Take 60 mg by mouth daily.   fluticasone (FLONASE) 50 MCG/ACT nasal spray Place 1 spray  into both nostrils daily as needed for allergies.   fluticasone (FLOVENT HFA) 44 MCG/ACT inhaler Inhale 1 puff into the lungs daily as needed (for shortness of breath).   gabapentin (NEURONTIN) 100 MG capsule 400 mg 2 (two) times daily.   hydroxychloroquine (PLAQUENIL) 200 MG tablet Take 200 mg by mouth 2 (two) times daily.   ipratropium (ATROVENT) 0.03 % nasal spray Place 2 sprays into both nostrils every 12 (twelve) hours.   ketorolac (TORADOL) 10 MG tablet Take 1 tablet (10 mg total) by mouth every 6 (six) hours as needed.   levalbuterol (XOPENEX HFA) 45 MCG/ACT inhaler Inhale 2 puffs into the lungs 3 (three) times daily as needed for shortness of breath.   levalbuterol (XOPENEX) 0.63 MG/3ML nebulizer solution Take 3 mLs (0.63 mg total) by nebulization every 6 (six) hours as needed for wheezing or shortness of breath.   levocetirizine (XYZAL) 5 MG tablet Take 5 mg by mouth every evening.   levonorgestrel (MIRENA, 52 MG,) 20 MCG/DAY IUD Mirena 20 mcg/24 hours (7 yrs) 52 mg intrauterine device  Take 1 device by intrauterine route.   montelukast (SINGULAIR) 10 MG tablet Take 10 mg by mouth at bedtime.   omeprazole (PRILOSEC) 20 MG capsule Take 20 mg by mouth every evening.   SUMAtriptan (IMITREX) 100 MG tablet Take 100 mg by mouth every 2 (two) hours as needed for migraine.   SYMBICORT 160-4.5 MCG/ACT inhaler Inhale 2 puffs into the lungs daily.   traZODone (DESYREL) 100 MG tablet Take 100 mg by mouth at bedtime.   Vitamin D, Ergocalciferol, (DRISDOL) 1.25 MG (50000 UNIT) CAPS capsule Take 50,000 Units by mouth every Sunday.   No facility-administered encounter medications on file as of 01/15/2023.    Social History: Social History   Tobacco Use   Smoking status: Never   Smokeless tobacco: Never  Vaping Use   Vaping Use: Never used  Substance Use Topics   Alcohol use: Not Currently   Drug use: No    Family Medical History: Family History  Adopted: Yes  Family history unknown: Yes     Physical Examination: There were no vitals filed for this visit.  General: Patient is well developed, well nourished, calm, collected, and in no apparent distress. Attention to examination is appropriate.  Respiratory: Patient is breathing without any difficulty.   NEUROLOGICAL:     Awake, alert, oriented to person, place, and time.  Speech is clear and fluent. Fund of knowledge is appropriate.   Cranial Nerves: Pupils equal round and reactive to light.  Facial tone is symmetric.    *** ROM of cervical spine *** pain *** posterior cervical tenderness. *** tenderness in bilateral trapezial region.   *** ROM of lumbar spine *** pain *** posterior lumbar tenderness.   No abnormal lesions on exposed skin.   Strength: Side Biceps Triceps Deltoid Interossei Grip Wrist Ext. Wrist Flex.  R  $'5 5 5 5 5 5 5  'o$ L '5 5 5 5 5 5 5   '$ Side Iliopsoas Quads Hamstring PF DF EHL  R '5 5 5 5 5 5  '$ L '5 5 5 5 5 5   '$ Reflexes are ***2+ and symmetric at the biceps, triceps, brachioradialis, patella and achilles.   Hoffman's is absent.  Clonus is not present.   Bilateral upper and lower extremity sensation is intact to light touch.     Gait is normal.   ***No difficulty with tandem gait.    Medical Decision Making  Imaging: Nothing recent.   I have personally reviewed the images and agree with the above interpretation.***  Assessment and Plan: Ms. Ticknor is a pleasant 52 y.o. female has ***  Treatment options discussed with patient and following plan made:   - Order for physical therapy for *** spine ***. Patient to call to schedule appointment. *** - Continue current medications including ***. Reviewed dosing and side effects.  - Prescription for ***. Reviewed dosing and side effects. Take with food.  - Prescription for *** to take prn muscle spasms. Reviewed dosing and side effects. Discussed this can cause drowsiness.  - MRI of *** to further evaluate *** radiculopathy. No improvement  time or medications (***).  - Referral to PMR at [facility] to discuss possible *** injections.  - Will schedule phone visit to review MRI results once I get them back.   I spent a total of *** minutes in face-to-face and non-face-to-face activities related to this patient's care today including review of outside records, review of imaging, review of symptoms, physical exam, discussion of differential diagnosis, discussion of treatment options, and documentation.   Thank you for involving me in the care of this patient.   Geronimo Boot PA-C Dept. of Neurosurgery

## 2023-01-15 ENCOUNTER — Ambulatory Visit: Payer: 59 | Admitting: Orthopedic Surgery

## 2023-01-28 LAB — COLOGUARD: COLOGUARD: NEGATIVE

## 2023-02-02 ENCOUNTER — Ambulatory Visit: Payer: 59 | Admitting: Orthopedic Surgery

## 2023-02-03 ENCOUNTER — Ambulatory Visit: Payer: 59 | Admitting: Orthopedic Surgery

## 2023-02-06 ENCOUNTER — Ambulatory Visit: Payer: 59

## 2023-02-09 DIAGNOSIS — Z419 Encounter for procedure for purposes other than remedying health state, unspecified: Secondary | ICD-10-CM | POA: Diagnosis not present

## 2023-02-23 ENCOUNTER — Inpatient Hospital Stay
Admission: RE | Admit: 2023-02-23 | Discharge: 2023-02-23 | Disposition: A | Discharge status: Home / Self Care | Payer: Self-pay | Source: Ambulatory Visit | Attending: Orthopedic Surgery | Admitting: Orthopedic Surgery

## 2023-02-23 ENCOUNTER — Other Ambulatory Visit: Payer: Self-pay

## 2023-02-23 DIAGNOSIS — Z049 Encounter for examination and observation for unspecified reason: Secondary | ICD-10-CM

## 2023-02-23 NOTE — Progress Notes (Deleted)
Referring Physician:  Park Meo, FNP [address] [place],  [place] [address]  Primary Physician:  Park Meo, FNP  History of Present Illness: 02/23/2023*** Ms. [place]es Banka has a history of asthma, GERD, HTN, obesity s/p duodenal switch in 2020, OSA, DM, and FM.   She sees pain management at [facility] in [place].       Duration: *** Location: *** Quality: *** Severity: ***  Precipitating: aggravated by *** Modifying factors: made better by *** Weakness: none Timing: *** Bowel/Bladder Dysfunction: none  Conservative measures:  Physical therapy: starting aquatic PT on 03/02/23***  Multimodal medical therapy including regular antiinflammatories: voltaren, robaxin, cymbalta, neurontin, celebrex Injections: *** epidural steroid injections  Past Surgery: ***  [place] B Brabson has ***no symptoms of cervical myelopathy.  The symptoms are causing a significant impact on the patient's life.   Review of Systems:  A 10 point review of systems is negative, except for the pertinent positives and negatives detailed in the HPI.  Past Medical History: Past Medical History:  Diagnosis Date   Anemia    Anxiety    Arthritis    Complication of anesthesia    with carpal tunnel syndrome the block didn't work well, she could feel things during the surgery   Depression    Family history of adverse reaction to anesthesia    unknown, pt is adopted   Fibromyalgia    Foot pain    GERD (gastroesophageal reflux disease)    Headache    Hypertension    IBS (irritable bowel syndrome)    after cholecystectomy and then it went away after Gastric sleeve   Moderate persistent asthma    OSA (obstructive sleep apnea)    does not use cpap   RA (rheumatoid arthritis) (HCC)    Shortness of breath dyspnea    only when her asthma is flaring up   Type 2 diabetes mellitus (HCC)    pt states after her gastric sleeve procedure she is no longer on medications and is not checked for it     Past Surgical History: Past Surgical History:  Procedure Laterality Date   CARPAL TUNNEL RELEASE     CARPAL TUNNEL RELEASE Right 04/04/2016   Procedure: RIGHT CARPAL TUNNEL RELEASE;  Surgeon: Frederico Hamman, MD;  Location: [facility] OR;  Service: Orthopedics;  Laterality: Right;  Synovectomy    CESAREAN SECTION     x 2   CHOLECYSTECTOMY     deuodenal switch     DILATION AND CURETTAGE OF UTERUS     LAPAROSCOPIC GASTRIC SLEEVE RESECTION     LEFT HEART CATH AND CORONARY ANGIOGRAPHY N/A 06/05/2022   Procedure: LEFT HEART CATH AND CORONARY ANGIOGRAPHY;  Surgeon: Lyn Records, MD;  Location: [facility];  Service: Cardiovascular;  Laterality: N/A;   TONSILLECTOMY      Allergies: Allergies as of 02/24/2023 - Review Complete 12/03/2022  Allergen Reaction Noted   Shrimp [shellfish allergy] Anaphylaxis 08/05/2011   Duricef [cefadroxil] Diarrhea, Rash, and Other (See Comments) 06/05/2022   Penicillins Hives 11/30/2013   Zosyn [piperacillin sod-tazobactam so] Hives and Itching 04/01/2012   Clindamycin Itching and Nausea Only 03/30/2022    Medications: Outpatient Encounter Medications as of 02/24/2023  Medication Sig   ADVAIR DISKUS 250-50 MCG/DOSE AEPB Inhale 1 puff into the lungs 2 (two) times daily.    ARIPiprazole (ABILIFY) 10 MG tablet Take 10 mg by mouth every morning.   buPROPion (WELLBUTRIN XL) 150 MG 24 hr tablet Take 450  mg by mouth daily.   cefadroxil (DURICEF) 500 MG capsule Take 1 capsule (500 mg total) by mouth 2 (two) times daily.   CIMZIA PREFILLED 2 X 200 MG/ML PSKT Inject 200 mg into the skin every 14 (fourteen) days.   clonazePAM (KLONOPIN) 2 MG tablet Take 2 mg by mouth at bedtime.   DULoxetine (CYMBALTA) 60 MG capsule Take 60 mg by mouth daily.   fluticasone (FLONASE) 50 MCG/ACT nasal spray Place 1 spray into both nostrils daily as needed for allergies.   fluticasone (FLOVENT HFA) 44 MCG/ACT inhaler Inhale 1 puff into the lungs daily as needed (for shortness of  breath).   gabapentin (NEURONTIN) 100 MG capsule 400 mg 2 (two) times daily.   hydroxychloroquine (PLAQUENIL) 200 MG tablet Take 200 mg by mouth 2 (two) times daily.   ipratropium (ATROVENT) 0.03 % nasal spray Place 2 sprays into both nostrils every 12 (twelve) hours.   ketorolac (TORADOL) 10 MG tablet Take 1 tablet (10 mg total) by mouth every 6 (six) hours as needed.   levalbuterol (XOPENEX HFA) 45 MCG/ACT inhaler Inhale 2 puffs into the lungs 3 (three) times daily as needed for shortness of breath.   levalbuterol (XOPENEX) 0.63 MG/3ML nebulizer solution Take 3 mLs (0.63 mg total) by nebulization every 6 (six) hours as needed for wheezing or shortness of breath.   levocetirizine (XYZAL) 5 MG tablet Take 5 mg by mouth every evening.   levonorgestrel (MIRENA, 52 MG,) 20 MCG/DAY IUD Mirena 20 mcg/24 hours (7 yrs) 52 mg intrauterine device  Take 1 device by intrauterine route.   montelukast (SINGULAIR) 10 MG tablet Take 10 mg by mouth at bedtime.   omeprazole (PRILOSEC) 20 MG capsule Take 20 mg by mouth every evening.   SUMAtriptan (IMITREX) 100 MG tablet Take 100 mg by mouth every 2 (two) hours as needed for migraine.   SYMBICORT 160-4.5 MCG/ACT inhaler Inhale 2 puffs into the lungs daily.   traZODone (DESYREL) 100 MG tablet Take 100 mg by mouth at bedtime.   Vitamin D, Ergocalciferol, (DRISDOL) 1.25 MG (50000 UNIT) CAPS capsule Take 50,000 Units by mouth every Sunday.   No facility-administered encounter medications on file as of 02/24/2023.    Social History: Social History   Tobacco Use   Smoking status: Never   Smokeless tobacco: Never  Vaping Use   Vaping Use: Never used  Substance Use Topics   Alcohol use: Not Currently   Drug use: No    Family Medical History: Family History  Adopted: Yes  Family history unknown: Yes    Physical Examination: There were no vitals filed for this visit.  General: Patient is well developed, well nourished, calm, collected, and in no  apparent distress. Attention to examination is appropriate.  Respiratory: Patient is breathing without any difficulty.   NEUROLOGICAL:     Awake, alert, oriented to person, place, and time.  Speech is clear and fluent. Fund of knowledge is appropriate.   Cranial Nerves: Pupils equal round and reactive to light.  Facial tone is symmetric.    *** ROM of cervical spine *** pain *** posterior cervical tenderness. *** tenderness in bilateral trapezial region.   *** ROM of lumbar spine *** pain *** posterior lumbar tenderness.   No abnormal lesions on exposed skin.   Strength: Side Biceps Triceps Deltoid Interossei Grip Wrist Ext. Wrist Flex.  R L Side Iliopsoas Quads Hamstring PF DF  EHL  R 5 5 5 5 5 5   L 5 5 5 5 5 5    Reflexes are ***2+ and symmetric at the biceps, triceps, brachioradialis, patella and achilles.   Hoffman's is absent.  Clonus is not present.   Bilateral upper and lower extremity sensation is intact to light touch.     Gait is normal.   ***No difficulty with tandem gait.    Medical Decision Making  Imaging: Lumbar MRI dated 11/06/17:  FINDINGS:  # Vertebral bodies: No compression fracture.  #  Alignment: Minimal spondylolisthesis L3-4.  #  Marrow signal: No significant abnormality.  #  Conus medullaris: Normal. Terminates at L1 with no evidence of tethering.  #  Lower thoracic segments: No significant abnormality.   #  L1-2: Normal.  #  L2-3: Mild degenerative disc disease. Moderate facet joint arthritis. No spinal or foraminal stenosis.  #  L3-4: Mild degenerative disc disease. Moderately severe facet joint arthritis. Minimal grade 1 spondylolisthesis. There is some thickening of the ligamentum flavum. Mild spinal stenosis.  #  L4-5: Mild degenerative disc disease. Moderate facet joint arthritis. No spinal or foraminal stenosis.  #  L5-S1: Mild degenerative disc disease. Small disc protrusion on the left with impingement on  and slight displacement of the left S1 nerve root.    IMPRESSION:  1. Small disc protrusion on the left at L5-S1 with impingement on the left S1 nerve root.  2.   Minimal grade 1 spondylolisthesis L3-4 associated with mild spinal stenosis.   Electronically Signed by: Orlean Patten   I have personally reviewed the images and agree with the above interpretation.  Assessment and Plan: Ms. Reves is a pleasant 52 y.o. female has ***  Treatment options discussed with patient and following plan made:   - Order for physical therapy for *** spine ***. Patient to call to schedule appointment. *** - Continue current medications including ***. Reviewed dosing and side effects.  - Prescription for ***. Reviewed dosing and side effects. Take with food.  - Prescription for *** to take prn muscle spasms. Reviewed dosing and side effects. Discussed this can cause drowsiness.  - MRI of *** to further evaluate *** radiculopathy. No improvement time or medications (***).  - Referral to PMR at [facility] to discuss possible *** injections.  - Will schedule phone visit to review MRI results once I get them back.   I spent a total of *** minutes in face-to-face and non-face-to-face activities related to this patient's care today including review of outside records, review of imaging, review of symptoms, physical exam, discussion of differential diagnosis, discussion of treatment options, and documentation.   Thank you for involving me in the care of this patient.   Drake Leach PA-C Dept. of Neurosurgery

## 2023-02-24 ENCOUNTER — Ambulatory Visit: Payer: 59 | Admitting: Orthopedic Surgery

## 2023-02-26 ENCOUNTER — Telehealth: Payer: Self-pay | Admitting: Family Medicine

## 2023-02-26 NOTE — Telephone Encounter (Signed)
LVM for patient to call back to schedule apt with PCP. AS, CMA 

## 2023-03-11 DIAGNOSIS — Z419 Encounter for procedure for purposes other than remedying health state, unspecified: Secondary | ICD-10-CM | POA: Diagnosis not present

## 2023-03-23 ENCOUNTER — Ambulatory Visit: Payer: 59

## 2023-04-11 DIAGNOSIS — Z419 Encounter for procedure for purposes other than remedying health state, unspecified: Secondary | ICD-10-CM | POA: Diagnosis not present

## 2023-04-17 NOTE — Progress Notes (Unsigned)
Referring Physician:  Park Meo, FNP [address] [place],  [place] [address]  Primary Physician:  Park Meo, FNP  History of Present Illness: 04/17/2023*** [name] has a history of asthma, depression, GERD, HTN, obesity, DM, and FM.   History of gastric sleeve.   She sees pain management at [facility]. They discussed bilateral SI joint injections and bilateral L3-S1 MBB to RFA.   Duration: *** Location: *** Quality: *** Severity: ***  Precipitating: aggravated by *** Modifying factors: made better by *** Weakness: none Timing: *** Bowel/Bladder Dysfunction: none  Conservative measures:  Physical therapy: aquatic PT started on 03/02/23***  Multimodal medical therapy including regular antiinflammatories: diclofenac, robaxin, cymbalta, neurontin, celebrex Injections: *** epidural steroid injections  Past Surgery: ***  Michele Craig has ***no symptoms of cervical myelopathy.  The symptoms are causing a significant impact on the patient's life.   Review of Systems:  A 10 point review of systems is negative, except for the pertinent positives and negatives detailed in the HPI.  Past Medical History: Past Medical History:  Diagnosis Date   Anemia    Anxiety    Arthritis    Complication of anesthesia    with carpal tunnel syndrome the block didn't work well, she could feel things during the surgery   Depression    Family history of adverse reaction to anesthesia    unknown, pt is adopted   Fibromyalgia    Foot pain    GERD (gastroesophageal reflux disease)    Headache    Hypertension    IBS (irritable bowel syndrome)    after cholecystectomy and then it went away after Gastric sleeve   Moderate persistent asthma    OSA (obstructive sleep apnea)    does not use cpap   RA (rheumatoid arthritis) (HCC)    Shortness of breath dyspnea    only when her asthma is flaring up   Type 2 diabetes mellitus (HCC)    pt states after her gastric  sleeve procedure she is no longer on medications and is not checked for it    Past Surgical History: Past Surgical History:  Procedure Laterality Date   CARPAL TUNNEL RELEASE     CARPAL TUNNEL RELEASE Right 04/04/2016   Procedure: RIGHT CARPAL TUNNEL RELEASE;  Surgeon: Frederico Hamman, MD;  Location: [facility] OR;  Service: Orthopedics;  Laterality: Right;  Synovectomy    CESAREAN SECTION     x 2   CHOLECYSTECTOMY     deuodenal switch     DILATION AND CURETTAGE OF UTERUS     LAPAROSCOPIC GASTRIC SLEEVE RESECTION     LEFT HEART CATH AND CORONARY ANGIOGRAPHY N/A 06/05/2022   Procedure: LEFT HEART CATH AND CORONARY ANGIOGRAPHY;  Surgeon: Lyn Records, MD;  Location: [facility] INV[facility];  Service: Cardiovascular;  Laterality: N/A;   TONSILLECTOMY      Allergies: Allergies as of 04/22/2023 - Review Complete 12/03/2022  Allergen Reaction Noted   Shrimp [shellfish allergy] Anaphylaxis 08/05/2011   Duricef [cefadroxil] Diarrhea, Rash, and Other (See Comments) 06/05/2022   Penicillins Hives 11/30/2013   Zosyn [piperacillin sod-tazobactam so] Hives and Itching 04/01/2012   Clindamycin Itching and Nausea Only 03/30/2022    Medications: Outpatient Encounter Medications as of 04/22/2023  Medication Sig   ADVAIR DISKUS 250-50 MCG/DOSE AEPB Inhale 1 puff into the lungs 2 (two) times daily.    ARIPiprazole (ABILIFY) 10 MG tablet Take 10 mg by mouth every morning.   buPROPion [facility]  XL) 150 MG 24 hr tablet Take 450 mg by mouth daily.   cefadroxil (DURICEF) 500 MG capsule Take 1 capsule (500 mg total) by mouth 2 (two) times daily.   CIMZIA PREFILLED 2 X 200 MG/ML PSKT Inject 200 mg into the skin every 14 (fourteen) days.   clonazePAM (KLONOPIN) 2 MG tablet Take 2 mg by mouth at bedtime.   DULoxetine (CYMBALTA) 60 MG capsule Take 60 mg by mouth daily.   fluticasone (FLONASE) 50 MCG/ACT nasal spray Place 1 spray into both nostrils daily as needed for allergies.   fluticasone (FLOVENT HFA) 44 MCG/ACT  inhaler Inhale 1 puff into the lungs daily as needed (for shortness of breath).   gabapentin (NEURONTIN) 100 MG capsule 400 mg 2 (two) times daily.   hydroxychloroquine (PLAQUENIL) 200 MG tablet Take 200 mg by mouth 2 (two) times daily.   ipratropium (ATROVENT) 0.03 % nasal spray Place 2 sprays into both nostrils every 12 (twelve) hours.   ketorolac (TORADOL) 10 MG tablet Take 1 tablet (10 mg total) by mouth every 6 (six) hours as needed.   levalbuterol (XOPENEX HFA) 45 MCG/ACT inhaler Inhale 2 puffs into the lungs 3 (three) times daily as needed for shortness of breath.   levalbuterol (XOPENEX) 0.63 MG/3ML nebulizer solution Take 3 mLs (0.63 mg total) by nebulization every 6 (six) hours as needed for wheezing or shortness of breath.   levocetirizine (XYZAL) 5 MG tablet Take 5 mg by mouth every evening.   levonorgestrel (MIRENA, 52 MG,) 20 MCG/DAY IUD Mirena 20 mcg/24 hours (7 yrs) 52 mg intrauterine device  Take 1 device by intrauterine route.   montelukast (SINGULAIR) 10 MG tablet Take 10 mg by mouth at bedtime.   omeprazole (PRILOSEC) 20 MG capsule Take 20 mg by mouth every evening.   SUMAtriptan (IMITREX) 100 MG tablet Take 100 mg by mouth every 2 (two) hours as needed for migraine.   SYMBICORT 160-4.5 MCG/ACT inhaler Inhale 2 puffs into the lungs daily.   traZODone (DESYREL) 100 MG tablet Take 100 mg by mouth at bedtime.   Vitamin D, Ergocalciferol, (DRISDOL) 1.25 MG (50000 UNIT) CAPS capsule Take 50,000 Units by mouth every Sunday.   No facility-administered encounter medications on file as of 04/22/2023.    Social History: Social History   Tobacco Use   Smoking status: Never   Smokeless tobacco: Never  Vaping Use   Vaping Use: Never used  Substance Use Topics   Alcohol use: Not Currently   Drug use: No    Family Medical History: Family History  Adopted: Yes  Family history unknown: Yes    Physical Examination: There were no vitals filed for this  visit.  General: Patient is well developed, well nourished, calm, collected, and in no apparent distress. Attention to examination is appropriate.  Respiratory: Patient is breathing without any difficulty.   NEUROLOGICAL:     Awake, alert, oriented to person, place, and time.  Speech is clear and fluent. Fund of knowledge is appropriate.   Cranial Nerves: Pupils equal round and reactive to light.  Facial tone is symmetric.    *** ROM of cervical spine *** pain *** posterior cervical tenderness. *** tenderness in bilateral trapezial region.   *** ROM of lumbar spine *** pain *** posterior lumbar tenderness.   No abnormal lesions on exposed skin.   Strength: Side Biceps Triceps Deltoid Interossei Grip Wrist Ext. Wrist Flex.  R 5 5 5 5 5 5 5   L 5 5 5 5 5 5  5  Side Iliopsoas Quads Hamstring PF DF EHL  R 5 5 5 5 5 5   L 5 5 5 5 5 5    Reflexes are ***2+ and symmetric at the biceps, triceps, brachioradialis, patella and achilles.   Hoffman's is absent.  Clonus is not present.   Bilateral upper and lower extremity sensation is intact to light touch.     Gait is normal.   ***No difficulty with tandem gait.    Medical Decision Making  Imaging: MRI of lumbar spine dated 11/05/17:  FINDINGS: #  Vertebral bodies: No compression fracture. #  Alignment: Minimal spondylolisthesis L3-4. #  Marrow signal: No significant abnormality. #  Conus medullaris: Normal. Terminates at L1 with no evidence of tethering. #  Lower thoracic segments: No significant abnormality.  #  L1-2: Normal. #  L2-3: Mild degenerative disc disease. Moderate facet joint arthritis. No spinal or foraminal stenosis. #  L3-4: Mild degenerative disc disease. Moderately severe facet joint arthritis. Minimal grade 1 spondylolisthesis. There is some thickening of the ligamentum flavum. Mild spinal stenosis. #  L4-5: Mild degenerative disc disease. Moderate facet joint arthritis. No spinal or foraminal stenosis. #   L5-S1: Mild degenerative disc disease. Small disc protrusion on the left with impingement on and slight displacement of the left S1 nerve root.   IMPRESSION: 1.  Small disc protrusion on the left at L5-S1 with impingement on the left S1 nerve root. 2.   Minimal grade 1 spondylolisthesis L3-4 associated with mild spinal stenosis.  Electronically Signed by: Orlean Patten Specimen Collected: -- Last Resulted: --  Date: 11/06/17   Received From: [facility]    I have personally reviewed the images and agree with the above interpretation.  Assessment and Plan: Ms. Coppa is a pleasant 52 y.o. female has ***  Treatment options discussed with patient and following plan made:   - Order for physical therapy for *** spine ***. Patient to call to schedule appointment. *** - Continue current medications including ***. Reviewed dosing and side effects.  - Prescription for ***. Reviewed dosing and side effects. Take with food.  - Prescription for *** to take prn muscle spasms. Reviewed dosing and side effects. Discussed this can cause drowsiness.  - MRI of *** to further evaluate *** radiculopathy. No improvement time or medications (***).  - Referral to PMR at [facility] to discuss possible *** injections.  - Will schedule phone visit to review MRI results once I get them back.   I spent a total of *** minutes in face-to-face and non-face-to-face activities related to this patient's care today including review of outside records, review of imaging, review of symptoms, physical exam, discussion of differential diagnosis, discussion of treatment options, and documentation.   Thank you for involving me in the care of this patient.   Drake Leach PA-C Dept. of Neurosurgery

## 2023-04-22 ENCOUNTER — Encounter: Payer: Self-pay | Admitting: Orthopedic Surgery

## 2023-04-22 ENCOUNTER — Ambulatory Visit (INDEPENDENT_AMBULATORY_CARE_PROVIDER_SITE_OTHER): Payer: 59 | Admitting: Orthopedic Surgery

## 2023-04-22 VITALS — BP 108/69 | HR 79 | Ht 67.0 in | Wt 331.6 lb

## 2023-04-22 DIAGNOSIS — M4722 Other spondylosis with radiculopathy, cervical region: Secondary | ICD-10-CM

## 2023-04-22 DIAGNOSIS — M25511 Pain in right shoulder: Secondary | ICD-10-CM

## 2023-04-22 DIAGNOSIS — M4316 Spondylolisthesis, lumbar region: Secondary | ICD-10-CM | POA: Diagnosis not present

## 2023-04-22 DIAGNOSIS — M4726 Other spondylosis with radiculopathy, lumbar region: Secondary | ICD-10-CM | POA: Diagnosis not present

## 2023-04-22 DIAGNOSIS — M47816 Spondylosis without myelopathy or radiculopathy, lumbar region: Secondary | ICD-10-CM

## 2023-04-22 DIAGNOSIS — M5412 Radiculopathy, cervical region: Secondary | ICD-10-CM

## 2023-04-22 DIAGNOSIS — M47812 Spondylosis without myelopathy or radiculopathy, cervical region: Secondary | ICD-10-CM

## 2023-04-22 NOTE — Patient Instructions (Signed)
It was so nice to see you today. Thank you so much for coming in.    You have some wear and tear (arthritis) in your neck and back and this is likely what is causing your pain. Some of the pain in the right shoulder is likely coming from the shoulder as well.   I want to get an MRI of your neck and back to look into things further. We will get this approved through your insurance and [facility] will call you to schedule the appointment. Make sure you are scheduled for the wide bore MRI.   It will take 5-7 days for me to get the results from your MRI. Once I have them, we will call you to schedule a follow up with me to review them. We can do a phone visit (I call you) or you can come in and see me. Either way is fine with me.   Please do not hesitate to call if you have any questions or concerns. You can also message me in MyChart.   If you have not heard back about any of the MRI scans in the next week, please call the office so we can help you get them scheduled.   Drake Leach PA-C [phone]

## 2023-05-10 ENCOUNTER — Ambulatory Visit
Admission: RE | Admit: 2023-05-10 | Discharge: 2023-05-10 | Disposition: A | Discharge status: Home / Self Care | Payer: 59 | Source: Ambulatory Visit | Attending: Orthopedic Surgery | Admitting: Orthopedic Surgery

## 2023-05-10 DIAGNOSIS — M4316 Spondylolisthesis, lumbar region: Secondary | ICD-10-CM

## 2023-05-10 DIAGNOSIS — M5412 Radiculopathy, cervical region: Secondary | ICD-10-CM

## 2023-05-10 DIAGNOSIS — M47816 Spondylosis without myelopathy or radiculopathy, lumbar region: Secondary | ICD-10-CM

## 2023-05-10 DIAGNOSIS — M47812 Spondylosis without myelopathy or radiculopathy, cervical region: Secondary | ICD-10-CM

## 2023-05-11 DIAGNOSIS — Z419 Encounter for procedure for purposes other than remedying health state, unspecified: Secondary | ICD-10-CM | POA: Diagnosis not present

## 2023-05-18 NOTE — Progress Notes (Unsigned)
Telephone Visit- Progress Note: Referring Physician:  Park Meo, FNP [address] [place],  [place] [address]  Primary Physician:  Park Meo, FNP  This visit was performed via telephone.  Patient location: home Provider location: office  I spent a total of 15 minutes non-face-to-face activities for this visit on the date of this encounter including review of current clinical condition and response to treatment.    Patient has given verbal consent to this telephone visits and we reviewed the limitations of a telephone visit. Patient wishes to proceed.    Chief Complaint:  review MRI results  History of Present Illness: Michele Craig is a 52 y.o. female has a history of asthma, depression, GERD, HTN, obesity, DM, and FM.    History of gastric sleeve.   Last seen by me on 04/22/23 for neck and right arm pain/weakness. She has known cervical spondylosis.   She was also seen for chronic LBP/buttock pain with no leg pain. MRI from 2018 showed slip at L3-L4 with mild central stenosis and left sided disc bulge L5-S1.   PT difficult due to transportation issues. Weight loss discussed and encouraged. MRIs of cervical and lumbar spine were ordered.   Phone visit scheduled to review her MRI results.   Her symptoms are the mostly the same since her last visit.   She continues with constant right sided neck pain that radiates down front of her right arm to entire hand. She has numbness/tingling in wrist that radiates up the arm. She has weakness in right arm. She is dropping things. She has issues with hand dexterity and notes balance issues for last few years- she feels unsteady.     She has also continues with chronic constant low back pain into bilateral buttocks, no leg pain. Pain is worse with walking. No numbness or tingling. She notes weakness in both legs.    Bowel/Bladder Dysfunction: none   She does not smoke.    Last HgBA1c was 5.4 on 12/02/22.     Conservative measures:  Physical therapy: has not participated, was given script for aquatic PT but transportation was an issue.  Multimodal medical therapy including regular antiinflammatories: diclofenac, robaxin, cymbalta, neurontin, celebrex, cyclobenzaprine, oxycodone Injections: has not received epidural steroid injections   Past Surgery: no previous spine surgery   Michele Craig has symptoms of cervical myelopathy. She notes dexterity issues, dropping things, and balance issues.    The symptoms are causing a significant impact on the patient's life.    Exam: No exam done as this was a telephone encounter.     Imaging: Cervical MRI dated 05/10/23:  FINDINGS: Alignment: No significant listhesis.   Vertebrae: No acute fracture, evidence of discitis, or suspicious osseous lesion. Congenitally short pedicles, which narrow the AP diameter of the spinal canal.   Cord: Normal signal and morphology.  Cord flattening at C4-C5.   Posterior Fossa, vertebral arteries, paraspinal tissues: Negative.   Disc levels:   C2-C3: No significant disc bulge. No spinal canal stenosis or neural foraminal narrowing.   C3-C4: Minimal disc bulge. Ligamentum flavum hypertrophy. Facet arthropathy. Mild-to-moderate spinal canal stenosis. Mild right neural foraminal narrowing.   C4-C5: Mild disc bulge with superimposed central disc protrusion, which indents and deforms the ventral spinal cord. Ligamentum flavum hypertrophy. Facet and uncovertebral hypertrophy. Severe spinal canal stenosis. Mild right neural foraminal narrowing.   C5-C6: Minimal disc bulge with central protrusion. Ligamentum flavum hypertrophy. Facet and uncovertebral hypertrophy. Moderate spinal canal stenosis.  Moderate right neural foraminal narrowing.   C6-C7: Minimal disc bulge with small central protrusion. No spinal canal stenosis or neural foraminal narrowing.   C7-T1: Minimal disc bulge. Facet and uncovertebral  hypertrophy. No spinal canal stenosis. Mild left and moderate right neural foraminal narrowing.   IMPRESSION: 1. C4-C5 severe spinal canal stenosis and mild right neural foraminal narrowing. There is flattening of the spinal cord at this level, but no abnormal cord signal. 2. C5-C6 moderate spinal canal stenosis and moderate right neural foraminal narrowing. 3. C3-C4 mild-to-moderate spinal canal stenosis and mild right neural foraminal narrowing. 4. C7-T1 moderate right and mild left neural foraminal narrowing.     Electronically Signed   By: Wiliam Ke M.D.   On: 05/16/2023 20:51    MRI of lumbar spine dated 05/10/23:  FINDINGS: Segmentation: In correlation with the 03/30/2022 CT, there is partial sacralization of L5 the right. The last fully formed disc space is labeled L5-S1. 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   Alignment: Dextrocurvature. 3 mm anterolisthesis of L3 on L4. Mild right lateral listhesis of L3 on L4 and L4 on L5 is also noted on the prior CT.   Vertebrae: No acute fracture, evidence of discitis, or suspicious osseous lesion. Endplate degenerative changes, most prominently at L4-L5. Congenitally short pedicles, which narrow the AP diameter of the spinal canal.   Conus medullaris and cauda equina: Conus extends to the L1 level. Conus and cauda equina appear normal.   Paraspinal and other soft tissues: T2 hyperintense right adnexal cyst measures up to 3.9 cm (series 206, image 37 and series 203, image 5) and does not demonstrate internal septations; no further imaging is recommended. Some fatty atrophy of the inferior paraspinous muscles.   Disc levels:   T12-L1: No significant disc bulge. No spinal canal stenosis or neural foraminal narrowing.   L1-L2: No significant disc bulge. Mild facet arthropathy. No spinal canal stenosis or neural foraminal narrowing.   L2-L3: Mild disc bulge, somewhat eccentric to the right. Moderate left and mild right  facet arthropathy. No spinal canal stenosis. Mild right neural foraminal narrowing, new from the prior.   L3-L4: Grade 1 anterolisthesis with disc unroofing and mild disc bulge. Moderate to severe facet arthropathy. Ligamentum flavum hypertrophy. Narrowing of the lateral recesses. Moderate spinal canal stenosis, with mild left and moderate right neural foraminal narrowing, which have progressed from the prior exam.   L4-L5: Mild disc bulge with right extreme lateral protrusion. Moderate facet arthropathy narrowing of the lateral recesses. No spinal canal stenosis. Mild left neural foraminal narrowing, which has progressed from the prior exam.   L5-S1: No significant disc bulge. No spinal canal stenosis or neural foraminal narrowing.   IMPRESSION: 1. L3-L4 moderate spinal canal stenosis with mild left and moderate right neural foraminal narrowing, which have progressed from the prior exam. 2. L4-L5 mild left neural foraminal narrowing. 3. L2-L3 mild right neural foraminal narrowing. 4. Narrowing of the lateral recesses at L3-L4 and L4-L5 could affect the descending L4 and L5 nerve roots, respectively.     Electronically Signed   By: Wiliam Ke M.D.   On: 05/16/2023 21:15  I have personally reviewed the images and agree with the above interpretation.  Assessment and Plan: Ms. Beechler is a pleasant 52 y.o. female who continues with constant right sided neck pain that radiates down front of her right arm to entire hand. She is dropping things. She has issues with hand dexterity and notes balance issues for last few years- she  feels unsteady.     She has severe central stenosis C4-C5, moderate central and right foraminal stenosis C5-C6, mild/moderate central stenosis C3-C4, and moderate right foraminal stenosis C7-T1.    She has also continues with chronic constant low back pain into bilateral buttocks, no leg pain. Pain is worse with walking. No numbness or tingling. She notes  weakness in both legs.    Known slip at L3-L4 with severe facet hypertrophy and moderate central stenosis, moderate facet hypertrophy L4-L5, and diffuse DDD and multilevel foraminal stenosis.    Treatment options discussed with patient and following plan made:   - She has severe central stenosis C4-C5 along with dexterity issues, dropping things, and balance issues. Recommend she follow up with Dr. Myer Haff to discuss further treatment options for cervical myelopathy.  - Will hold on further treatment of lumbar spine for now, may revisit PT (transportation issues) or consider referral for lumbar injections.  - She will f/u with Dr. Myer Haff as scheduled.   Drake Leach PA-C Neurosurgery

## 2023-05-19 ENCOUNTER — Encounter: Payer: Self-pay | Admitting: Orthopedic Surgery

## 2023-05-19 ENCOUNTER — Ambulatory Visit (INDEPENDENT_AMBULATORY_CARE_PROVIDER_SITE_OTHER): Payer: 59 | Admitting: Orthopedic Surgery

## 2023-05-19 DIAGNOSIS — M4712 Other spondylosis with myelopathy, cervical region: Secondary | ICD-10-CM

## 2023-05-19 DIAGNOSIS — M4802 Spinal stenosis, cervical region: Secondary | ICD-10-CM | POA: Diagnosis not present

## 2023-05-19 DIAGNOSIS — M4316 Spondylolisthesis, lumbar region: Secondary | ICD-10-CM

## 2023-05-19 DIAGNOSIS — M5136 Other intervertebral disc degeneration, lumbar region: Secondary | ICD-10-CM

## 2023-05-19 DIAGNOSIS — M4722 Other spondylosis with radiculopathy, cervical region: Secondary | ICD-10-CM

## 2023-05-19 DIAGNOSIS — M47816 Spondylosis without myelopathy or radiculopathy, lumbar region: Secondary | ICD-10-CM

## 2023-05-19 DIAGNOSIS — M48061 Spinal stenosis, lumbar region without neurogenic claudication: Secondary | ICD-10-CM

## 2023-05-19 DIAGNOSIS — M5412 Radiculopathy, cervical region: Secondary | ICD-10-CM

## 2023-05-19 DIAGNOSIS — M47812 Spondylosis without myelopathy or radiculopathy, cervical region: Secondary | ICD-10-CM

## 2023-05-29 NOTE — Progress Notes (Unsigned)
Referring Physician:  Park Meo, FNP [address] [place],  [place] [address]  Primary Physician:  Park Meo, FNP  History of Present Illness: 06/04/2023 [name] is here today with a chief complaint of neck pain extending into her right hand.  She is also having weakness in her right hand.  She has trouble handling items with either of her arms.  She has to use both arms to pick up things that she previously did not have to.  She is having dexterity issues and changes in her handwriting.  She previously had carpal tunnel release which helped.  Her symptoms now are slightly different than her carpal tunnel syndrome.   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  Saw Drake Leach PA on 04/22/23  History of Present Illness: 04/22/2023 [name] has a history of asthma, depression, GERD, HTN, obesity, DM, and FM.    History of gastric sleeve.    She sees pain management at [facility]. They discussed bilateral SI joint injections and bilateral L3-S1 MBB to RFA.    She has chronic constant low back pain into bilateral buttocks, no leg pain. Pain is worse with walking. Some improvement with laying flat. No numbness or tingling. She notes weakness in both legs.    She had heart cath in 2023, since then she has constant right sided neck pain that radiates down front of her right arm to entire hand. She has numbness/tingling in wrist that radiates up the arm. She has weakness in right arm. No left arm symptoms. Pain is worse with using her arm or moving her neck.    Bowel/Bladder Dysfunction: none   She does not smoke.    Last  HgBA1c was 5.4 on 12/02/22.     The symptoms are causing a significant impact on the patient's life.   I have utilized the care everywhere function in epic to review the outside records available from external health systems.  Review of Systems:  A 10 point review of systems is negative, except for the pertinent positives and negatives detailed in the HPI.  Past Medical History: Past Medical History:  Diagnosis Date   Anemia    Anxiety    Arthritis    Complication of anesthesia    with carpal tunnel syndrome the block didn't work well, she could feel things during the surgery   Depression    Family history of adverse reaction to anesthesia    unknown, pt is adopted   Fibromyalgia    Foot pain    GERD (gastroesophageal reflux disease)    Headache    Hypertension    IBS (irritable bowel syndrome)    after cholecystectomy and then it went away after Gastric sleeve   Moderate persistent asthma    OSA (obstructive sleep apnea)    does not use cpap   RA (rheumatoid arthritis) (HCC)    Shortness of breath dyspnea    only when her asthma is flaring up   Type 2 diabetes mellitus (HCC)    pt states after her gastric sleeve procedure she is no longer on medications and is not checked for it    Past Surgical History: Past Surgical History:  Procedure Laterality Date   CARPAL TUNNEL RELEASE  CARPAL TUNNEL RELEASE Right 04/04/2016   Procedure: RIGHT CARPAL TUNNEL RELEASE;  Surgeon: Frederico Hamman, MD;  Location: [facility] OR;  Service: Orthopedics;  Laterality: Right;  Synovectomy    CESAREAN SECTION     x 2   CHOLECYSTECTOMY     deuodenal switch     DILATION AND CURETTAGE OF UTERUS     LAPAROSCOPIC GASTRIC SLEEVE RESECTION     LEFT HEART CATH AND CORONARY ANGIOGRAPHY N/A 06/05/2022   Procedure: LEFT HEART CATH AND CORONARY ANGIOGRAPHY;  Surgeon: Lyn Records, MD;  Location: [facility] INVASIVE CV LAB;  Service: Cardiovascular;  Laterality: N/A;   TONSILLECTOMY      Allergies: Allergies  as of 06/04/2023 - Review Complete 06/04/2023  Allergen Reaction Noted   Penicillins Hives, Itching, Rash, and Other (See Comments) 04/01/2012   Shellfish allergy Anaphylaxis and Other (See Comments) 08/05/2011   Cefadroxil Diarrhea, Other (See Comments), Rash, and Hives 06/05/2022   Zosyn [piperacillin sod-tazobactam so] Hives and Itching 04/01/2012   Clindamycin hcl Other (See Comments) 04/22/2023   Clindamycin Itching, Nausea Only, Other (See Comments), and Rash 03/30/2022    Medications:  Current Outpatient Medications:    ADVAIR DISKUS 250-50 MCG/DOSE AEPB, Inhale 1 puff into the lungs 2 (two) times daily. , Disp: , Rfl:    albuterol (VENTOLIN HFA) 108 (90 Base) MCG/ACT inhaler, Inhale 2 puffs into the lungs every 4 (four) hours as needed., Disp: , Rfl:    ARIPiprazole (ABILIFY) 10 MG tablet, Take 10 mg by mouth every morning., Disp: , Rfl:    buPROPion (WELLBUTRIN XL) 150 MG 24 hr tablet, Take 450 mg by mouth daily., Disp: , Rfl:    cefadroxil (DURICEF) 500 MG capsule, Take 1 capsule (500 mg total) by mouth 2 (two) times daily., Disp: 14 capsule, Rfl: 0   celecoxib (CELEBREX) 50 MG capsule, Take 50 mg by mouth 2 (two) times daily., Disp: , Rfl:    CIMZIA PREFILLED 2 X 200 MG/ML PSKT, Inject 200 mg into the skin every 14 (fourteen) days., Disp: , Rfl:    clonazePAM (KLONOPIN) 2 MG tablet, Take 2 mg by mouth at bedtime., Disp: , Rfl:    DULoxetine (CYMBALTA) 60 MG capsule, Take 60 mg by mouth daily., Disp: , Rfl:    fluticasone (FLONASE) 50 MCG/ACT nasal spray, Place 1 spray into both nostrils daily as needed for allergies., Disp: , Rfl:    fluticasone (FLOVENT HFA) 44 MCG/ACT inhaler, Inhale 1 puff into the lungs daily as needed (for shortness of breath)., Disp: , Rfl:    gabapentin (NEURONTIN) 100 MG capsule, 400 mg 2 (two) times daily., Disp: , Rfl:    hydroxychloroquine (PLAQUENIL) 200 MG tablet, Take 200 mg by mouth 2 (two) times daily., Disp: , Rfl:    ipratropium (ATROVENT) 0.03  % nasal spray, Place 2 sprays into both nostrils every 12 (twelve) hours., Disp: 30 mL, Rfl: 12   ketorolac (TORADOL) 10 MG tablet, Take 1 tablet (10 mg total) by mouth every 6 (six) hours as needed., Disp: 20 tablet, Rfl: 0   levalbuterol (XOPENEX HFA) 45 MCG/ACT inhaler, Inhale 2 puffs into the lungs 3 (three) times daily as needed for shortness of breath., Disp: , Rfl:    levalbuterol (XOPENEX) 0.63 MG/3ML nebulizer solution, Take 3 mLs (0.63 mg total) by nebulization every 6 (six) hours as needed for wheezing or shortness of breath., Disp: 30 mL, Rfl: 5   levocetirizine (XYZAL) 5 MG tablet, Take 5 mg by mouth every evening., Disp: , Rfl:  levonorgestrel (MIRENA, 52 MG,) 20 MCG/DAY IUD, Mirena 20 mcg/24 hours (7 yrs) 52 mg intrauterine device  Take 1 device by intrauterine route., Disp: , Rfl:    methocarbamol (ROBAXIN) 500 MG tablet, Take 1 tablet by mouth 3 (three) times daily as needed., Disp: , Rfl:    montelukast (SINGULAIR) 10 MG tablet, Take 10 mg by mouth at bedtime., Disp: , Rfl:    omeprazole (PRILOSEC) 20 MG capsule, Take 20 mg by mouth every evening., Disp: , Rfl:    SUMAtriptan (IMITREX) 100 MG tablet, Take 100 mg by mouth every 2 (two) hours as needed for migraine., Disp: , Rfl:    SYMBICORT 160-4.5 MCG/ACT inhaler, Inhale 2 puffs into the lungs daily., Disp: , Rfl:    traZODone (DESYREL) 100 MG tablet, Take 100 mg by mouth at bedtime., Disp: , Rfl:    Vitamin D, Ergocalciferol, (DRISDOL) 1.25 MG (50000 UNIT) CAPS capsule, Take 50,000 Units by mouth every Sunday., Disp: , Rfl:   Social History: Social History   Tobacco Use   Smoking status: Never   Smokeless tobacco: Never  Vaping Use   Vaping status: Never Used  Substance Use Topics   Alcohol use: Not Currently   Drug use: No    Family Medical History: Family History  Adopted: Yes  Family history unknown: Yes    Physical Examination: Vitals:   06/04/23 0951  BP: 126/82    General: Patient is in no apparent  distress. Attention to examination is appropriate.  Neck:   Supple.  Full range of motion.  She does have numbness that occurs when she turns her head to the right.  Respiratory: Patient is breathing without any difficulty.   NEUROLOGICAL:     Awake, alert, oriented to person, place, and time.  Speech is clear and fluent.   Cranial Nerves: Pupils equal round and reactive to light.  Facial tone is symmetric.  Facial sensation is symmetric. Shoulder shrug is symmetric. Tongue protrusion is midline.  There is no pronator drift.  Strength: Side Biceps Triceps Deltoid Interossei Grip Wrist Ext. Wrist Flex.  R 5 5 4  4- 4- 4 4  L 5 5 4+ 4- 4- 4+ 4+   Side Iliopsoas Quads Hamstring PF DF EHL  R 5 5 5 5 5 5   L 5 5 5 5 5 5    Reflexes are 1+ and symmetric at the biceps, triceps, brachioradialis, patella and achilles.   Hoffman's is absent.   Bilateral upper and lower extremity sensation is intact to light touch.    No evidence of dysmetria noted.  Gait is slow and wide-based.  She has moderate difficulty with tandem gait.     Medical Decision Making  Imaging: MRI C spine 05/10/2023 MPRESSION: 1. C4-C5 severe spinal canal stenosis and mild right neural foraminal narrowing. There is flattening of the spinal cord at this level, but no abnormal cord signal. 2. C5-C6 moderate spinal canal stenosis and moderate right neural foraminal narrowing. 3. C3-C4 mild-to-moderate spinal canal stenosis and mild right neural foraminal narrowing. 4. C7-T1 moderate right and mild left neural foraminal narrowing.     Electronically Signed   By: Wiliam Ke M.D.   On: 05/16/2023 20:51  I have personally reviewed the images and agree with the above interpretation.  Assessment and Plan: Michele Craig is a pleasant 52 y.o. 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  She has worsening symptoms and has objective evidence of weakness on  examination.  She has moderate difficulty with tandem walk suggesting significant imbalance.  There is no role for conservative management and cervical spondylotic myelopathy with subjective weakness.  I recommend surgical intervention with C4-6 anterior cervical discectomy and fusion.  I discussed the planned procedure at length with the patient, including the risks, benefits, alternatives, and indications. The risks discussed include but are not limited to bleeding, infection, need for reoperation, spinal fluid leak, stroke, vision loss, anesthetic complication, coma, paralysis, and even death. We also discussed the possibility of post-operative dysphagia, vocal cord paralysis, and the risk of adjacent segment disease in the future. I also described in detail that improvement was not guaranteed.  The patient expressed understanding of these risks, and asked that we proceed with surgery. I described the surgery in layman's terms, and gave ample opportunity for questions, which were answered to the best of my ability.     Thank you for involving me in the care of this patient.      Genese Quebedeaux K. Myer Haff MD, [facility] Neurosurgery

## 2023-06-03 ENCOUNTER — Ambulatory Visit: Payer: 59 | Admitting: Family Medicine

## 2023-06-04 ENCOUNTER — Encounter: Payer: Self-pay | Admitting: Neurosurgery

## 2023-06-04 ENCOUNTER — Ambulatory Visit (INDEPENDENT_AMBULATORY_CARE_PROVIDER_SITE_OTHER): Payer: 59 | Admitting: Neurosurgery

## 2023-06-04 VITALS — BP 126/82 | Ht 67.0 in | Wt 332.6 lb

## 2023-06-04 DIAGNOSIS — G959 Disease of spinal cord, unspecified: Secondary | ICD-10-CM | POA: Diagnosis not present

## 2023-06-04 DIAGNOSIS — M4802 Spinal stenosis, cervical region: Secondary | ICD-10-CM

## 2023-06-04 NOTE — Addendum Note (Signed)
Addended by: Sharlot Gowda on: 06/04/2023 11:08 AM   Modules accepted: Orders

## 2023-06-04 NOTE — Addendum Note (Signed)
Addended by: Sharlot Gowda on: 06/04/2023 12:49 PM   Modules accepted: Orders

## 2023-06-04 NOTE — Patient Instructions (Signed)
Please see below for information in regards to your upcoming surgery:   Planned surgery: C4-6 anterior cervical discectomy and fusion   Surgery date: 07/08/23 at [facility] - you will find out your arrival time the business day before your surgery.   Pre-op appointment at [facility] Pre-admit Testing: we will call you with a date/time for this. Pre-admit testing is located on the first floor of the [facility] building, [address] [facility] [address]. Please bring all prescriptions in the original prescription bottles to your appointment, even if you have reviewed medications by phone with a pharmacy representative. During this appointment, they will advise you which medications you can take the morning of surgery, and which medications you will need to hold for surgery. Pre-op labs may be done at your pre-op appointment. You are not required to fast for these labs. Should you need to change your pre-op appointment, please call Pre-admit testing at [phone].    Surgical clearance: we will send a clearance form to Kurtis Bushman, FNP    NSAIDS (Non-steroidal anti-inflammatory drugs): because you are having a fusion, please avoid taking any NSAIDS (examples: ibuprofen, motrin, aleve, naproxen, meloxicam, diclofenac) for 3 months after surgery. Celebrex is an exception and is OK to take, if prescribed. Tylenol is not an NSAID.    Because you are having a fusion or arthroplasty: for appointments after your 2 week follow-up: please arrive at the [facility] outpatient imaging center ([address], [address], [place]) or [facility] one hour prior to your appointment for x-rays. This applies to every appointment after your 2 week follow-up. Failure to do so may result in your appointment being rescheduled.    Common restrictions after surgery: No bending, lifting, or twisting ("BLT"). Avoid lifting objects heavier than 10 pounds for  the first 6 weeks after surgery. Where possible, avoid household activities that involve lifting, bending, reaching, pushing, or pulling such as laundry, vacuuming, grocery shopping, and childcare. Try to arrange for help from friends and family for these activities while you heal. Do not drive while taking prescription pain medication. Weeks 6 through 12 after surgery: avoid lifting more than 25 pounds.     How to contact us:  If you have any questions/concerns before or after surgery, you can reach [place] at [phone], or you can send a mychart message. We can be reached by phone or mychart 8am-4pm, Monday-Friday.  *Please note: Calls after 4pm are forwarded to a third party answering service. Mychart messages are not routinely monitored during evenings, weekends, and holidays. Please call our office to contact the answering service for urgent concerns during non-business hours.    If you have FMLA/disability paperwork, please drop it off or fax it to [phone], attention Patty.   Appointments/FMLA & disability paperwork: Patty & Cristin  Nurse: Royston Cowper  Medical assistants: Laurann Montana, & Lyla Son Physician Assistants: Manning Charity & Drake Leach Surgeons: Venetia Night, MD & Ernestine Mcmurray, MD

## 2023-06-05 ENCOUNTER — Other Ambulatory Visit: Payer: Self-pay

## 2023-06-05 DIAGNOSIS — Z01818 Encounter for other preprocedural examination: Secondary | ICD-10-CM

## 2023-06-10 ENCOUNTER — Telehealth: Payer: Self-pay

## 2023-06-10 NOTE — Telephone Encounter (Signed)
I called Mrs Michele Craig to offer her to move her surgery date up to 06/26/23 due to an opening in Dr Lucienne Capers schedule. She would like to discuss with her family and call me back.

## 2023-06-10 NOTE — Telephone Encounter (Signed)
She left a message on the v/m at lunch time she wants to keep her original surgery date. 8/16 is too soon.

## 2023-06-11 ENCOUNTER — Ambulatory Visit (INDEPENDENT_AMBULATORY_CARE_PROVIDER_SITE_OTHER): Payer: 59 | Admitting: Family Medicine

## 2023-06-11 ENCOUNTER — Encounter: Payer: Self-pay | Admitting: Family Medicine

## 2023-06-11 VITALS — BP 115/80 | HR 89 | Temp 97.8°F | Ht 67.0 in | Wt 330.0 lb

## 2023-06-11 DIAGNOSIS — G43E09 Chronic migraine with aura, not intractable, without status migrainosus: Secondary | ICD-10-CM | POA: Diagnosis not present

## 2023-06-11 DIAGNOSIS — Z23 Encounter for immunization: Secondary | ICD-10-CM

## 2023-06-11 DIAGNOSIS — Z419 Encounter for procedure for purposes other than remedying health state, unspecified: Secondary | ICD-10-CM | POA: Diagnosis not present

## 2023-06-11 DIAGNOSIS — I10 Essential (primary) hypertension: Secondary | ICD-10-CM | POA: Diagnosis not present

## 2023-06-11 DIAGNOSIS — E1159 Type 2 diabetes mellitus with other circulatory complications: Secondary | ICD-10-CM

## 2023-06-11 NOTE — Progress Notes (Addendum)
Subjective:  HPI: Michele Craig is a 52 y.o. female presenting on 06/11/2023 for Follow-up (6 mos f/u)   HPI Patient is in today for chronic condition management. She is attending medical weight management for weight loss and is trying diet modifications, exercise is limited by her back pain. She is having C 4-6 cervical discectomy and fusion soon. Her diabetes is well controlled and unmedicated. Foot exam done today. Her BP is well controlled, she does not monitor this at home. Also unmedicated. She reports increase in frequency of her headaches, they occur 1-2 times daily. The pain is bilateral frontal, she has light sensitivity. Unchanged in character or intensity but she is having breakthrough headaches on Sumatriptan.    Review of Systems  Eyes: Negative.   Respiratory: Negative.    Cardiovascular: Negative.   Gastrointestinal: Negative.   Genitourinary: Negative.   Musculoskeletal: Negative.   Neurological:  Positive for headaches.  Psychiatric/Behavioral:  Positive for depression. The patient is nervous/anxious.     Relevant past medical history reviewed and updated as indicated.   Past Medical History:  Diagnosis Date   Anemia    Anxiety    Arthritis    Complication of anesthesia    with carpal tunnel syndrome the block didn't work well, she could feel things during the surgery   Depression    Family history of adverse reaction to anesthesia    unknown, pt is adopted   Fibromyalgia    Foot pain    GERD (gastroesophageal reflux disease)    Headache    Hypertension    IBS (irritable bowel syndrome)    after cholecystectomy and then it went away after Gastric sleeve   Moderate persistent asthma    OSA (obstructive sleep apnea)    does not use cpap   RA (rheumatoid arthritis) (HCC)    Shortness of breath dyspnea    only when her asthma is flaring up   Type 2 diabetes mellitus (HCC)    pt states after her gastric sleeve procedure she is no longer on medications  and is not checked for it     Past Surgical History:  Procedure Laterality Date   CARPAL TUNNEL RELEASE     CARPAL TUNNEL RELEASE Right 04/04/2016   Procedure: RIGHT CARPAL TUNNEL RELEASE;  Surgeon: Frederico Hamman, MD;  Location: [facility] OR;  Service: Orthopedics;  Laterality: Right;  Synovectomy    CESAREAN SECTION     x 2   CHOLECYSTECTOMY     deuodenal switch     DILATION AND CURETTAGE OF UTERUS     LAPAROSCOPIC GASTRIC SLEEVE RESECTION     LEFT HEART CATH AND CORONARY ANGIOGRAPHY N/A 06/05/2022   Procedure: LEFT HEART CATH AND CORONARY ANGIOGRAPHY;  Surgeon: Lyn Records, MD;  Location: [facility];  Service: Cardiovascular;  Laterality: N/A;   TONSILLECTOMY      Allergies and medications reviewed and updated.   Current Outpatient Medications:    ADVAIR DISKUS 250-50 MCG/DOSE AEPB, Inhale 1 puff into the lungs 2 (two) times daily. , Disp: , Rfl:    albuterol (VENTOLIN HFA) 108 (90 Base) MCG/ACT inhaler, Inhale 2 puffs into the lungs every 4 (four) hours as needed., Disp: , Rfl:    ARIPiprazole (ABILIFY) 10 MG tablet, Take 10 mg by mouth every morning., Disp: , Rfl:    buPROPion (WELLBUTRIN XL) 150 MG 24 hr tablet, Take 450 mg by mouth daily., Disp: , Rfl:    celecoxib (CELEBREX) 50 MG capsule, Take  50 mg by mouth 2 (two) times daily., Disp: , Rfl:    CIMZIA PREFILLED 2 X 200 MG/ML PSKT, Inject 200 mg into the skin every 14 (fourteen) days., Disp: , Rfl:    clonazePAM (KLONOPIN) 2 MG tablet, Take 2 mg by mouth at bedtime., Disp: , Rfl:    DULoxetine (CYMBALTA) 60 MG capsule, Take 60 mg by mouth daily., Disp: , Rfl:    fluticasone (FLONASE) 50 MCG/ACT nasal spray, Place 1 spray into both nostrils daily as needed for allergies., Disp: , Rfl:    fluticasone (FLOVENT HFA) 44 MCG/ACT inhaler, Inhale 1 puff into the lungs daily as needed (for shortness of breath)., Disp: , Rfl:    gabapentin (NEURONTIN) 100 MG capsule, 400 mg 2 (two) times daily., Disp: , Rfl:    hydroxychloroquine  (PLAQUENIL) 200 MG tablet, Take 200 mg by mouth 2 (two) times daily., Disp: , Rfl:    ipratropium (ATROVENT) 0.03 % nasal spray, Place 2 sprays into both nostrils every 12 (twelve) hours., Disp: 30 mL, Rfl: 12   ketorolac (TORADOL) 10 MG tablet, Take 1 tablet (10 mg total) by mouth every 6 (six) hours as needed., Disp: 20 tablet, Rfl: 0   levalbuterol (XOPENEX HFA) 45 MCG/ACT inhaler, Inhale 2 puffs into the lungs 3 (three) times daily as needed for shortness of breath., Disp: , Rfl:    levalbuterol (XOPENEX) 0.63 MG/3ML nebulizer solution, Take 3 mLs (0.63 mg total) by nebulization every 6 (six) hours as needed for wheezing or shortness of breath., Disp: 30 mL, Rfl: 5   levocetirizine (XYZAL) 5 MG tablet, Take 5 mg by mouth every evening., Disp: , Rfl:    levonorgestrel (MIRENA, 52 MG,) 20 MCG/DAY IUD, Mirena 20 mcg/24 hours (7 yrs) 52 mg intrauterine device  Take 1 device by intrauterine route., Disp: , Rfl:    methocarbamol (ROBAXIN) 500 MG tablet, Take 1 tablet by mouth 3 (three) times daily as needed., Disp: , Rfl:    montelukast (SINGULAIR) 10 MG tablet, Take 10 mg by mouth at bedtime., Disp: , Rfl:    omeprazole (PRILOSEC) 20 MG capsule, Take 20 mg by mouth every evening., Disp: , Rfl:    SUMAtriptan (IMITREX) 100 MG tablet, Take 100 mg by mouth every 2 (two) hours as needed for migraine., Disp: , Rfl:    SYMBICORT 160-4.5 MCG/ACT inhaler, Inhale 2 puffs into the lungs daily., Disp: , Rfl:    traZODone (DESYREL) 100 MG tablet, Take 100 mg by mouth at bedtime., Disp: , Rfl:    Vitamin D, Ergocalciferol, (DRISDOL) 1.25 MG (50000 UNIT) CAPS capsule, Take 50,000 Units by mouth every Sunday., Disp: , Rfl:   Allergies  Allergen Reactions   Penicillins Hives, Itching, Rash and Other (See Comments)    Has patient had a PCN reaction causing immediate rash, facial/tongue/throat swelling, SOB or lightheadedness with hypotension: No  Has patient had a PCN reaction causing severe rash involving mucus  membranes or skin necrosis: No  Has patient had a PCN reaction that required hospitalization No  Has patient had a PCN reaction occurring within the last 10 years: No  If all of the above answers are "NO", then may proceed with Cephalosporin use.  Has patient had a PCN reaction causing immediate rash, facial/tongue/throat swelling, SOB or lightheadedness with hypotension: No  Has patient had a PCN reaction causing severe rash involving mucus membranes or skin necrosis: No  Has patient had a PCN reaction that required hospitalization No  Has patient had a PCN reaction  occurring within the last 10 years: No  If all of the above answers are "NO", then may proceed with Cephalosporin use.  Other reaction(s): rash  Has patient had a PCN reaction causing immediate rash, facial/tongue/throat swelling, SOB or lightheadedness with hypotension: No, Has patient had a PCN reaction causing severe rash involving mucus membranes or skin necrosis: No, Has patient had a PCN reaction that required hospitalization No, Has patient had a PCN reaction occurring within the last 10 years: No, If all of the above answers are "NO", then may proceed with Cephalosporin use., Other reaction(s): rash   Shellfish Allergy Anaphylaxis and Other (See Comments)    Crawdads   Cefadroxil Diarrhea, Other (See Comments), Rash and Hives    Abdominal pain   Zosyn [Piperacillin Sod-Tazobactam So] Hives and Itching   Clindamycin Hcl Other (See Comments)   Clindamycin Itching, Nausea Only, Other (See Comments) and Rash    Stomach pains    Objective:   BP 115/80   Pulse 89   Temp 97.8 F (36.6 C) (Oral)   Ht 5\' 7"  (1.702 m)   Wt (!) 330 lb (149.7 kg)   SpO2 97%   BMI 51.69 kg/m      06/11/2023    3:47 PM 06/04/2023    9:51 AM 04/22/2023    2:09 PM  Vitals with BMI  Height 5\' 7"  5\' 7"  5\' 7"   Weight 330 lbs 332 lbs 10 oz 331 lbs 10 oz  BMI 51.67 52.08 51.92  Systolic 115 126 952  Diastolic 80 82 69  Pulse 89  79      Physical Exam Vitals and nursing note reviewed.  Constitutional:      Appearance: Normal appearance. She is obese.  HENT:     Head: Normocephalic and atraumatic.  Cardiovascular:     Rate and Rhythm: Normal rate and regular rhythm.     Pulses: Normal pulses.     Heart sounds: Normal heart sounds.  Pulmonary:     Effort: Pulmonary effort is normal.     Breath sounds: Normal breath sounds.  Skin:    General: Skin is warm and dry.  Neurological:     General: No focal deficit present.     Mental Status: She is alert and oriented to person, place, and time. Mental status is at baseline.  Psychiatric:        Mood and Affect: Mood normal.        Behavior: Behavior normal.        Thought Content: Thought content normal.        Judgment: Judgment normal.     Assessment & Plan:  Type 2 diabetes mellitus with other circulatory complication, without long-term current use of insulin (HCC) Assessment & Plan: A1c and foot exam today.   Orders: -     Hemoglobin A1c -     Lipid panel  Hypertension, unspecified type Assessment & Plan: Well controlled. Not on medication. Counseled on importance of weight management for overall health, heart healthy diet, and exercise as tolerated.   Orders: -     CBC with Differential/Platelet -     COMPLETE METABOLIC PANEL WITH GFR -     Hemoglobin A1c -     Lipid panel  Morbid obesity (HCC) Assessment & Plan: Working with MWM on weight loss. Upcoming cervical discectomy and fusion. Hopefully this will improve her pain and she will be able to increase her physical activity.  Orders: -     CBC with Differential/Platelet -  COMPLETE METABOLIC PANEL WITH GFR -     Hemoglobin A1c -     Lipid panel  Chronic migraine with aura without status migrainosus, not intractable Assessment & Plan: Will consider switching to Nurtec, she is going to verify her medications with RN when she returns for fasting labs as her medication list does not  appear to be current. Would consider Neurology referral if unable to get control of these chronic headaches.      Follow up plan: Return in about 6 months (around 12/12/2023) for chronic follow-up with labs 1 week prior.  Park Meo, FNP

## 2023-06-11 NOTE — Assessment & Plan Note (Signed)
Will consider switching to Nurtec, she is going to verify her medications with RN when she returns for fasting labs as her medication list does not appear to be current. Would consider Neurology referral if unable to get control of these chronic headaches.

## 2023-06-11 NOTE — Assessment & Plan Note (Signed)
A1c and foot exam today.

## 2023-06-11 NOTE — Assessment & Plan Note (Signed)
Working with MWM on weight loss. Upcoming cervical discectomy and fusion. Hopefully this will improve her pain and she will be able to increase her physical activity.

## 2023-06-11 NOTE — Assessment & Plan Note (Signed)
Well controlled. Not on medication. Counseled on importance of weight management for overall health, heart healthy diet, and exercise as tolerated.

## 2023-06-12 ENCOUNTER — Other Ambulatory Visit (INDEPENDENT_AMBULATORY_CARE_PROVIDER_SITE_OTHER): Payer: 59

## 2023-06-12 DIAGNOSIS — Z23 Encounter for immunization: Secondary | ICD-10-CM

## 2023-06-25 ENCOUNTER — Telehealth: Payer: Self-pay | Admitting: Family Medicine

## 2023-06-25 NOTE — Telephone Encounter (Signed)
Patient called to follow up on initial visit with provider. Patient stated script for migraines hasn't been received by her pharmacy. Patient currently has a headache but no medication.  Pharmacy confirmed as:  [id number] [place], [place] - [address] AT [facility] [address] [facility] [address] [place], [place] [address] Phone: [phone]  Fax: [phone] DEA #: [id number]   Please advise at [phone].

## 2023-06-26 ENCOUNTER — Encounter: Admission: RE | Admit: 2023-06-26 | Payer: 59 | Source: Ambulatory Visit

## 2023-06-26 ENCOUNTER — Other Ambulatory Visit: Payer: Self-pay

## 2023-06-26 DIAGNOSIS — Z01812 Encounter for preprocedural laboratory examination: Secondary | ICD-10-CM | POA: Diagnosis not present

## 2023-06-26 DIAGNOSIS — I10 Essential (primary) hypertension: Secondary | ICD-10-CM | POA: Diagnosis not present

## 2023-06-26 DIAGNOSIS — E662 Morbid (severe) obesity with alveolar hypoventilation: Secondary | ICD-10-CM | POA: Insufficient documentation

## 2023-06-26 DIAGNOSIS — Z6841 Body Mass Index (BMI) 40.0 and over, adult: Secondary | ICD-10-CM | POA: Insufficient documentation

## 2023-06-26 DIAGNOSIS — Z01818 Encounter for other preprocedural examination: Secondary | ICD-10-CM | POA: Diagnosis present

## 2023-06-26 DIAGNOSIS — E1159 Type 2 diabetes mellitus with other circulatory complications: Secondary | ICD-10-CM | POA: Diagnosis not present

## 2023-06-26 DIAGNOSIS — E669 Obesity, unspecified: Secondary | ICD-10-CM

## 2023-06-26 DIAGNOSIS — Z01810 Encounter for preprocedural cardiovascular examination: Secondary | ICD-10-CM | POA: Insufficient documentation

## 2023-06-26 DIAGNOSIS — I447 Left bundle-branch block, unspecified: Secondary | ICD-10-CM | POA: Diagnosis not present

## 2023-06-26 HISTORY — DX: Left bundle-branch block, unspecified: I44.7

## 2023-06-26 LAB — CBC
HCT: 44 % (ref 36.0–46.0)
Hemoglobin: 14.3 g/dL (ref 12.0–15.0)
MCH: 29.7 pg (ref 26.0–34.0)
MCHC: 32.5 g/dL (ref 30.0–36.0)
MCV: 91.3 fL (ref 80.0–100.0)
Platelets: 313 10*3/uL (ref 150–400)
RBC: 4.82 MIL/uL (ref 3.87–5.11)
RDW: 12.4 % (ref 11.5–15.5)
WBC: 8.3 10*3/uL (ref 4.0–10.5)
nRBC: 0 % (ref 0.0–0.2)

## 2023-06-26 LAB — COMPREHENSIVE METABOLIC PANEL
ALT: 25 U/L (ref 0–44)
AST: 19 U/L (ref 15–41)
Albumin: 3.4 g/dL — ABNORMAL LOW (ref 3.5–5.0)
Alkaline Phosphatase: 102 U/L (ref 38–126)
Anion gap: 7 (ref 5–15)
BUN: 11 mg/dL (ref 6–20)
CO2: 22 mmol/L (ref 22–32)
Calcium: 8.3 mg/dL — ABNORMAL LOW (ref 8.9–10.3)
Chloride: 111 mmol/L (ref 98–111)
Creatinine, Ser: 0.87 mg/dL (ref 0.44–1.00)
GFR, Estimated: >60 mL/min (ref >60)
Glucose, Bld: 78 mg/dL (ref 70–99)
Potassium: 3.6 mmol/L (ref 3.5–5.1)
Sodium: 140 mmol/L (ref 135–145)
Total Bilirubin: 0.4 mg/dL (ref 0.3–1.2)
Total Protein: 6.4 g/dL — ABNORMAL LOW (ref 6.5–8.1)

## 2023-06-26 LAB — TYPE AND SCREEN
ABO/RH(D): B POS
Antibody Screen: NEGATIVE

## 2023-06-26 LAB — LIPID PANEL
Cholesterol: 107 mg/dL (ref 0–200)
HDL: 34 mg/dL — ABNORMAL LOW (ref >40)
LDL Cholesterol: 62 mg/dL (ref 0–99)
Total CHOL/HDL Ratio: 3.1 RATIO
Triglycerides: 54 mg/dL (ref <150)
VLDL: 11 mg/dL (ref 0–40)

## 2023-06-26 LAB — HEMOGLOBIN A1C
Hgb A1c MFr Bld: 5.4 % (ref 4.8–5.6)
Mean Plasma Glucose: 108.28 mg/dL

## 2023-06-26 LAB — SURGICAL PCR SCREEN
MRSA, PCR: NEGATIVE
Staphylococcus aureus: POSITIVE — AB

## 2023-06-26 NOTE — Patient Instructions (Addendum)
Your procedure is scheduled on: Wednesday August 28  Report to the Registration Desk on the 1st floor of the [facility] To find out your arrival time, please call [phone] between 1PM - 3PM on:  Tuesday August 27  If your arrival time is 6:00 am, do not arrive before that time as the [facility] entrance doors do not open until 6:00 am.  REMEMBER: Instructions that are not followed completely may result in serious medical risk, up to and including death; or upon the discretion of your surgeon and anesthesiologist your surgery may need to be rescheduled.  Do not eat food after midnight the night before surgery.  No gum chewing or hard candies.  You may however, drink WATER up to 2 hours before you are scheduled to arrive for your surgery. Do not drink anything within 2 hours of your scheduled arrival time..  One week prior to surgery: Stop Anti-inflammatories (NSAIDS) such as Advil, Aleve, Ibuprofen, Motrin, Naproxen, Naprosyn and Aspirin based products such as Excedrin, Goody's Powder, BC Powder.  Stop ANY OVER THE COUNTER supplements until after surgery. Vitamin D, Ergocalciferol, (DRISDOL)  You may however, continue to take Tylenol if needed for pain up until the day of surgery.  Continue taking all prescribed medications with the exception of the following: ketorolac (TORADOL)   **Follow guidelines for insulin and diabetes medications**  Follow recommendations from Cardiologist or PCP regarding stopping blood thinners.  TAKE ONLY THESE MEDICATIONS THE MORNING OF SURGERY WITH A SIP OF WATER:  ADVAIR DISKUS  albuterol (VENTOLIN HFA)  methocarbamol (ROBAXIN)  ARIPiprazole (ABILIFY)  buPROPion (WELLBUTRIN XL)  celecoxib (CELEBREX)  DULoxetine (CYMBALTA)  gabapentin (NEURONTIN)  hydroxychloroquine (PLAQUENIL)  methocarbamol (ROBAXIN)  omeprazole (PRILOSEC) Antacid (take one the night before and one on the morning of surgery - helps to prevent nausea after  surgery.)   Use inhalers on the day of surgery and bring to the hospital.  No Alcohol for 24 hours before or after surgery.  No Smoking including e-cigarettes for 24 hours before surgery.  No chewable tobacco products for at least 6 hours before surgery.  No nicotine patches on the day of surgery.  Do not use any "recreational" drugs for at least a week (preferably 2 weeks) before your surgery.  Please be advised that the combination of cocaine and anesthesia may have negative outcomes, up to and including death. If you test positive for cocaine, your surgery will be cancelled.  On the morning of surgery brush your teeth with toothpaste and water, you may rinse your mouth with mouthwash if you wish. Do not swallow any toothpaste or mouthwash.  Use CHG Soap as directed on instruction sheet.  Do not wear jewelry, make-up, hairpins, clips or nail polish.  Do not wear lotions, powders, or perfumes.   Do not shave body hair from the neck down 48 hours before surgery.  Do not bring valuables to the hospital. [facility] is not responsible for any missing/lost belongings or valuables.   Notify your doctor if there is any change in your medical condition (cold, fever, infection).  Wear comfortable clothing (specific to your surgery type) to the hospital.  After surgery, you can help prevent lung complications by doing breathing exercises.   If you are being admitted to the hospital overnight, leave your suitcase in the car. After surgery it may be brought to your room.  In case of increased patient census, it may be necessary for you, the patient, to continue your postoperative care in  the Same Day Surgery department.  If you are being discharged the day of surgery, you will not be allowed to drive home. You will need a responsible individual to drive you home and stay with you for 24 hours after surgery.   If you are taking public transportation, you will need to have a responsible  individual with you.  Please call the Pre-admissions Testing Dept. at [phone] if you have any questions about these instructions.  Surgery Visitation Policy:  Patients having surgery or a procedure may have two visitors.  Children under the age of 51 must have an adult with them who is not the patient.  Inpatient Visitation:    Visiting hours are 7 a.m. to 8 p.m. Up to four visitors are allowed at one time in a patient room. The visitors may rotate out with other people during the day.  One visitor age 39 or older may stay with the patient overnight and must be in the room by 8 p.m.      Preparing for Surgery with CHLORHEXIDINE GLUCONATE (CHG) Soap  Chlorhexidine Gluconate (CHG) Soap  o An antiseptic cleaner that kills germs and bonds with the skin to continue killing germs even after washing  o Used for showering the night before surgery and morning of surgery  Before surgery, you can play an important role by reducing the number of germs on your skin.  CHG (Chlorhexidine gluconate) soap is an antiseptic cleanser which kills germs and bonds with the skin to continue killing germs even after washing.  Please do not use if you have an allergy to CHG or antibacterial soaps. If your skin becomes reddened/irritated stop using the CHG.  1. Shower the NIGHT BEFORE SURGERY and the MORNING OF SURGERY with CHG soap.  2. If you choose to wash your hair, wash your hair first as usual with your normal shampoo.  3. After shampooing, rinse your hair and body thoroughly to remove the shampoo.  4. Use CHG as you would any other liquid soap. You can apply CHG directly to the skin and wash gently with a scrungie or a clean washcloth.  5. Apply the CHG soap to your body only from the neck down. Do not use on open wounds or open sores. Avoid contact with your eyes, ears, mouth, and genitals (private parts). Wash face and genitals (private parts) with your normal soap.  6. Wash  thoroughly, paying special attention to the area where your surgery will be performed.  7. Thoroughly rinse your body with warm water.  8. Do not shower/wash with your normal soap after using and rinsing off the CHG soap.  9. Pat yourself dry with a clean towel.  10. Wear clean pajamas to bed the night before surgery.  12. Place clean sheets on your bed the night of your first shower and do not sleep with pets.  13. Shower again with the CHG soap on the day of surgery prior to arriving at the hospital.  14. Do not apply any deodorants/lotions/powders.  15. Please wear clean clothes to the hospital.

## 2023-07-07 MED ORDER — ORAL CARE MOUTH RINSE: 15.0000 mL | Freq: Once | OROMUCOSAL | Status: AC

## 2023-07-07 MED ORDER — CEFAZOLIN IN SODIUM CHLORIDE 3-0.9 GM/100ML-% IV SOLN
3.0000 g | INTRAVENOUS | Status: AC
  Administered 2023-07-08: 3 g via INTRAVENOUS
  Filled 2023-07-07: qty 100

## 2023-07-07 MED ORDER — CEFAZOLIN IN SODIUM CHLORIDE 2-0.9 GM/100ML-% IV SOLN
3.0000 g | Freq: Once | INTRAVENOUS | Status: DC
  Filled 2023-07-07: qty 200

## 2023-07-07 MED ORDER — CHLORHEXIDINE GLUCONATE 0.12 % MT SOLN
15.0000 mL | Freq: Once | OROMUCOSAL | Status: AC
  Administered 2023-07-08: 15 mL via OROMUCOSAL

## 2023-07-08 ENCOUNTER — Other Ambulatory Visit: Payer: Self-pay

## 2023-07-08 ENCOUNTER — Ambulatory Visit: Payer: 59 | Admitting: Certified Registered"

## 2023-07-08 ENCOUNTER — Ambulatory Visit: Payer: 59 | Admitting: Urgent Care

## 2023-07-08 ENCOUNTER — Encounter: Payer: Self-pay | Admitting: Neurosurgery

## 2023-07-08 ENCOUNTER — Ambulatory Visit: Payer: 59

## 2023-07-08 ENCOUNTER — Observation Stay
Admission: RE | Admit: 2023-07-08 | Discharge: 2023-07-09 | Disposition: A | Discharge status: Home / Self Care | Payer: 59 | Attending: Neurosurgery | Admitting: Neurosurgery

## 2023-07-08 ENCOUNTER — Encounter
Admission: RE | Disposition: A | Discharge status: Home / Self Care | Payer: Self-pay | Source: Home / Self Care | Attending: Neurosurgery

## 2023-07-08 DIAGNOSIS — I10 Essential (primary) hypertension: Secondary | ICD-10-CM | POA: Diagnosis not present

## 2023-07-08 DIAGNOSIS — E119 Type 2 diabetes mellitus without complications: Secondary | ICD-10-CM | POA: Diagnosis not present

## 2023-07-08 DIAGNOSIS — J45909 Unspecified asthma, uncomplicated: Secondary | ICD-10-CM | POA: Diagnosis not present

## 2023-07-08 DIAGNOSIS — M50022 Cervical disc disorder at C5-C6 level with myelopathy: Secondary | ICD-10-CM | POA: Insufficient documentation

## 2023-07-08 DIAGNOSIS — G959 Disease of spinal cord, unspecified: Secondary | ICD-10-CM | POA: Diagnosis present

## 2023-07-08 DIAGNOSIS — M50021 Cervical disc disorder at C4-C5 level with myelopathy: Principal | ICD-10-CM | POA: Insufficient documentation

## 2023-07-08 DIAGNOSIS — M4802 Spinal stenosis, cervical region: Secondary | ICD-10-CM | POA: Diagnosis not present

## 2023-07-08 DIAGNOSIS — G8929 Other chronic pain: Secondary | ICD-10-CM

## 2023-07-08 DIAGNOSIS — Z01818 Encounter for other preprocedural examination: Secondary | ICD-10-CM

## 2023-07-08 HISTORY — PX: ANTERIOR CERVICAL DECOMP/DISCECTOMY FUSION: SHX1161

## 2023-07-08 LAB — GLUCOSE, CAPILLARY
Glucose-Capillary: 81 mg/dL (ref 70–99)
Glucose-Capillary: 104 mg/dL — ABNORMAL HIGH (ref 70–99)

## 2023-07-08 LAB — ABO/RH: ABO/RH(D): B POS

## 2023-07-08 SURGERY — ANTERIOR CERVICAL DECOMPRESSION/DISCECTOMY FUSION 2 LEVELS
Anesthesia: General | Site: Spine Cervical

## 2023-07-08 MED ORDER — PANTOPRAZOLE SODIUM 40 MG PO TBEC
40.0000 mg | DELAYED_RELEASE_TABLET | Freq: Every day | ORAL | Status: DC
  Administered 2023-07-08 – 2023-07-09 (×2): 40 mg via ORAL

## 2023-07-08 MED ORDER — SODIUM CHLORIDE 0.9 % IV SOLN: 250.0000 mL | INTRAVENOUS | Status: DC

## 2023-07-08 MED ORDER — DEXAMETHASONE SODIUM PHOSPHATE 10 MG/ML IJ SOLN
INTRAMUSCULAR | Status: DC | PRN
  Administered 2023-07-08: 10 mg via INTRAVENOUS

## 2023-07-08 MED ORDER — LACTATED RINGERS IV SOLN: INTRAVENOUS | Status: DC | PRN

## 2023-07-08 MED ORDER — PHENYLEPHRINE HCL-NACL 20-0.9 MG/250ML-% IV SOLN
INTRAVENOUS | Status: AC
  Filled 2023-07-08: qty 250

## 2023-07-08 MED ORDER — MONTELUKAST SODIUM 10 MG PO TABS
10.0000 mg | ORAL_TABLET | Freq: Every day | ORAL | Status: DC
  Administered 2023-07-08: 10 mg via ORAL
  Filled 2023-07-08: qty 1

## 2023-07-08 MED ORDER — FENTANYL CITRATE (PF) 100 MCG/2ML IJ SOLN
25.0000 ug | INTRAMUSCULAR | Status: AC | PRN
  Administered 2023-07-08 (×6): 25 ug via INTRAVENOUS

## 2023-07-08 MED ORDER — DULOXETINE HCL 60 MG PO CPEP
60.0000 mg | ORAL_CAPSULE | Freq: Every morning | ORAL | Status: DC
  Administered 2023-07-08 – 2023-07-09 (×2): 60 mg via ORAL
  Filled 2023-07-08 (×2): qty 1

## 2023-07-08 MED ORDER — GABAPENTIN 100 MG PO CAPS
ORAL_CAPSULE | ORAL | Status: AC
  Filled 2023-07-08: qty 1

## 2023-07-08 MED ORDER — PROPOFOL 1000 MG/100ML IV EMUL
INTRAVENOUS | Status: AC
  Filled 2023-07-08: qty 100

## 2023-07-08 MED ORDER — GABAPENTIN 300 MG PO CAPS
ORAL_CAPSULE | ORAL | Status: AC
  Filled 2023-07-08: qty 1

## 2023-07-08 MED ORDER — TRAZODONE HCL 100 MG PO TABS
100.0000 mg | ORAL_TABLET | Freq: Every day | ORAL | Status: DC
  Administered 2023-07-08: 100 mg via ORAL
  Filled 2023-07-08: qty 1

## 2023-07-08 MED ORDER — CLONAZEPAM 1 MG PO TABS
2.0000 mg | ORAL_TABLET | Freq: Every day | ORAL | Status: DC
  Administered 2023-07-08: 2 mg via ORAL
  Filled 2023-07-08 (×2): qty 4

## 2023-07-08 MED ORDER — ACETAMINOPHEN 500 MG PO TABS
1000.0000 mg | ORAL_TABLET | Freq: Four times a day (QID) | ORAL | Status: DC
  Administered 2023-07-08 – 2023-07-09 (×3): 1000 mg via ORAL

## 2023-07-08 MED ORDER — SENNA 8.6 MG PO TABS
ORAL_TABLET | ORAL | Status: AC
  Filled 2023-07-08: qty 1

## 2023-07-08 MED ORDER — PHENOL 1.4 % MT LIQD: 1.0000 | OROMUCOSAL | Status: DC | PRN

## 2023-07-08 MED ORDER — KETOROLAC TROMETHAMINE 15 MG/ML IJ SOLN
INTRAMUSCULAR | Status: AC
  Filled 2023-07-08: qty 1

## 2023-07-08 MED ORDER — VITAMIN D (ERGOCALCIFEROL) 1.25 MG (50000 UNIT) PO CAPS: 50000.0000 [IU] | ORAL_CAPSULE | ORAL | Status: DC

## 2023-07-08 MED ORDER — BISACODYL 10 MG RE SUPP: 10.0000 mg | Freq: Every day | RECTAL | Status: DC | PRN

## 2023-07-08 MED ORDER — SODIUM CHLORIDE 0.9% FLUSH
3.0000 mL | Freq: Two times a day (BID) | INTRAVENOUS | Status: DC
  Administered 2023-07-08 – 2023-07-09 (×2): 3 mL via INTRAVENOUS

## 2023-07-08 MED ORDER — KETAMINE HCL 10 MG/ML IJ SOLN
INTRAMUSCULAR | Status: DC | PRN
Start: 2023-07-08 — End: 2023-07-08
  Administered 2023-07-08: 50 mg via INTRAVENOUS

## 2023-07-08 MED ORDER — ACETAMINOPHEN 500 MG PO TABS
ORAL_TABLET | ORAL | Status: AC
  Filled 2023-07-08: qty 2

## 2023-07-08 MED ORDER — ONDANSETRON HCL 4 MG/2ML IJ SOLN: 4.0000 mg | Freq: Four times a day (QID) | INTRAMUSCULAR | Status: DC | PRN

## 2023-07-08 MED ORDER — SUMATRIPTAN SUCCINATE 50 MG PO TABS: 100.0000 mg | ORAL_TABLET | ORAL | Status: DC | PRN

## 2023-07-08 MED ORDER — OXYCODONE HCL 5 MG PO TABS
ORAL_TABLET | ORAL | Status: AC
  Filled 2023-07-08: qty 2

## 2023-07-08 MED ORDER — CELECOXIB 200 MG PO CAPS
200.0000 mg | ORAL_CAPSULE | Freq: Two times a day (BID) | ORAL | Status: DC
  Administered 2023-07-09: 200 mg via ORAL

## 2023-07-08 MED ORDER — CETIRIZINE HCL 10 MG PO TABS
5.0000 mg | ORAL_TABLET | Freq: Every evening | ORAL | Status: DC
  Administered 2023-07-08: 5 mg via ORAL

## 2023-07-08 MED ORDER — CETIRIZINE HCL 10 MG PO TABS
ORAL_TABLET | ORAL | Status: AC
  Filled 2023-07-08: qty 1

## 2023-07-08 MED ORDER — ARIPIPRAZOLE 10 MG PO TABS
10.0000 mg | ORAL_TABLET | Freq: Every morning | ORAL | Status: DC
  Administered 2023-07-08 – 2023-07-09 (×2): 10 mg via ORAL
  Filled 2023-07-08 (×2): qty 1

## 2023-07-08 MED ORDER — PHENYLEPHRINE HCL-NACL 20-0.9 MG/250ML-% IV SOLN
INTRAVENOUS | Status: DC | PRN
  Administered 2023-07-08: 50 ug/min via INTRAVENOUS

## 2023-07-08 MED ORDER — OXYCODONE HCL 5 MG PO TABS
ORAL_TABLET | ORAL | Status: AC
  Filled 2023-07-08: qty 1

## 2023-07-08 MED ORDER — LIDOCAINE HCL (PF) 2 % IJ SOLN
INTRAMUSCULAR | Status: AC
  Filled 2023-07-08: qty 5

## 2023-07-08 MED ORDER — SUCCINYLCHOLINE CHLORIDE 200 MG/10ML IV SOSY
PREFILLED_SYRINGE | INTRAVENOUS | Status: AC
  Filled 2023-07-08: qty 10

## 2023-07-08 MED ORDER — SODIUM CHLORIDE 0.9% FLUSH: 3.0000 mL | INTRAVENOUS | Status: DC | PRN

## 2023-07-08 MED ORDER — SURGIFLO WITH THROMBIN (HEMOSTATIC MATRIX KIT) OPTIME
TOPICAL | Status: DC | PRN
  Administered 2023-07-08: 1 via TOPICAL

## 2023-07-08 MED ORDER — BUPROPION HCL ER (XL) 300 MG PO TB24
450.0000 mg | ORAL_TABLET | Freq: Every morning | ORAL | Status: DC
  Administered 2023-07-08 – 2023-07-09 (×2): 450 mg via ORAL
  Filled 2023-07-08 (×2): qty 1

## 2023-07-08 MED ORDER — METHOCARBAMOL 500 MG PO TABS: 500.0000 mg | ORAL_TABLET | Freq: Four times a day (QID) | ORAL | Status: DC | PRN

## 2023-07-08 MED ORDER — OXYCODONE HCL 5 MG PO TABS
5.0000 mg | ORAL_TABLET | ORAL | Status: DC | PRN
  Administered 2023-07-08: 5 mg via ORAL

## 2023-07-08 MED ORDER — KETOROLAC TROMETHAMINE 30 MG/ML IJ SOLN
INTRAMUSCULAR | Status: DC | PRN
  Administered 2023-07-08: 30 mg via INTRAVENOUS

## 2023-07-08 MED ORDER — MIDAZOLAM HCL 2 MG/2ML IJ SOLN
INTRAMUSCULAR | Status: DC | PRN
  Administered 2023-07-08: 2 mg via INTRAVENOUS

## 2023-07-08 MED ORDER — DOCUSATE SODIUM 100 MG PO CAPS
ORAL_CAPSULE | ORAL | Status: AC
  Filled 2023-07-08: qty 1

## 2023-07-08 MED ORDER — ALBUTEROL SULFATE (2.5 MG/3ML) 0.083% IN NEBU: 3.0000 mL | INHALATION_SOLUTION | RESPIRATORY_TRACT | Status: DC | PRN

## 2023-07-08 MED ORDER — ONDANSETRON HCL 4 MG PO TABS: 4.0000 mg | ORAL_TABLET | Freq: Four times a day (QID) | ORAL | Status: DC | PRN

## 2023-07-08 MED ORDER — MOMETASONE FURO-FORMOTEROL FUM 200-5 MCG/ACT IN AERO
2.0000 | INHALATION_SPRAY | Freq: Two times a day (BID) | RESPIRATORY_TRACT | Status: DC
  Administered 2023-07-08 – 2023-07-09 (×2): 2 via RESPIRATORY_TRACT
  Filled 2023-07-08: qty 8.8

## 2023-07-08 MED ORDER — HYDROXYCHLOROQUINE SULFATE 200 MG PO TABS
200.0000 mg | ORAL_TABLET | Freq: Two times a day (BID) | ORAL | Status: DC
  Administered 2023-07-08 – 2023-07-09 (×3): 200 mg via ORAL
  Filled 2023-07-08 (×3): qty 1

## 2023-07-08 MED ORDER — ONDANSETRON HCL 4 MG/2ML IJ SOLN
INTRAMUSCULAR | Status: AC
  Filled 2023-07-08: qty 2

## 2023-07-08 MED ORDER — ENOXAPARIN SODIUM 40 MG/0.4ML IJ SOSY
40.0000 mg | PREFILLED_SYRINGE | INTRAMUSCULAR | Status: DC
  Administered 2023-07-09: 40 mg via SUBCUTANEOUS

## 2023-07-08 MED ORDER — PROPOFOL 10 MG/ML IV BOLUS
INTRAVENOUS | Status: AC
  Filled 2023-07-08: qty 20

## 2023-07-08 MED ORDER — METHOCARBAMOL 1000 MG/10ML IJ SOLN
500.0000 mg | Freq: Four times a day (QID) | INTRAVENOUS | Status: DC | PRN
  Administered 2023-07-08: 500 mg via INTRAVENOUS
  Filled 2023-07-08: qty 5

## 2023-07-08 MED ORDER — PANTOPRAZOLE SODIUM 40 MG PO TBEC
DELAYED_RELEASE_TABLET | ORAL | Status: AC
  Filled 2023-07-08: qty 1

## 2023-07-08 MED ORDER — FENTANYL CITRATE (PF) 100 MCG/2ML IJ SOLN
INTRAMUSCULAR | Status: AC
  Filled 2023-07-08: qty 2

## 2023-07-08 MED ORDER — FLUTICASONE PROPIONATE 50 MCG/ACT NA SUSP: 1.0000 | Freq: Every day | NASAL | Status: DC | PRN

## 2023-07-08 MED ORDER — OXYCODONE HCL 5 MG PO TABS
10.0000 mg | ORAL_TABLET | ORAL | Status: DC | PRN
  Administered 2023-07-08 – 2023-07-09 (×4): 10 mg via ORAL

## 2023-07-08 MED ORDER — MIDAZOLAM HCL 2 MG/2ML IJ SOLN
INTRAMUSCULAR | Status: AC
  Filled 2023-07-08: qty 2

## 2023-07-08 MED ORDER — POLYETHYLENE GLYCOL 3350 17 G PO PACK: 17.0000 g | PACK | Freq: Every day | ORAL | Status: DC | PRN

## 2023-07-08 MED ORDER — DEXAMETHASONE SODIUM PHOSPHATE 10 MG/ML IJ SOLN
INTRAMUSCULAR | Status: AC
  Filled 2023-07-08: qty 1

## 2023-07-08 MED ORDER — KETOROLAC TROMETHAMINE 15 MG/ML IJ SOLN
15.0000 mg | Freq: Four times a day (QID) | INTRAMUSCULAR | Status: DC
  Administered 2023-07-08 – 2023-07-09 (×3): 15 mg via INTRAVENOUS

## 2023-07-08 MED ORDER — SENNA 8.6 MG PO TABS
1.0000 | ORAL_TABLET | Freq: Two times a day (BID) | ORAL | Status: DC
  Administered 2023-07-08 – 2023-07-09 (×2): 8.6 mg via ORAL

## 2023-07-08 MED ORDER — CHLORHEXIDINE GLUCONATE 0.12 % MT SOLN
OROMUCOSAL | Status: AC
  Filled 2023-07-08: qty 15

## 2023-07-08 MED ORDER — ONDANSETRON HCL 4 MG/2ML IJ SOLN
INTRAMUSCULAR | Status: DC | PRN
  Administered 2023-07-08: 4 mg via INTRAVENOUS

## 2023-07-08 MED ORDER — ACETAMINOPHEN 325 MG PO TABS: 650.0000 mg | ORAL_TABLET | ORAL | Status: DC | PRN

## 2023-07-08 MED ORDER — MAGNESIUM CITRATE PO SOLN: 1.0000 | Freq: Once | ORAL | Status: DC | PRN

## 2023-07-08 MED ORDER — SUCCINYLCHOLINE CHLORIDE 200 MG/10ML IV SOSY
PREFILLED_SYRINGE | INTRAVENOUS | Status: DC | PRN
  Administered 2023-07-08: 160 mg via INTRAVENOUS

## 2023-07-08 MED ORDER — DOCUSATE SODIUM 100 MG PO CAPS
100.0000 mg | ORAL_CAPSULE | Freq: Two times a day (BID) | ORAL | Status: DC
  Administered 2023-07-08 – 2023-07-09 (×2): 100 mg via ORAL

## 2023-07-08 MED ORDER — BUPIVACAINE-EPINEPHRINE (PF) 0.5% -1:200000 IJ SOLN
INTRAMUSCULAR | Status: DC | PRN
  Administered 2023-07-08: 8.5 mL

## 2023-07-08 MED ORDER — LIDOCAINE HCL (CARDIAC) PF 100 MG/5ML IV SOSY
PREFILLED_SYRINGE | INTRAVENOUS | Status: DC | PRN
  Administered 2023-07-08: 100 mg via INTRAVENOUS

## 2023-07-08 MED ORDER — ACETAMINOPHEN 650 MG RE SUPP: 650.0000 mg | RECTAL | Status: DC | PRN

## 2023-07-08 MED ORDER — GABAPENTIN 300 MG PO CAPS
400.0000 mg | ORAL_CAPSULE | Freq: Three times a day (TID) | ORAL | Status: DC
  Administered 2023-07-08 – 2023-07-09 (×3): 400 mg via ORAL

## 2023-07-08 MED ORDER — DROPERIDOL 2.5 MG/ML IJ SOLN: 0.6250 mg | Freq: Once | INTRAMUSCULAR | Status: DC | PRN

## 2023-07-08 MED ORDER — SODIUM CHLORIDE 0.9 % IV SOLN: INTRAVENOUS | Status: DC

## 2023-07-08 MED ORDER — FENTANYL CITRATE (PF) 100 MCG/2ML IJ SOLN
INTRAMUSCULAR | Status: DC | PRN
  Administered 2023-07-08: 100 ug via INTRAVENOUS

## 2023-07-08 MED ORDER — BUPIVACAINE-EPINEPHRINE (PF) 0.5% -1:200000 IJ SOLN
INTRAMUSCULAR | Status: AC
  Filled 2023-07-08: qty 20

## 2023-07-08 MED ORDER — REMIFENTANIL HCL 1 MG IV SOLR
INTRAVENOUS | Status: AC
  Filled 2023-07-08: qty 1000

## 2023-07-08 MED ORDER — REMIFENTANIL HCL 1 MG IV SOLR
INTRAVENOUS | Status: DC | PRN
  Administered 2023-07-08: .1 ug/kg/min via INTRAVENOUS

## 2023-07-08 MED ORDER — KETAMINE HCL 50 MG/5ML IJ SOSY
PREFILLED_SYRINGE | INTRAMUSCULAR | Status: AC
  Filled 2023-07-08: qty 5

## 2023-07-08 MED ORDER — 0.9 % SODIUM CHLORIDE (POUR BTL) OPTIME
TOPICAL | Status: DC | PRN
  Administered 2023-07-08: 500 mL

## 2023-07-08 MED ORDER — PROPOFOL 10 MG/ML IV BOLUS
INTRAVENOUS | Status: DC | PRN
  Administered 2023-07-08: 120 ug/kg/min via INTRAVENOUS
  Administered 2023-07-08: 200 mg via INTRAVENOUS

## 2023-07-08 MED ORDER — MENTHOL 3 MG MT LOZG: 1.0000 | LOZENGE | OROMUCOSAL | Status: DC | PRN

## 2023-07-08 SURGICAL SUPPLY — 52 items
ADH SKN CLS APL DERMABOND .7 (GAUZE/BANDAGES/DRESSINGS) ×1
AGENT HMST KT MTR STRL THRMB (HEMOSTASIS) ×1
BASIN KIT SINGLE STR (MISCELLANEOUS) ×1 IMPLANT
BULB RESERV EVAC DRAIN JP 100C (MISCELLANEOUS) IMPLANT
BUR NEURO DRILL SOFT 3.0X3.8M (BURR) ×1 IMPLANT
DERMABOND ADVANCED .7 DNX12 (GAUZE/BANDAGES/DRESSINGS) ×1 IMPLANT
DRAIN CHANNEL JP 10F RND 20C F (MISCELLANEOUS) IMPLANT
DRAPE C ARM PK CFD 31 SPINE (DRAPES) ×1 IMPLANT
DRAPE LAPAROTOMY 77X122 PED (DRAPES) ×1 IMPLANT
DRAPE MICROSCOPE SPINE 48X150 (DRAPES) ×1 IMPLANT
DRSG TEGADERM 4X4.75 (GAUZE/BANDAGES/DRESSINGS) IMPLANT
ELECT REM PT RETURN 9FT ADLT (ELECTROSURGICAL) ×1
ELECTRODE REM PT RTRN 9FT ADLT (ELECTROSURGICAL) ×1 IMPLANT
FEE INTRAOP CADWELL SUPPLY NCS (MISCELLANEOUS) IMPLANT
FEE INTRAOP MONITOR IMPULS NCS (MISCELLANEOUS) IMPLANT
GLOVE BIOGEL PI IND STRL 6.5 (GLOVE) ×1 IMPLANT
GLOVE SURG SYN 6.5 ES PF (GLOVE) ×1 IMPLANT
GLOVE SURG SYN 6.5 PF PI (GLOVE) ×1 IMPLANT
GLOVE SURG SYN 8.5 E (GLOVE) ×3 IMPLANT
GLOVE SURG SYN 8.5 PF PI (GLOVE) ×3 IMPLANT
GOWN SRG LRG LVL 4 IMPRV REINF (GOWNS) ×1 IMPLANT
GOWN SRG XL LVL 3 NONREINFORCE (GOWNS) ×1 IMPLANT
GOWN STRL NON-REIN TWL XL LVL3 (GOWNS) ×1
GOWN STRL REIN LRG LVL4 (GOWNS) ×1
INTRAOP CADWELL SUPPLY FEE NCS (MISCELLANEOUS) ×1
INTRAOP DISP SUPPLY FEE NCS (MISCELLANEOUS) ×1
INTRAOP MONITOR FEE IMPULS NCS (MISCELLANEOUS) ×1
KIT TURNOVER KIT A (KITS) ×1 IMPLANT
MANIFOLD NEPTUNE II (INSTRUMENTS) ×1 IMPLANT
NDL SAFETY ECLIP 18X1.5 (MISCELLANEOUS) ×1 IMPLANT
NS IRRIG 1000ML POUR BTL (IV SOLUTION) ×1 IMPLANT
NS IRRIG 500ML POUR BTL (IV SOLUTION) IMPLANT
PACK LAMINECTOMY ARMC (PACKS) ×1 IMPLANT
PAD ARMBOARD 7.5X6 YLW CONV (MISCELLANEOUS) ×2 IMPLANT
PIN CASPAR 14 (PIN) ×1 IMPLANT
PIN CASPAR 14MM (PIN) ×2
PLATE ACP 1.6X36 2LVL (Plate) IMPLANT
SCREW ACP 3.5X17 S/D VARIA (Screw) IMPLANT
SPACER CERVICAL FRGE 12X14X7-7 (Spacer) IMPLANT
SPONGE KITTNER 5P (MISCELLANEOUS) ×1 IMPLANT
STAPLER SKIN PROX 35W (STAPLE) IMPLANT
SURGIFLO W/THROMBIN 8M KIT (HEMOSTASIS) ×1 IMPLANT
SUT DVC VLOC 3-0 CL 6 P-12 (SUTURE) IMPLANT
SUT ETHILON 3-0 FS-10 30 BLK (SUTURE) ×1
SUT VIC AB 3-0 SH 8-18 (SUTURE) ×1 IMPLANT
SUT VICRYL 3-0 CR8 SH (SUTURE) ×1 IMPLANT
SUTURE EHLN 3-0 FS-10 30 BLK (SUTURE) IMPLANT
SYR 20ML LL LF (SYRINGE) ×1 IMPLANT
TAPE CLOTH 3X10 WHT NS LF (GAUZE/BANDAGES/DRESSINGS) ×3 IMPLANT
TRAP FLUID SMOKE EVACUATOR (MISCELLANEOUS) ×1 IMPLANT
TRAY FOLEY SLVR 16FR LF STAT (SET/KITS/TRAYS/PACK) IMPLANT
WATER STERILE IRR 1000ML POUR (IV SOLUTION) ×2 IMPLANT

## 2023-07-08 NOTE — Anesthesia Procedure Notes (Signed)
Procedure Name: Intubation Date/Time: 07/08/2023 7:35 AM  Performed by: Berniece Pap, CRNAPre-anesthesia Checklist: Patient identified, Emergency Drugs available, Suction available and Patient being monitored Patient Re-evaluated:Patient Re-evaluated prior to induction Oxygen Delivery Method: Circle system utilized Preoxygenation: Pre-oxygenation with 100% oxygen Induction Type: IV induction Ventilation: Mask ventilation without difficulty Laryngoscope Size: McGraph and 4 Grade View: Grade I Tube type: Oral Tube size: 7.0 mm Number of attempts: 1 Airway Equipment and Method: Stylet and Oral airway Placement Confirmation: ETT inserted through vocal cords under direct vision, positive ETCO2 and breath sounds checked- equal and bilateral Secured at: 21 cm Tube secured with: Tape Dental Injury: Teeth and Oropharynx as per pre-operative assessment

## 2023-07-08 NOTE — Discharge Instructions (Signed)
Your surgeon has performed an operation on your cervical spine (neck) to relieve pressure on the spinal cord and/or nerves. This involved making an incision in the front of your neck and removing one or more of the discs that support your spine. Next, a small piece of bone, a titanium plate, and screws were used to fuse two or more of the vertebrae (bones) together.  The following are instructions to help in your recovery once you have been discharged from the hospital. Even if you feel well, it is important that you follow these activity guidelines. If you do not let your neck heal properly from the surgery, you can increase the chance of return of your symptoms and other complications.  * Do not take anti-inflammatory medications for 3 months after surgery (naproxen [Aleve], ibuprofen [Advil, Motrin], etc.). These medications can prevent your bones from healing properly.  Celebrex, if prescribed, is ok to take.  Activity    No bending, lifting, or twisting ("BLT"). Avoid lifting objects heavier than 10 pounds (gallon milk jug).  Where possible, avoid household activities that involve lifting, bending, reaching, pushing, or pulling such as laundry, vacuuming, grocery shopping, and childcare. Try to arrange for help from friends and family for these activities while your back heals.  Increase physical activity slowly as tolerated.  Taking short walks is encouraged, but avoid strenuous exercise. Do not jog, run, bicycle, lift weights, or participate in any other exercises unless specifically allowed by your doctor.  Talk to your doctor before resuming sexual activity.  You should not drive until cleared by your doctor.  Until released by your doctor, you should not return to work or school.  You should rest at home and let your body heal.   You may shower three days after your surgery.  After showering, lightly dab your incision dry. Do not take a tub bath or go swimming until approved by your  doctor at your follow-up appointment.  If your doctor ordered a cervical collar (neck brace) for you, you should wear it whenever you are out of bed. You may remove it when lying down or sleeping, but you should wear it at all other times. Not all neck surgeries require a cervical collar.  If you smoke, we strongly recommend that you quit.  Smoking has been proven to interfere with normal bone healing and will dramatically reduce the success rate of your surgery. Please contact [facility] (800-QUIT-NOW) and use the resources at www.[facility].com for assistance in stopping smoking.  Surgical Incision   If you have a dressing on your incision, you may remove it two days after your surgery. Keep your incision area clean and dry.  If you have staples or stitches on your incision, you should have a follow up scheduled for removal. If you do not have staples or stitches, you will have steri-strips (small pieces of surgical tape) or Dermabond glue. The steri-strips/glue should begin to peel away within about a week (it is fine if the steri-strips fall off before then). If the strips are still in place one week after your surgery, you may gently remove them.  Diet           You may return to your usual diet. However, you may experience discomfort when swallowing in the first month after your surgery. This is normal. You may find that softer foods are more comfortable for you to swallow. Be sure to stay hydrated.  When to Contact Us  You may experience pain in your   neck and/or pain between your shoulder blades. This is normal and should improve in the next few weeks with the help of pain medication, muscle relaxers, and rest. Some patients report that a warm compress on the back of the neck or between the shoulder blades helps.  However, should you experience any of the following, contact us immediately: New numbness or weakness Pain that is progressively getting worse, and is not relieved by your pain  medication, muscle relaxers, rest, and warm compresses Bleeding, redness, swelling, pain, or drainage from surgical incision Chills or flu-like symptoms Fever greater than 101.0 F (38.3 C) Inability to eat, drink fluids, or take medications Problems with bowel or bladder functions Difficulty breathing or shortness of breath Warmth, tenderness, or swelling in your calf Contact Information How to contact us:  If you have any questions/concerns before or after surgery, you can reach [place] at [phone], or you can send a mychart message. We can be reached by phone or mychart 8am-4pm, Monday-Friday.  *Please note: Calls after 4pm are forwarded to a third party answering service. Mychart messages are not routinely monitored during evenings, weekends, and holidays. Please call our office to contact the answering service for urgent concerns during non-business hours.

## 2023-07-08 NOTE — Evaluation (Signed)
Physical Therapy Evaluation Patient Details Name: Michele Craig MRN: [id number] DOB: 04/23/1971 Today's Date: 07/08/2023  History of Present Illness  Michele Craig is a 52yoF who comes to [facility] on 07/08/23 for elective ACDF after persistent pain, radiculopathy, myelopathy. At baseline pt is a household AMB, no falls history but self reported imbalance. Pt reports chronic AMB/standing intolerance due to lumbar spondylosis.  Clinical Impression  Pt seen in PACU, analgesia measures in place, has been up with NSG for toiletting, mild stable dizziness. Pt AMB ~68ft, trial use of BRW to assess utility for chronic imbalance, pt sees as favorable. Pt AMB close to baseline quality, however author suspects much more slow and cautious. Will return next day for stairs training. Pt has 5 stairs with 1 railing for home entry.       If plan is discharge home, recommend the following: A little help with walking and/or transfers;Assist for transportation;Help with stairs or ramp for entrance   Can travel by private vehicle        Equipment Recommendations  (Bariatric RW for width/habitus)  Recommendations for Other Services       Functional Status Assessment Patient has had a recent decline in their functional status and demonstrates the ability to make significant improvements in function in a reasonable and predictable amount of time.     Precautions / Restrictions Precautions Precautions: None Restrictions Weight Bearing Restrictions: No      Mobility  Bed Mobility Overal bed mobility: Modified Independent                  Transfers Overall transfer level: Modified independent Equipment used: None, Rolling walker (2 wheels)                    Ambulation/Gait Ambulation/Gait assistance: Supervision Gait Distance (Feet): 80 Feet Assistive device: Rolling walker (2 wheels) Gait Pattern/deviations: Step-to pattern Gait velocity: <0.59m/s        Stairs             Wheelchair Mobility     Tilt Bed    Modified Rankin (Stroke Patients Only)       Balance                                             Pertinent Vitals/Pain Pain Assessment Pain Assessment: 0-10 Pain Score: 9  Pain Location: neck, left hand first webspace Pain Descriptors / Indicators:  (hypersensitive) Pain Intervention(s): Limited activity within patient's tolerance, Monitored during session    Home Living Family/patient expects to be discharged to:: Private residence Living Arrangements: Spouse/significant other;Children (children in 63s; mother lives nearby will also help) Available Help at Discharge: Family Type of Home: House Home Access: Stairs to enter Entrance Stairs-Rails: Right Entrance Stairs-Number of Steps: 4-5   Home Layout: One level Home Equipment: None      Prior Function Prior Level of Function : Independent/Modified Independent                     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  General Comments      Exercises     Assessment/Plan    PT Assessment Patient needs continued PT services  PT Problem List Decreased strength;Decreased activity tolerance;Decreased balance       PT Treatment Interventions DME instruction;Gait training;Stair training;Functional mobility training;Therapeutic activities;Therapeutic exercise;Balance training    PT Goals (Current goals can be found in the Care Plan section)  Acute Rehab PT Goals Patient Stated Goal: improve balance andsafety PT Goal Formulation: With patient Time For Goal Achievement: 07/22/23 Potential to Achieve Goals: Good    Frequency BID     Co-evaluation               AM-PAC PT "6 Clicks" Mobility  Outcome Measure Help needed turning from your back to your side while in a flat bed without using bedrails?: A Little Help needed moving  from lying on your back to sitting on the side of a flat bed without using bedrails?: A Little Help needed moving to and from a bed to a chair (including a wheelchair)?: A Little Help needed standing up from a chair using your arms (e.g., wheelchair or bedside chair)?: A Little Help needed to walk in hospital room?: A Little Help needed climbing 3-5 steps with a railing? : A Little 6 Click Score: 18    End of Session Equipment Utilized During Treatment: Gait belt Activity Tolerance: Patient tolerated treatment well;No increased pain Patient left: in bed;with family/visitor present;with nursing/sitter in room;with call bell/phone within reach Nurse Communication: Mobility status PT Visit Diagnosis: Difficulty in walking, not elsewhere classified (R26.2);Unsteadiness on feet (R26.81);Other abnormalities of gait and mobility (R26.89)    Time: 4782-9562 PT Time Calculation (min) (ACUTE ONLY): 18 min   Charges:   PT Evaluation $PT Eval Low Complexity: 1 Low   PT General Charges $$ ACUTE PT VISIT: 1 Visit        5:04 PM, 07/08/23 Rosamaria Lints, PT, DPT Physical Therapist - [facility]  [phone] (ASCOM)    Burke Terry C 07/08/2023, 5:01 PM

## 2023-07-08 NOTE — Op Note (Addendum)
Indications: [name] is a 52 y.o. 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   Preoperative Note:    Risks of surgery discussed include: infection, bleeding, stroke, coma, death, paralysis, CSF leak, nerve/spinal cord injury, numbness, tingling, weakness, complex regional pain syndrome, recurrent stenosis and/or disc herniation, vascular injury, development of instability, neck/back pain, need for further surgery, persistent symptoms, development of deformity, and the risks of anesthesia. The patient understood these risks and agreed to proceed.   Procedure:  1) Anterior cervical diskectomy and fusion at C4/5 and C5/6 2) Anterior cervical instrumentation at C4 - 6 3) Placement of structural allograft  4) Use of operative microscope 5) Use of flouroscopy   Procedure: After obtaining informed consent, the patient taken to the operating room, placed in supine position, general anesthesia induced.  The patient had a small shoulder roll placed behind their shoulders.  The patient received preop antibiotics and IV Decadron.  The patient had a neck incision outlined, was prepped and draped in usual sterile fashion. The incision was injected with local anesthetic.   An incision was opened, dissection taken down medial to the carotid artery and jugular vein, lateral to the trachea and esophagus.  The prevertebral fascia identified and a localizing x-ray demonstrated the correct level.  The longus colli were dissected laterally, and self-retaining retractors placed to open the operative field. The microscope was then  brought into the field.  With this complete, distractor pins were placed in the vertebral bodies of C4 and C6. The distractor was placed, and the anuli at C4/5 and C5/6 were opened using a bovie.  Curettes and pituitary rongeurs used to remove the majority of disk, then the drill was used to remove the posterior osteophyte and begin the foraminotomies. The nerve hook was used to elevate the posterior longitudinal ligament, which was then removed with Kerrison rongeurs. The microblunt nerve hook could be passed out the foramen bilaterally at each level.   Meticulous hemostasis was obtained.  Structural allograft (Globus [place]) was tapped behind the anterior lip of the vertebral body at C4/5 (7 mm) and C5/6 (7 mm).    Please note that the procedure included removal of the disc, removal of the posterior osteophytes, and removal of the posterior longitudinal ligament to ensure decompression of the spinal cord.  Additionally, foraminotomies were performed on both sides of the spinal canal to decompress the nerve roots. This was performed at each level.  The caspar distractor was removed, and bone wax used for hemostasis. A separate, 36 mm 3 segment Nuvasive ACP plate was chosen.  Two screws placed in each vertebral body, respectively making sure the screws were behind the locking mechanism.  Final AP and lateral radiographs were taken.   A drain was placed.  Please note that the plate is not inclusive to the interbody structural allograft.  The anchoring mechanism of the plate is completely separate from the allograft.  With everything in good position, the wound was irrigated copiously and meticulous hemostasis obtained.  Wound was closed in 2 layers using interrupted inverted 3-0 Vicryl sutures.  The wound was dressed with dermabond, the head of bed at 30 degrees, taken to  recovery room in stable condition.  No new postop neurological deficits were identified.  Sponge and pattie counts were correct at the  end of the procedure. Monitoring was stable throughout.   I performed the entire procedure with the assistance of Manning Charity PA as an Designer, television/film set.An assistant was required for this procedure due to the complexity.  The assistant provided assistance in tissue manipulation and suction, and was required for the successful and safe performance of the procedure. I performed the critical portions of the procedure.   Venetia Night MD

## 2023-07-08 NOTE — Progress Notes (Addendum)
error 

## 2023-07-08 NOTE — H&P (Signed)
Referring Physician:  No referring provider defined for this encounter.  Primary Physician:  Park Meo, FNP  History of Present Illness: 07/08/2023 Michele Craig presents today with continued symptoms.    06/04/2023 Michele Craig is here today with a chief complaint of neck pain extending into her right hand.  She is also having weakness in her right hand.  She has trouble handling items with either of her arms.  She has to use both arms to pick up things that she previously did not have to.  She is having dexterity issues and changes in her handwriting.  She previously had carpal tunnel release which helped.  Her symptoms now are slightly different than her carpal tunnel syndrome.   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  Saw Drake Leach PA on 04/22/23  History of Present Illness: 04/22/2023 Michele Craig has a history of asthma, depression, GERD, HTN, obesity, DM, and FM.    History of gastric sleeve.    She sees pain management at [facility]. They discussed bilateral SI joint injections and bilateral L3-S1 MBB to RFA.    She has chronic constant low back pain into bilateral buttocks, no leg pain. Pain is worse with walking. Some improvement with laying flat. No numbness or tingling. She notes weakness in both legs.    She had heart cath in 2023, since then she has constant right sided neck pain that radiates down front of her right arm to entire hand. She has numbness/tingling in wrist that radiates up the arm. She has weakness in right arm. No left arm symptoms. Pain is worse with using her arm or moving her neck.    Bowel/Bladder  Dysfunction: none   She does not smoke.    Last HgBA1c was 5.4 on 12/02/22.     The symptoms are causing a significant impact on the patient's life.   I have utilized the care everywhere function in epic to review the outside records available from external health systems.  Review of Systems:  A 10 point review of systems is negative, except for the pertinent positives and negatives detailed in the HPI.  Past Medical History: Past Medical History:  Diagnosis Date   Anemia    Anxiety    Arthritis    Complication of anesthesia    with carpal tunnel syndrome the block didn't work well, she could feel things during the surgery   Depression    Family history of adverse reaction to anesthesia    unknown, pt is adopted   Fibromyalgia    Foot pain    GERD (gastroesophageal reflux disease)    Headache    Hypertension    IBS (irritable bowel syndrome)    after cholecystectomy and then it went away after Gastric sleeve   LBBB (left bundle branch block)    Moderate persistent asthma    OSA (obstructive sleep apnea)    does not use cpap   RA (rheumatoid arthritis) (HCC)    Shortness of breath dyspnea    only when her asthma is flaring up   Type 2 diabetes mellitus (HCC)    pt states after her gastric sleeve procedure she is no longer on medications and is not checked for it    Past Surgical History:  Past Surgical History:  Procedure Laterality Date   CARPAL TUNNEL RELEASE     CARPAL TUNNEL RELEASE Right 04/04/2016   Procedure: RIGHT CARPAL TUNNEL RELEASE;  Surgeon: Frederico Hamman, MD;  Location: [facility] OR;  Service: Orthopedics;  Laterality: Right;  Synovectomy    CESAREAN SECTION     x 2   CHOLECYSTECTOMY     deuodenal switch     DILATION AND CURETTAGE OF UTERUS     LAPAROSCOPIC GASTRIC SLEEVE RESECTION     LEFT HEART CATH AND CORONARY ANGIOGRAPHY N/A 06/05/2022   Procedure: LEFT HEART CATH AND CORONARY ANGIOGRAPHY;  Surgeon: Lyn Records, MD;  Location: [facility] INVASIVE CV LAB;   Service: Cardiovascular;  Laterality: N/A;   TONSILLECTOMY      Allergies: Allergies as of 06/05/2023 - Review Complete 06/04/2023  Allergen Reaction Noted   Penicillins Hives, Itching, Rash, and Other (See Comments) 04/01/2012   Shellfish allergy Anaphylaxis and Other (See Comments) 08/05/2011   Cefadroxil Diarrhea, Other (See Comments), Rash, and Hives 06/05/2022   Zosyn [piperacillin sod-tazobactam so] Hives and Itching 04/01/2012   Clindamycin hcl Other (See Comments) 04/22/2023   Clindamycin Itching, Nausea Only, Other (See Comments), and Rash 03/30/2022    Medications:  Current Facility-Administered Medications:    ceFAZolin (ANCEF) IVPB 3g/100 mL premix, 3 g, Intravenous, 60 min Pre-Op, Venetia Night, MD  Social History: Social History   Tobacco Use   Smoking status: Never   Smokeless tobacco: Never  Vaping Use   Vaping status: Never Used  Substance Use Topics   Alcohol use: Not Currently   Drug use: No    Family Medical History: Family History  Adopted: Yes  Family history unknown: Yes    Physical Examination: Vitals:   07/08/23 0612  BP: 102/67  Pulse: 80  Resp: 17  Temp: (!) 97.3 F (36.3 C)  SpO2: 98%   Heart sounds normal no MRG. Chest Clear to Auscultation Bilaterally.  General: Patient is in no apparent distress. Attention to examination is appropriate.  Neck:   Supple.  Full range of motion.  She does have numbness that occurs when she turns her head to the right.  Respiratory: Patient is breathing without any difficulty.   NEUROLOGICAL:     Awake, alert, oriented to person, place, and time.  Speech is clear and fluent.   Cranial Nerves: Pupils equal round and reactive to light.  Facial tone is symmetric.  Facial sensation is symmetric. Shoulder shrug is symmetric. Tongue protrusion is midline.  There is no pronator drift.  Strength: Side Biceps Triceps Deltoid Interossei Grip Wrist Ext. Wrist Flex.  R 5 5 4  4- 4- 4 4  L 5 5 4+ 4-  4- 4+ 4+   Side Iliopsoas Quads Hamstring PF DF EHL  R 5 5 5 5 5 5   L 5 5 5 5 5 5    Reflexes are 1+ and symmetric at the biceps, triceps, brachioradialis, patella and achilles.   Hoffman's is absent.   Bilateral upper and lower extremity sensation is intact to light touch.    No evidence of dysmetria noted.  Gait is slow and wide-based.  She has moderate difficulty with tandem gait.     Medical Decision Making  Imaging: MRI C spine 05/10/2023 MPRESSION: 1. C4-C5 severe spinal canal stenosis and mild right neural foraminal narrowing. There is flattening of the spinal cord at this level, but no abnormal cord signal. 2. C5-C6 moderate spinal canal stenosis and moderate right neural foraminal narrowing. 3. C3-C4 mild-to-moderate  spinal canal stenosis and mild right neural foraminal narrowing. 4. C7-T1 moderate right and mild left neural foraminal narrowing.     Electronically Signed   By: Wiliam Ke M.D.   On: 05/16/2023 20:51  I have personally reviewed the images and agree with the above interpretation.  Assessment and Plan: Ms. Claywell is a pleasant 52 y.o. 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  She has worsening symptoms and has objective evidence of weakness on examination.  She has moderate difficulty with tandem walk suggesting significant imbalance.  There is no role for conservative management and cervical spondylotic myelopathy with subjective weakness.  We will proceed with C4-6 anterior cervical discectomy and fusion.     Lakysha Kossman K. Myer Haff MD, [facility] Neurosurgery

## 2023-07-08 NOTE — Anesthesia Postprocedure Evaluation (Signed)
Anesthesia Post Note  Patient: Michele Craig  Procedure(s) Performed: C4-6 ANTERIOR CERVICAL DISCECTOMY AND FUSION (FORGE) (Spine Cervical)  Patient location during evaluation: PACU Anesthesia Type: General Level of consciousness: awake and alert Pain management: pain level controlled Vital Signs Assessment: post-procedure vital signs reviewed and stable Respiratory status: spontaneous breathing, nonlabored ventilation, respiratory function stable and patient connected to nasal cannula oxygen Cardiovascular status: blood pressure returned to baseline and stable Postop Assessment: no apparent nausea or vomiting Anesthetic complications: no   No notable events documented.   Last Vitals:  Vitals:   07/08/23 1130 07/08/23 1214  BP: 124/77 123/74  Pulse: 96 95  Resp: 15 16  Temp: (!) 36.1 C 36.4 C  SpO2:  94%    Last Pain:  Vitals:   07/08/23 1214  TempSrc: Oral  PainSc:                  [name]

## 2023-07-08 NOTE — Transfer of Care (Signed)
Immediate Anesthesia Transfer of Care Note  Patient: Michele Craig  Procedure(s) Performed: C4-6 ANTERIOR CERVICAL DISCECTOMY AND FUSION (FORGE) (Spine Cervical)  Patient Location: PACU  Anesthesia Type:General  Level of Consciousness: awake and alert   Airway & Oxygen Therapy: Patient Spontanous Breathing and Patient connected to face mask oxygen  Post-op Assessment: Report given to RN and Post -op Vital signs reviewed and stable  Post vital signs: Reviewed and stable  Last Vitals:  Vitals Value Taken Time  BP 127/71 07/08/23 0957  Temp    Pulse 101 07/08/23 0959  Resp 27 07/08/23 0959  SpO2 97 % 07/08/23 0959  Vitals shown include unfiled device data.  Last Pain:  Vitals:   07/08/23 0612  TempSrc: Temporal  PainSc: 8          Complications: No notable events documented.

## 2023-07-08 NOTE — Anesthesia Preprocedure Evaluation (Signed)
Anesthesia Evaluation  Patient identified by MRN, date of birth, ID band Patient awake    Reviewed: Allergy & Precautions, H&P , NPO status , Patient's Chart, lab work & pertinent test results, reviewed documented beta blocker date and time   History of Anesthesia Complications (+) Family history of anesthesia reaction and history of anesthetic complications  Airway Mallampati: III  TM Distance: >3 FB Neck ROM: full    Dental  (+) Dental Advidsory Given, Missing, Teeth Intact   Pulmonary neg shortness of breath, asthma , sleep apnea and Continuous Positive Airway Pressure Ventilation , neg COPD, neg recent URI   Pulmonary exam normal breath sounds clear to auscultation       Cardiovascular Exercise Tolerance: Good hypertension, (-) angina (-) Past MI and (-) Cardiac Stents Normal cardiovascular exam+ dysrhythmias (LBBB) (-) Valvular Problems/Murmurs Rhythm:regular Rate:Normal     Neuro/Psych  Headaches, neg Seizures PSYCHIATRIC DISORDERS Anxiety Depression     Neuromuscular disease (fibromyalgia)  negative psych ROS   GI/Hepatic Neg liver ROS,GERD  ,,  Endo/Other  diabetes  Morbid obesity  Renal/GU negative Renal ROS  negative genitourinary   Musculoskeletal   Abdominal   Peds  Hematology negative hematology ROS (+)   Anesthesia Other Findings Past Medical History: No date: Anemia No date: Anxiety No date: Arthritis No date: Complication of anesthesia     Comment:  with carpal tunnel syndrome the block didn't work well,               she could feel things during the surgery No date: Depression No date: Family history of adverse reaction to anesthesia     Comment:  unknown, pt is adopted No date: Fibromyalgia No date: Foot pain No date: GERD (gastroesophageal reflux disease) No date: Headache No date: Hypertension No date: IBS (irritable bowel syndrome)     Comment:  after cholecystectomy and then it went away  after               Gastric sleeve No date: LBBB (left bundle branch block) No date: Moderate persistent asthma No date: OSA (obstructive sleep apnea)     Comment:  does not use cpap No date: RA (rheumatoid arthritis) (HCC) No date: Shortness of breath dyspnea     Comment:  only when her asthma is flaring up No date: Type 2 diabetes mellitus (HCC)     Comment:  pt states after her gastric sleeve procedure she is no               longer on medications and is not checked for it   Reproductive/Obstetrics negative OB ROS                             Anesthesia Physical Anesthesia Plan  ASA: 3  Anesthesia Plan: General   Post-op Pain Management:    Induction: Intravenous  PONV Risk Score and Plan: 3 and Ondansetron, Dexamethasone, Midazolam and Treatment may vary due to age or medical condition  Airway Management Planned: Oral ETT  Additional Equipment:   Intra-op Plan:   Post-operative Plan: Extubation in OR  Informed Consent: I have reviewed the patients History and Physical, chart, labs and discussed the procedure including the risks, benefits and alternatives for the proposed anesthesia with the patient or authorized representative who has indicated his/her understanding and acceptance.     Dental Advisory Given  Plan Discussed with: Anesthesiologist, CRNA and Surgeon  Anesthesia Plan Comments:  Anesthesia Quick Evaluation

## 2023-07-09 ENCOUNTER — Encounter: Payer: Self-pay | Admitting: Neurosurgery

## 2023-07-09 DIAGNOSIS — M50021 Cervical disc disorder at C4-C5 level with myelopathy: Secondary | ICD-10-CM | POA: Diagnosis not present

## 2023-07-09 MED ORDER — GABAPENTIN 300 MG PO CAPS
ORAL_CAPSULE | ORAL | Status: AC
  Filled 2023-07-09: qty 1

## 2023-07-09 MED ORDER — OXYCODONE HCL 5 MG PO TABS: 5.0000 mg | ORAL_TABLET | ORAL | 0 refills | Status: DC | PRN

## 2023-07-09 MED ORDER — ACETAMINOPHEN 500 MG PO TABS
ORAL_TABLET | ORAL | Status: AC
  Filled 2023-07-09: qty 2

## 2023-07-09 MED ORDER — PANTOPRAZOLE SODIUM 40 MG PO TBEC
DELAYED_RELEASE_TABLET | ORAL | Status: AC
  Filled 2023-07-09: qty 1

## 2023-07-09 MED ORDER — CELECOXIB 200 MG PO CAPS
ORAL_CAPSULE | ORAL | Status: AC
  Filled 2023-07-09: qty 1

## 2023-07-09 MED ORDER — METHOCARBAMOL 500 MG PO TABS: 500.0000 mg | ORAL_TABLET | Freq: Four times a day (QID) | ORAL | 0 refills | Status: DC | PRN

## 2023-07-09 MED ORDER — OXYCODONE HCL 5 MG PO TABS
ORAL_TABLET | ORAL | Status: AC
  Filled 2023-07-09: qty 2

## 2023-07-09 MED ORDER — CELECOXIB 200 MG PO CAPS: 200.0000 mg | ORAL_CAPSULE | Freq: Two times a day (BID) | ORAL | 0 refills | Status: DC

## 2023-07-09 MED ORDER — ENOXAPARIN SODIUM 40 MG/0.4ML IJ SOSY
PREFILLED_SYRINGE | INTRAMUSCULAR | Status: AC
  Filled 2023-07-09: qty 0.4

## 2023-07-09 MED ORDER — SENNA 8.6 MG PO TABS
ORAL_TABLET | ORAL | Status: AC
  Filled 2023-07-09: qty 1

## 2023-07-09 MED ORDER — GABAPENTIN 100 MG PO CAPS
ORAL_CAPSULE | ORAL | Status: AC
  Filled 2023-07-09: qty 1

## 2023-07-09 MED ORDER — METHYLPREDNISOLONE 4 MG PO TBPK: ORAL_TABLET | ORAL | 0 refills | Status: DC

## 2023-07-09 MED ORDER — KETOROLAC TROMETHAMINE 15 MG/ML IJ SOLN
INTRAMUSCULAR | Status: AC
  Filled 2023-07-09: qty 1

## 2023-07-09 MED ORDER — SENNA 8.6 MG PO TABS: 1.0000 | ORAL_TABLET | Freq: Every day | ORAL | 0 refills | Status: DC | PRN

## 2023-07-09 MED ORDER — DOCUSATE SODIUM 100 MG PO CAPS
ORAL_CAPSULE | ORAL | Status: AC
  Filled 2023-07-09: qty 1

## 2023-07-09 NOTE — Progress Notes (Signed)
Physical Therapy Treatment Patient Details Name: Michele Craig MRN: [id number] DOB: Nov 28, 1970 Today's Date: 07/09/2023   History of Present Illness Michele Craig is a 52yoF who comes to [facility] on 07/08/23 for elective ACDF after persistent pain, radiculopathy, myelopathy. At baseline pt is a household AMB, no falls history but self reported imbalance. Pt reports chronic AMB/standing intolerance due to lumbar spondylosis.    PT Comments  Pt performing all mobility at supervision level or better, AMB with BRW with fair tolerance, but still limited mostly by chronic lmbar spondylosis. Demonstrates modified independence with stairs and transfers. No additional PT needs in this setting. Plan is to start with HHPT to address longstanding balance issues. I have recommended a bariatric RW for DC.    If plan is discharge home, recommend the following: A little help with walking and/or transfers;Assist for transportation;Help with stairs or ramp for entrance   Can travel by private vehicle        Equipment Recommendations   (bariatric rolling walker)    Recommendations for Other Services       Precautions / Restrictions Precautions Precautions: None Restrictions Weight Bearing Restrictions: No     Mobility  Bed Mobility Overal bed mobility: Modified Independent                  Transfers Overall transfer level: Modified independent Equipment used: Rolling walker (2 wheels) (bari) Transfers: Sit to/from Stand Sit to Stand: Supervision                Ambulation/Gait Ambulation/Gait assistance: Supervision, Modified independent (Device/Increase time) Gait Distance (Feet): 80 Feet Assistive device: Rolling walker (2 wheels) Gait Pattern/deviations: Step-through pattern Gait velocity: ~0.63m/s (clearly mving faster than yesterday)         Stairs Stairs: Yes Stairs assistance: Contact guard assist Stair Management: One rail Right, Alternating pattern Number of  Stairs: 8 General stair comments: close to baseline per pt and family   Wheelchair Mobility     Tilt Bed    Modified Rankin (Stroke Patients Only)       Balance                                            Cognition                                                Exercises      General Comments General comments (skin integrity, edema, etc.): cervical drain      Pertinent Vitals/Pain Pain Assessment Pain Assessment: 0-10 Pain Score: 8  Pain Location: neck Pain Descriptors / Indicators: Aching, Guarding, Constant    Home Living Family/patient expects to be discharged to:: Private residence Living Arrangements: Spouse/significant other;Children Available Help at Discharge: Family;Available 24 hours/day Type of Home: House Home Access: Stairs to enter Entrance Stairs-Rails: Right Entrance Stairs-Number of Steps: 4-5   Home Layout: One level Home Equipment: Hand held shower head      Prior Function            PT Goals (current goals can now be found in the care plan section) Acute Rehab PT Goals Patient Stated Goal: improve balance and safety PT Goal Formulation: With patient Time For Goal Achievement: 07/22/23 Potential to Achieve Goals: Good  Progress towards PT goals: Goals met/education completed, patient discharged from PT;Progressing toward goals    Frequency    BID      PT Plan      Co-evaluation              AM-PAC PT "6 Clicks" Mobility   Outcome Measure  Help needed turning from your back to your side while in a flat bed without using bedrails?: None Help needed moving from lying on your back to sitting on the side of a flat bed without using bedrails?: None Help needed moving to and from a bed to a chair (including a wheelchair)?: None Help needed standing up from a chair using your arms (e.g., wheelchair or bedside chair)?: None Help needed to walk in hospital room?: None Help needed  climbing 3-5 steps with a railing? : A Little 6 Click Score: 23    End of Session   Activity Tolerance: Patient tolerated treatment well;No increased pain Patient left: in bed;with family/visitor present Nurse Communication: Mobility status PT Visit Diagnosis: Difficulty in walking, not elsewhere classified (R26.2);Unsteadiness on feet (R26.81);Other abnormalities of gait and mobility (R26.89)     Time: 4401-0272 PT Time Calculation (min) (ACUTE ONLY): 11 min  Charges:    $Therapeutic Activity: 8-22 mins PT General Charges $$ ACUTE PT VISIT: 1 Visit               11:14 AM, 07/09/23 Rosamaria Lints, PT, DPT Physical Therapist - [facility]  [phone] (ASCOM)    Tonia Avino C 07/09/2023, 11:12 AM

## 2023-07-09 NOTE — Plan of Care (Signed)
  Problem: Pain Management: Goal: Pain level will decrease Outcome: Progressing   Problem: Skin Integrity: Goal: Will show signs of wound healing Outcome: Progressing   Problem: Bladder/Genitourinary: Goal: Urinary functional status for postoperative course will improve Outcome: Progressing

## 2023-07-09 NOTE — Progress Notes (Signed)
   Neurosurgery Progress Note  History: Michele Craig is s/p C4-5 and C5-6 ACDF for cervical stenosis and myelopathy  POD1: Some pain in the back of her neck and pain with swallowing.  Is tolerating a modified diet.  Physical Exam: Vitals:   07/09/23 0525 07/09/23 0804  BP: 119/70 114/68  Pulse: 76 80  Resp: 16 16  Temp: (!) 97.4 F (36.3 C) 97.9 F (36.6 C)  SpO2: 97% 94%    AA Ox3 CNI  Strength:5/5 bilateral lower extremities, biceps, triceps.  4 out of 5 in distal upper extremities.  Data:  Other tests/results: None  Assessment/Plan:  Michele Craig is a 52 year old presenting with cervical myelopathy status post C4-5 and C5-6 ACDF.  - mobilize - pain control - DVT prophylaxis - PTOT; plan to d/c home with [facility]  Manning Charity PA-C Department of Neurosurgery

## 2023-07-09 NOTE — Progress Notes (Signed)
DISCHARGE NOTE:  Pt and husband, given discharge instructions and verbalized understanding. Walker sent with pt. Pt wheeled to car by staff, husband providing transportation.

## 2023-07-09 NOTE — Discharge Summary (Signed)
Discharge Summary  Patient ID: Michele Craig MRN: [id number] DOB/AGE: Sep 28, 1971 52 y.o.  Admit date: 07/08/2023 Discharge date: 07/09/2023  Admission Diagnoses: cervical myelopathy M48.02 - cervical stenosis  Discharge Diagnoses:  Principal Problem:   Cervical myelopathy [facility]) Active Problems:   Cervical spinal stenosis   Discharged Condition: good  Hospital Course:  Michele Craig is a very pleasant 52 year old female presenting with cervical stenosis and symptoms concerning for cervical myelopathy.  She underwent a C4-5 and C5-6 ACDF.  Her intraoperative course was uncomplicated.  She was admitted overnight for drain output monitoring, pain control, and therapy evaluation.  Her drain was removed on postop day 1.  She was evaluated by physical therapy and deemed appropriate for discharge home with home health on POD1.  She was given medications for pain and muscle relaxant.  Consults: None  Significant Diagnostic Studies: none  Treatments: surgery: Above.  Please see separately dictated operative report for further details.  Discharge Exam: Blood pressure 125/79, pulse 80, temperature (!) 97.3 F (36.3 C), temperature source Oral, resp. rate 16, height 5\' 7"  (1.702 m), weight (!) 149.7 kg, SpO2 97%. AA Ox3 CNI   Strength:5/5 bilateral lower extremities, biceps, triceps.  4 out of 5 in distal upper extremities.    Disposition: Discharge disposition: 01-Home or Self Care       Discharge Instructions     Incentive spirometry RT   Complete by: As directed       Allergies as of 07/09/2023       Reactions   Penicillins Hives, Itching, Rash, Other (See Comments)   Has patient had a PCN reaction causing immediate rash, facial/tongue/throat swelling, SOB or lightheadedness with hypotension: No Has patient had a PCN reaction causing severe rash involving mucus membranes or skin necrosis: No Has patient had a PCN reaction that required hospitalization No Has patient had a  PCN reaction occurring within the last 10 years: No If all of the above answers are "NO", then may proceed with Cephalosporin use. Has patient had a PCN reaction causing immediate rash, facial/tongue/throat swelling, SOB or lightheadedness with hypotension: No  Has patient had a PCN reaction causing severe rash involving mucus membranes or skin necrosis: No  Has patient had a PCN reaction that required hospitalization No  Has patient had a PCN reaction occurring within the last 10 years: No  If all of the above answers are "NO", then may proceed with Cephalosporin use.  Other reaction(s): rash Has patient had a PCN reaction causing immediate rash, facial/tongue/throat swelling, SOB or lightheadedness with hypotension: No, Has patient had a PCN reaction causing severe rash involving mucus membranes or skin necrosis: No, Has patient had a PCN reaction that required hospitalization No, Has patient had a PCN reaction occurring within the last 10 years: No, If all of the above answers are "NO", then may proceed with Cephalosporin use., Other reaction(s): rash   Shellfish Allergy Anaphylaxis, Other (See Comments)   Crawdads   Cefadroxil Diarrhea, Other (See Comments), Rash, Hives   Abdominal pain   Zosyn [piperacillin Sod-tazobactam So] Hives, Itching   Clindamycin Hcl Other (See Comments)   Clindamycin Itching, Nausea Only, Other (See Comments), Rash   Stomach pains        Medication List     STOP taking these medications    ketorolac 10 MG tablet Commonly known as: TORADOL       TAKE these medications    Advair Diskus 250-50 MCG/DOSE Aepb Generic drug: fluticasone-salmeterol Inhale 1 puff into the  lungs 2 (two) times daily.   albuterol 108 (90 Base) MCG/ACT inhaler Commonly known as: VENTOLIN HFA Inhale 2 puffs into the lungs every 4 (four) hours as needed.   ARIPiprazole 10 MG tablet Commonly known as: ABILIFY Take 10 mg by mouth every morning.   buPROPion 150 MG 24 hr  tablet Commonly known as: WELLBUTRIN XL Take 450 mg by mouth every morning.   celecoxib 200 MG capsule Commonly known as: CELEBREX Take 1 capsule (200 mg total) by mouth every 12 (twelve) hours. What changed:  medication strength how much to take when to take this   Cimzia Prefilled 2 X 200 MG/ML prefilled syringe Generic drug: certolizumab pegol Inject 200 mg into the skin every 14 (fourteen) days.   clonazePAM 2 MG tablet Commonly known as: KLONOPIN Take 2 mg by mouth at bedtime.   DULoxetine 60 MG capsule Commonly known as: CYMBALTA Take 60 mg by mouth every morning.   fluticasone 44 MCG/ACT inhaler Commonly known as: FLOVENT HFA Inhale 1 puff into the lungs daily as needed (for shortness of breath).   fluticasone 50 MCG/ACT nasal spray Commonly known as: FLONASE Place 1 spray into both nostrils daily as needed for allergies.   gabapentin 100 MG capsule Commonly known as: NEURONTIN 400 mg 3 (three) times daily.   hydroxychloroquine 200 MG tablet Commonly known as: PLAQUENIL Take 200 mg by mouth 2 (two) times daily.   levocetirizine 5 MG tablet Commonly known as: XYZAL Take 5 mg by mouth every evening.   methocarbamol 500 MG tablet Commonly known as: ROBAXIN Take 1 tablet (500 mg total) by mouth every 6 (six) hours as needed for muscle spasms. What changed:  when to take this reasons to take this   methylPREDNISolone 4 MG Tbpk tablet Commonly known as: MEDROL DOSEPAK Take as directed on box   Mirena (52 MG) 20 MCG/DAY Iud Generic drug: levonorgestrel Mirena 20 mcg/24 hours (7 yrs) 52 mg intrauterine device  Take 1 device by intrauterine route.   montelukast 10 MG tablet Commonly known as: SINGULAIR Take 10 mg by mouth at bedtime.   omeprazole 20 MG capsule Commonly known as: PRILOSEC Take 20 mg by mouth every evening.   oxyCODONE 5 MG immediate release tablet Commonly known as: Oxy IR/ROXICODONE Take 1 tablet (5 mg total) by mouth every 4 (four)  hours as needed for moderate pain ((score 4 to 6)).   senna 8.6 MG Tabs tablet Commonly known as: SENOKOT Take 1 tablet (8.6 mg total) by mouth daily as needed for mild constipation.   SUMAtriptan 100 MG tablet Commonly known as: IMITREX Take 100 mg by mouth every 2 (two) hours as needed for migraine.   traZODone 100 MG tablet Commonly known as: DESYREL Take 100 mg by mouth at bedtime.   Vitamin D (Ergocalciferol) 1.25 MG (50000 UNIT) Caps capsule Commonly known as: DRISDOL Take 50,000 Units by mouth every Sunday.         Signed: Susanne Borders 07/14/2023, 9:50 AM

## 2023-07-09 NOTE — Evaluation (Signed)
Occupational Therapy Evaluation Patient Details Name: Michele Craig MRN: [id number] DOB: Mar 10, 1971 Today's Date: 07/09/2023   History of Present Illness Michele Craig is a 52yoF who comes to [facility] on 07/08/23 for elective ACDF after persistent pain, radiculopathy, myelopathy. At baseline pt is a household AMB, no falls history but self reported imbalance. Pt reports chronic AMB/standing intolerance due to lumbar spondylosis.   Clinical Impression   Pt seen for OT evaluation this date, s/p C4-C6 ACDF 07/08/23. Prior to hospital admission, pt was independent with mobility, ADL, and IADL (does not drive or work). No falls in past 12 months. However, has been having increased difficulty with all aspects of mobility and ADL due to neck pain. Pt lives with spouse in a single family home with 5 step to enter and 1 rail with spouse able to provide 24/7 assist/support as needed for pt. Currently pt is at supervision level with all aspects of mobility and min assist for LB ADL tasks. Pt educated in neck precautions with handout provided, self care skills, bed mobility and functional transfer training, AE/DME for bathing, dressing, and toileting needs, and home/routines modifications and falls prevention strategies to maximize safety and functional independence while minimizing falls risk and maintaining precautions. Pt verbalized understanding of all education/training provided. Handout provided to support recall and carry over of learned precautions/techniques for bed mobility, functional transfers, and self care skills. No additional skilled OT needs at this time. Will discharge in house. Upon hospital discharge, pt safe to discharge home.        If plan is discharge home, recommend the following: A little help with walking and/or transfers;A little help with bathing/dressing/bathroom;Assistance with cooking/housework;Assist for transportation;Help with stairs or ramp for entrance    Functional Status  Assessment  Patient has had a recent decline in their functional status and demonstrates the ability to make significant improvements in function in a reasonable and predictable amount of time.  Equipment Recommendations  None recommended by OT       Precautions / Restrictions Precautions Precautions: Cervical Restrictions Weight Bearing Restrictions: No      Mobility Bed Mobility Overal bed mobility: Modified Independent                  Transfers Overall transfer level: Needs assistance Equipment used: None, Rolling walker (2 wheels) Transfers: Sit to/from Stand Sit to Stand: Supervision, Contact guard assist                  Balance Overall balance assessment: Needs assistance Sitting-balance support: No upper extremity supported, Feet supported Sitting balance-Leahy Scale: Good     Standing balance support: During functional activity, No upper extremity supported Standing balance-Leahy Scale: Good                             ADL either performed or assessed with clinical judgement   ADL Overall ADL's : Needs assistance/impaired                 Upper Body Dressing : Cueing for safety;Sitting (Simulated donning bra with clasping in front)   Lower Body Dressing: Minimal assistance;Cueing for safety;Sit to/from stand;Sitting/lateral leans;With adaptive equipment Lower Body Dressing Details (indicate cue type and reason): Dons underwear/pants sitting EOB, MINA to thread pants over grip socks, education provided for safety. Performs using STS to pull pants over hips               General ADL Comments: Pt  performs UB dressing with OT during eval. Overall, minA for LB before education/AE strategies. CGA-supervision for transfers.      Pertinent Vitals/Pain Pain Assessment Pain Assessment: 0-10 Pain Score: 8  Pain Location: neck Pain Descriptors / Indicators: Aching, Guarding, Constant Pain Intervention(s): Patient requesting pain  meds-RN notified, Monitored during session, Premedicated before session     Extremity/Trunk Assessment Upper Extremity Assessment Upper Extremity Assessment: Overall WFL for tasks assessed (sensation deficits baseline UE)   Lower Extremity Assessment Lower Extremity Assessment: Defer to PT evaluation;Overall [facility] for tasks assessed   Cervical / Trunk Assessment Cervical / Trunk Assessment: Neck Surgery   Communication Communication Communication: No apparent difficulties   Cognition Arousal: Alert Behavior During Therapy: WFL for tasks assessed/performed Overall Cognitive Status: Within Functional Limits for tasks assessed                                       General Comments  cervical drain    Exercises Exercises: Other exercises Other Exercises Other Exercises: Educated on role and purpose of OT, discussion of AE strategies for UB/LB dressing for BADLs and IADLs such as kitchen        Home Living Family/patient expects to be discharged to:: Private residence Living Arrangements: Spouse/significant other;Children Available Help at Discharge: Family;Available 24 hours/day Type of Home: House Home Access: Stairs to enter [employer] of Steps: 4-5 Entrance Stairs-Rails: Right Home Layout: One level     Bathroom Shower/Tub: Producer, television/film/video: Handicapped height Bathroom Accessibility: Yes   Home Equipment: Hand held shower head          Prior Functioning/Environment Prior Level of Function : Independent/Modified Independent             Mobility Comments: no AD ADLs Comments: IND        OT Problem List: Decreased knowledge of precautions;Obesity;Pain;Impaired balance (sitting and/or standing)      OT Treatment/Interventions: Self-care/ADL training;DME and/or AE instruction;Therapeutic activities;Patient/family education;Balance training    OT Goals(Current goals can be found in the care plan section) Acute  Rehab OT Goals OT Goal Formulation: With patient Time For Goal Achievement: 07/14/23 Potential to Achieve Goals: Good  OT Frequency: Min 1X/week       AM-PAC OT "6 Clicks" Daily Activity     Outcome Measure Help from another person eating meals?: None Help from another person taking care of personal grooming?: None Help from another person toileting, which includes using toliet, bedpan, or urinal?: A Little Help from another person bathing (including washing, rinsing, drying)?: A Little Help from another person to put on and taking off regular upper body clothing?: A Little Help from another person to put on and taking off regular lower body clothing?: A Little 6 Click Score: 20   End of Session Nurse Communication: Patient requests pain meds  Activity Tolerance: Patient tolerated treatment well Patient left: in bed;with family/visitor present;with call bell/phone within reach  OT Visit Diagnosis: Other abnormalities of gait and mobility (R26.89)                Time: 0272-5366 OT Time Calculation (min): 40 min Charges:  OT General Charges $OT Visit: 1 Visit OT Evaluation $OT Eval Moderate Complexity: 1 Mod OT Treatments $Self Care/Home Management : 8-22 mins  Anishka Bushard L. Becki Mccaskill, OTR/L  07/09/23, 10:49 AM   Lise Auer Ebbie Sorenson 07/09/2023, 10:45 AM

## 2023-07-09 NOTE — Plan of Care (Signed)
  Problem: Education: Goal: Ability to verbalize activity precautions or restrictions will improve Outcome: Progressing   Problem: Activity: Goal: Ability to avoid complications of mobility impairment will improve Outcome: Progressing Goal: Ability to tolerate increased activity will improve Outcome: Progressing Goal: Will remain free from falls Outcome: Progressing   Problem: Clinical Measurements: Goal: Postoperative complications will be avoided or minimized Outcome: Progressing

## 2023-07-09 NOTE — TOC Progression Note (Signed)
Transition of Care [facility]) - Progression Note    Patient Details  Name: [place] B Fernholz MRN: [id number] Date of Birth: 1971-05-12  [facility] of [facility]) CM/SW Contact  Marlowe Sax, RN Phone Number: 07/09/2023, 10:25 AM  Clinical Narrative:     [facility] to deliver a RW to the bedside [facility] sent the [facility] referral requesting PT,   Expected Discharge Plan: Home w Home Health Services Barriers to Discharge: No Barriers Identified  Expected Discharge Plan and Services   Discharge Planning Services: CM Consult                     DME Arranged: Dan Humphreys rolling DME Agency: [facility] Date DME Agency Contacted: 07/09/23 Time DME Agency Contacted: 1024 Representative spoke with at DME Agency: Cletis [facility] Arranged: PT HH Agency: [facility] ([facility]) Date HH Agency Contacted: 07/09/23 Time HH Agency Contacted: 1024 Representative spoke with at [facility]: Barbara Cower   Social Determinants of Health (SDOH) Interventions SDOH Screenings   Food Insecurity: No Food Insecurity (07/08/2023)  Housing: Low Risk  (07/08/2023)  Transportation Needs: No Transportation Needs (07/08/2023)  Utilities: Not At Risk (07/08/2023)  Depression (PHQ2-9): High Risk (06/11/2023)  Tobacco Use: Low Risk  (07/08/2023)    Readmission Risk Interventions     No data to display

## 2023-07-12 DIAGNOSIS — Z419 Encounter for procedure for purposes other than remedying health state, unspecified: Secondary | ICD-10-CM | POA: Diagnosis not present

## 2023-07-17 NOTE — Progress Notes (Unsigned)
   REFERRING PHYSICIAN:  Park Meo, Fnp [address] [place],  [place] [address]  DOS: 07/08/23 ACDF C4-C6  HISTORY OF PRESENT ILLNESS: Michele Craig is 2 weeks status post ACDF C4-C6. Given celebrex, robaxin, oxycodone, and medrol dosepack on discharge from the hospital.   Preop right arm pain is gone! She has neck pain at the incision. She is doing okay with swallowing. Notices some slight hoarseness in her voice.   She needs a refill of oxycodone.    PHYSICAL EXAMINATION:  NEUROLOGICAL:  General: In no acute distress.   Awake, alert, oriented to person, place, and time.  Pupils equal round and reactive to light.  Facial tone is symmetric.    Strength: Side Biceps Triceps Deltoid Interossei Grip Wrist Ext. Wrist Flex.  R 5 5 5 5 5 5 5   L 5 5 5 5 5 5 5    Incision c/d/i  Imaging:  Nothing new to review.   Assessment / Plan: Michele Craig is doing well s/p above surgery. Treatment options reviewed with patient and following plan made:   - We discussed activity escalation and I have advised the patient to lift up to 10 pounds until 6 weeks after surgery (until follow up with Dr. Myer Haff).   - Reviewed wound care.  - Continue current medications including prn oxycodone, robaxin, and celebrex.  - Refill given for oxycodone. PMP reviewed and is appropriate.  - Discussed that hoarseness should improve with time.  - Follow up as scheduled in 4 weeks and prn.   Advised to contact the office if any questions or concerns arise.   Drake Leach PA-C Dept of Neurosurgery

## 2023-07-22 ENCOUNTER — Encounter: Payer: Self-pay | Admitting: Orthopedic Surgery

## 2023-07-22 ENCOUNTER — Ambulatory Visit (INDEPENDENT_AMBULATORY_CARE_PROVIDER_SITE_OTHER): Payer: 59 | Admitting: Orthopedic Surgery

## 2023-07-22 VITALS — BP 130/82 | Ht 67.5 in | Wt 330.0 lb

## 2023-07-22 DIAGNOSIS — Z981 Arthrodesis status: Secondary | ICD-10-CM

## 2023-07-22 DIAGNOSIS — G959 Disease of spinal cord, unspecified: Secondary | ICD-10-CM

## 2023-07-22 DIAGNOSIS — Z09 Encounter for follow-up examination after completed treatment for conditions other than malignant neoplasm: Secondary | ICD-10-CM

## 2023-07-22 MED ORDER — OXYCODONE HCL 5 MG PO TABS
5.0000 mg | ORAL_TABLET | ORAL | 0 refills | Status: DC | PRN
Start: 2023-07-22 — End: 2023-10-19

## 2023-07-31 ENCOUNTER — Other Ambulatory Visit: Payer: Self-pay

## 2023-07-31 ENCOUNTER — Emergency Department (HOSPITAL_COMMUNITY)
Admission: EM | Admit: 2023-07-31 | Discharge: 2023-07-31 | Disposition: A | Discharge status: Home / Self Care | Payer: 59 | Attending: Emergency Medicine | Admitting: Emergency Medicine

## 2023-07-31 ENCOUNTER — Emergency Department (HOSPITAL_COMMUNITY): Payer: 59

## 2023-07-31 ENCOUNTER — Encounter (HOSPITAL_COMMUNITY): Payer: Self-pay

## 2023-07-31 DIAGNOSIS — I10 Essential (primary) hypertension: Secondary | ICD-10-CM | POA: Insufficient documentation

## 2023-07-31 DIAGNOSIS — E119 Type 2 diabetes mellitus without complications: Secondary | ICD-10-CM | POA: Diagnosis not present

## 2023-07-31 DIAGNOSIS — R062 Wheezing: Secondary | ICD-10-CM | POA: Diagnosis present

## 2023-07-31 DIAGNOSIS — Z7951 Long term (current) use of inhaled steroids: Secondary | ICD-10-CM | POA: Insufficient documentation

## 2023-07-31 DIAGNOSIS — Z1152 Encounter for screening for COVID-19: Secondary | ICD-10-CM | POA: Insufficient documentation

## 2023-07-31 DIAGNOSIS — J4531 Mild persistent asthma with (acute) exacerbation: Secondary | ICD-10-CM | POA: Insufficient documentation

## 2023-07-31 DIAGNOSIS — J45901 Unspecified asthma with (acute) exacerbation: Secondary | ICD-10-CM

## 2023-07-31 LAB — CBC WITH DIFFERENTIAL/PLATELET
Abs Immature Granulocytes: 0.03 10*3/uL (ref 0.00–0.07)
Basophils Absolute: 0.1 10*3/uL (ref 0.0–0.1)
Basophils Relative: 1 %
Eosinophils Absolute: 0.4 10*3/uL (ref 0.0–0.5)
Eosinophils Relative: 4 %
HCT: 43.2 % (ref 36.0–46.0)
Hemoglobin: 13.9 g/dL (ref 12.0–15.0)
Immature Granulocytes: 0 %
Lymphocytes Relative: 39 %
Lymphs Abs: 3.2 10*3/uL (ref 0.7–4.0)
MCH: 29.1 pg (ref 26.0–34.0)
MCHC: 32.2 g/dL (ref 30.0–36.0)
MCV: 90.4 fL (ref 80.0–100.0)
Monocytes Absolute: 0.9 10*3/uL (ref 0.1–1.0)
Monocytes Relative: 11 %
Neutro Abs: 3.7 10*3/uL (ref 1.7–7.7)
Neutrophils Relative %: 45 %
Platelets: 296 10*3/uL (ref 150–400)
RBC: 4.78 MIL/uL (ref 3.87–5.11)
RDW: 12.7 % (ref 11.5–15.5)
WBC: 8.2 10*3/uL (ref 4.0–10.5)
nRBC: 0 % (ref 0.0–0.2)

## 2023-07-31 LAB — RESP PANEL BY RT-PCR (RSV, FLU A&B, COVID)  RVPGX2
Influenza A by PCR: NEGATIVE
Influenza B by PCR: NEGATIVE
Resp Syncytial Virus by PCR: NEGATIVE
SARS Coronavirus 2 by RT PCR: NEGATIVE

## 2023-07-31 LAB — BASIC METABOLIC PANEL
Anion gap: 7 (ref 5–15)
BUN: 12 mg/dL (ref 6–20)
CO2: 21 mmol/L — ABNORMAL LOW (ref 22–32)
Calcium: 8 mg/dL — ABNORMAL LOW (ref 8.9–10.3)
Chloride: 108 mmol/L (ref 98–111)
Creatinine, Ser: 0.58 mg/dL (ref 0.44–1.00)
GFR, Estimated: >60 mL/min (ref >60)
Glucose, Bld: 81 mg/dL (ref 70–99)
Potassium: 3.6 mmol/L (ref 3.5–5.1)
Sodium: 136 mmol/L (ref 135–145)

## 2023-07-31 MED ORDER — IPRATROPIUM BROMIDE 0.02 % IN SOLN
0.5000 mg | Freq: Once | RESPIRATORY_TRACT | Status: AC
  Administered 2023-07-31: 0.5 mg via RESPIRATORY_TRACT
  Filled 2023-07-31: qty 2.5

## 2023-07-31 MED ORDER — METHYLPREDNISOLONE SODIUM SUCC 125 MG IJ SOLR
125.0000 mg | Freq: Once | INTRAMUSCULAR | Status: AC
  Administered 2023-07-31: 125 mg via INTRAVENOUS
  Filled 2023-07-31: qty 2

## 2023-07-31 MED ORDER — ALBUTEROL SULFATE (2.5 MG/3ML) 0.083% IN NEBU
10.0000 mg | INHALATION_SOLUTION | Freq: Once | RESPIRATORY_TRACT | Status: AC
  Administered 2023-07-31: 10 mg via RESPIRATORY_TRACT
  Filled 2023-07-31: qty 12

## 2023-07-31 MED ORDER — PREDNISONE 10 MG PO TABS: 50.0000 mg | ORAL_TABLET | Freq: Every day | ORAL | 0 refills | Status: AC

## 2023-07-31 NOTE — Discharge Instructions (Addendum)
Evaluation today revealed that you likely had a mild asthma attack.  I am starting you on a 5-day course of prednisone which is a steroid.  Recommend you follow-up with your PCP.  If your shortness of breath or trouble breathing gets worse, you develop chest pain, fever, and calf tenderness or any other concerning symptom please return emergency department further evaluation.

## 2023-07-31 NOTE — ED Provider Notes (Signed)
EMERGENCY DEPARTMENT AT [facility] Provider Note   CSN: [id number] Arrival date & time: 07/31/23  5784     History  Chief Complaint  Patient presents with   Wheezing   HPI Michele Craig is a 52 y.o. female with asthma, hypertension, diabetes presenting for wheezing.  Started 2 days ago with shortness of breath.  Also states she has had a nonproductive cough.  Denies chest pain.  Symptoms are worse at night.  States she has been using her albuterol inhaler and nebulizer at home but has not been helpful.  Endorses subjective fever as well.  Denies calf tenderness, OCP use or recent immobilization.  Denies sick contacts.  Denies smoking.   Wheezing      Home Medications Prior to Admission medications   Medication Sig Start Date End Date Taking? Authorizing Provider  predniSONE (DELTASONE) 10 MG tablet Take 5 tablets (50 mg total) by mouth daily for 5 days. 07/31/23 08/05/23 Yes Roxan Hockey, Claron Rosencrans K, PA-C  ADVAIR DISKUS 250-50 MCG/DOSE AEPB Inhale 1 puff into the lungs 2 (two) times daily.  05/17/14   [provider]  albuterol (VENTOLIN HFA) 108 (90 Base) MCG/ACT inhaler Inhale 2 puffs into the lungs every 4 (four) hours as needed.    [provider]  ARIPiprazole (ABILIFY) 10 MG tablet Take 10 mg by mouth every morning. 04/18/21   [provider]  buPROPion (WELLBUTRIN XL) 150 MG 24 hr tablet Take 450 mg by mouth every morning. 05/17/18   [provider]  celecoxib (CELEBREX) 200 MG capsule Take 1 capsule (200 mg total) by mouth every 12 (twelve) hours. 07/09/23   Susanne Borders, PA  CIMZIA PREFILLED 2 X 200 MG/ML PSKT Inject 200 mg into the skin every 14 (fourteen) days. 04/22/21   [provider]  clonazePAM (KLONOPIN) 2 MG tablet Take 2 mg by mouth at bedtime. 03/03/21   [provider]  DULoxetine (CYMBALTA) 60 MG capsule Take 60 mg by mouth every morning. 03/20/22   [provider]  fluticasone (FLONASE)  50 MCG/ACT nasal spray Place 1 spray into both nostrils daily as needed for allergies. 04/17/21   [provider]  fluticasone (FLOVENT HFA) 44 MCG/ACT inhaler Inhale 1 puff into the lungs daily as needed (for shortness of breath).    [provider]  gabapentin (NEURONTIN) 100 MG capsule 400 mg 3 (three) times daily. 07/07/21   [provider]  hydroxychloroquine (PLAQUENIL) 200 MG tablet Take 200 mg by mouth 2 (two) times daily. 04/27/21   [provider]  levocetirizine (XYZAL) 5 MG tablet Take 5 mg by mouth every evening. 04/30/21   [provider]  levonorgestrel (MIRENA, 52 MG,) 20 MCG/DAY IUD Mirena 20 mcg/24 hours (7 yrs) 52 mg intrauterine device  Take 1 device by intrauterine route.    [provider]  methocarbamol (ROBAXIN) 500 MG tablet Take 1 tablet (500 mg total) by mouth every 6 (six) hours as needed for muscle spasms. 07/09/23   Susanne Borders, PA  montelukast (SINGULAIR) 10 MG tablet Take 10 mg by mouth at bedtime.    [provider]  omeprazole (PRILOSEC) 20 MG capsule Take 20 mg by mouth every evening. 07/05/21   [provider]  oxyCODONE (OXY IR/ROXICODONE) 5 MG immediate release tablet Take 1 tablet (5 mg total) by mouth every 4 (four) hours as needed for moderate pain ((score 4 to 6)). 07/22/23   Drake Leach, PA-C  senna (SENOKOT) 8.6 MG TABS  tablet Take 1 tablet (8.6 mg total) by mouth daily as needed for mild constipation. 07/09/23   Susanne Borders, PA  SUMAtriptan (IMITREX) 100 MG tablet Take 100 mg by mouth every 2 (two) hours as needed for migraine. 11/28/21   [provider]  traZODone (DESYREL) 100 MG tablet Take 100 mg by mouth at bedtime. 04/18/21   [provider]  Vitamin D, Ergocalciferol, (DRISDOL) 1.25 MG (50000 UNIT) CAPS capsule Take 50,000 Units by mouth every Sunday. 05/28/22   [provider]      Allergies    Penicillins, Shellfish allergy, Cefadroxil, Zosyn  [piperacillin sod-tazobactam so], Clindamycin hcl, and Clindamycin    Review of Systems   Review of Systems  Respiratory:  Positive for wheezing.     Physical Exam Updated Vital Signs BP (!) 133/94   Pulse 81   Temp 97.9 F (36.6 C) (Oral)   Resp 20   Ht 5\' 7"  (1.702 m)   Wt (!) 146.5 kg   SpO2 99%   BMI 50.59 kg/m  Physical Exam Vitals and nursing note reviewed.  HENT:     Head: Normocephalic and atraumatic.     Mouth/Throat:     Mouth: Mucous membranes are moist.  Eyes:     General:        Right eye: No discharge.        Left eye: No discharge.     Conjunctiva/sclera: Conjunctivae normal.  Cardiovascular:     Rate and Rhythm: Normal rate and regular rhythm.     Pulses: Normal pulses.     Heart sounds: Normal heart sounds.  Pulmonary:     Effort: Pulmonary effort is normal.     Breath sounds: Examination of the right-upper field reveals wheezing. Examination of the left-upper field reveals wheezing. Examination of the right-middle field reveals wheezing. Examination of the left-middle field reveals wheezing. Examination of the right-lower field reveals wheezing. Examination of the left-lower field reveals wheezing. Wheezing present. No decreased breath sounds, rhonchi or rales.  Abdominal:     General: Abdomen is flat.     Palpations: Abdomen is soft.  Skin:    General: Skin is warm and dry.  Neurological:     General: No focal deficit present.  Psychiatric:        Mood and Affect: Mood normal.     ED Results / Procedures / Treatments   Labs (all labs ordered are listed, but only abnormal results are displayed) Labs Reviewed  BASIC METABOLIC PANEL - Abnormal; Notable for the following components:      Result Value   CO2 21 (*)    Calcium 8.0 (*)    All other components within normal limits  RESP PANEL BY RT-PCR (RSV, FLU A&B, COVID)  RVPGX2  CBC WITH DIFFERENTIAL/PLATELET    EKG None  Radiology DG Chest Port 1 View  Result Date: 07/31/2023 CLINICAL  DATA:  52 year old female with shortness of breath. EXAM: PORTABLE CHEST 1 VIEW COMPARISON:  Chest radiographs 07/26/2022 and earlier. FINDINGS: Portable AP semi upright view at 0651 hours. New cervical ACDF hardware from last year. Lower lung volumes. Accentuation of the cardiac size which probably remains normal. Other mediastinal contours are within normal limits. Visualized tracheal air column is within normal limits. Allowing for portable technique the lungs are clear. Visible bowel-gas pattern within normal limits. IMPRESSION: Low lung volumes, otherwise no acute cardiopulmonary abnormality. Electronically Signed   By: Odessa Fleming M.D.   On: 07/31/2023 07:08    Procedures  Procedures    Medications Ordered in ED Medications  albuterol (PROVENTIL) (2.5 MG/3ML) 0.083% nebulizer solution 10 mg (10 mg Nebulization Given 07/31/23 0706)  ipratropium (ATROVENT) nebulizer solution 0.5 mg (0.5 mg Nebulization Given 07/31/23 0706)  methylPREDNISolone sodium succinate (SOLU-MEDROL) 125 mg/2 mL injection 125 mg (125 mg Intravenous Given 07/31/23 0700)    ED Course/ Medical Decision Making/ A&P                                 Medical Decision Making Amount and/or Complexity of Data Reviewed Labs: ordered. Radiology: ordered.  Risk Prescription drug management.   Initial Impression and Ddx 52 yo well appearing female presenting for wheezing and shortness of breath.  Exam notable for diffuse wheezing.  DDx includes asthma versus COPD exacerbation, CHF exacerbation, ACS, PE, pneumonia, viral URI. Patient PMH that increases complexity of ED encounter:  asthma, hypertension, diabetes   Interpretation of Diagnostics I independent reviewed and interpreted the labs as followed: No acute findings  - I independently visualized the following imaging with scope of interpretation limited to determining acute life threatening conditions related to emergency care: Chest x-ray, which revealed low lung volumes but  no acute abnormalities  Patient Reassessment and Ultimate Disposition/Management Workup and clinical findings were suggestive of mild asthma exacerbation.  After treatment, patient states she felt much better.  Started on short course of oral prednisone for home.  Patient assured me that she had plenty of albuterol as well at home.  Advised to follow-up PCP.  Discussed return precautions.  Doubt ACS given no chest pain.  Doubt PE given lack of risk factors and no chest pain and reassuring vital signs.  Doubt CHF given no lower extremity edema and no evidence of vascular congestion or pulmonary edema on x-ray.  Discharged home in condition.  Patient management required discussion with the following services or consulting groups:  None  Complexity of Problems Addressed Acute complicated illness or Injury  Additional Data Reviewed and Analyzed Further history obtained from: Further history from spouse/family member, Past medical history and medications listed in the EMR, and Prior ED visit notes  Patient Encounter Risk Assessment Prescriptions         Final Clinical Impression(s) / ED Diagnoses Final diagnoses:  Mild asthma with exacerbation, unspecified whether persistent    Rx / DC Orders ED Discharge Orders          Ordered    predniSONE (DELTASONE) 10 MG tablet  Daily        07/31/23 0907              Gareth Eagle, PA-C 07/31/23 1610    Linwood Dibbles, MD 08/01/23 [phone]

## 2023-07-31 NOTE — ED Triage Notes (Signed)
Pt states that she got a cold 2 days ago and has been wheezing. Pt has been using her inhaler and neb tx with no relief.

## 2023-08-04 ENCOUNTER — Telehealth: Payer: Self-pay | Admitting: Family Medicine

## 2023-08-04 NOTE — Telephone Encounter (Signed)
Received call from [employer] to follow up on script we sent for patient to received diabetic medical equipment; stated testing frequency was corrected but needs to be initialed. They also need chart notes supporting diagnoses with dx code.   They will refax the request as urgent which will be placed on the [facility] desk for completion.

## 2023-08-11 DIAGNOSIS — Z419 Encounter for procedure for purposes other than remedying health state, unspecified: Secondary | ICD-10-CM | POA: Diagnosis not present

## 2023-08-12 NOTE — Telephone Encounter (Signed)
Forms from [employer] w/notes given to Amber to sign. Will fax back when completed.  Per FNP, pt is not insulin and has already fax copy to [employer] [facility]. Disregard this las form

## 2023-08-12 NOTE — Telephone Encounter (Signed)
Forms from [employer] w/notes given to Amber to sign. Will fax back when completed.

## 2023-08-14 ENCOUNTER — Other Ambulatory Visit: Payer: Self-pay

## 2023-08-14 DIAGNOSIS — M5412 Radiculopathy, cervical region: Secondary | ICD-10-CM

## 2023-08-14 DIAGNOSIS — M4802 Spinal stenosis, cervical region: Secondary | ICD-10-CM

## 2023-08-14 DIAGNOSIS — G959 Disease of spinal cord, unspecified: Secondary | ICD-10-CM

## 2023-08-18 ENCOUNTER — Ambulatory Visit (INDEPENDENT_AMBULATORY_CARE_PROVIDER_SITE_OTHER): Payer: 59 | Admitting: Neurosurgery

## 2023-08-18 ENCOUNTER — Ambulatory Visit
Admission: RE | Admit: 2023-08-18 | Discharge: 2023-08-18 | Disposition: A | Discharge status: Home / Self Care | Payer: 59 | Source: Ambulatory Visit | Attending: Neurosurgery | Admitting: Neurosurgery

## 2023-08-18 ENCOUNTER — Encounter: Payer: Self-pay | Admitting: Neurosurgery

## 2023-08-18 ENCOUNTER — Ambulatory Visit
Admission: RE | Admit: 2023-08-18 | Discharge: 2023-08-18 | Disposition: A | Discharge status: Home / Self Care | Payer: 59 | Attending: Neurosurgery | Admitting: Neurosurgery

## 2023-08-18 VITALS — BP 114/84 | Temp 98.5°F | Ht 67.0 in | Wt 323.0 lb

## 2023-08-18 DIAGNOSIS — M4802 Spinal stenosis, cervical region: Secondary | ICD-10-CM | POA: Insufficient documentation

## 2023-08-18 DIAGNOSIS — M5412 Radiculopathy, cervical region: Secondary | ICD-10-CM

## 2023-08-18 DIAGNOSIS — G959 Disease of spinal cord, unspecified: Secondary | ICD-10-CM

## 2023-08-18 DIAGNOSIS — Z09 Encounter for follow-up examination after completed treatment for conditions other than malignant neoplasm: Secondary | ICD-10-CM

## 2023-08-18 NOTE — Progress Notes (Signed)
   REFERRING PHYSICIAN:  Park Meo, Fnp [address] [place],  [place] [address]  DOS: 07/08/23 ACDF C4-C6  HISTORY OF PRESENT ILLNESS: Michele Craig is status post ACDF C4-C6.   Her arm feels much better.  Her strength is returning.  She still has some issues with hoarseness in her voice, though it has improved.    PHYSICAL EXAMINATION:  NEUROLOGICAL:  General: In no acute distress.   Awake, alert, oriented to person, place, and time.  Pupils equal round and reactive to light.  Facial tone is symmetric.    Strength: Side Biceps Triceps Deltoid Interossei Grip Wrist Ext. Wrist Flex.  R 5 5 5 5 5 5 5   L 5 5 5 5 5 5 5    Incision c/d/i  Imaging:  No complications noted  Assessment / Plan: Michele Craig is doing well s/p above surgery.   She is having some postoperative changes in her voice.  If she is not improved at her next appointment, we will initiate referral to Dr. Irene Pap in [place].  She is a specialist in laryngology.  I have given her some exercises for her neck.  She will begin this to help restrengthen her neck muscles.  We will see her back in 6 weeks.     Venetia Night MD Dept of Neurosurgery

## 2023-08-24 DIAGNOSIS — Z0120 Encounter for dental examination and cleaning without abnormal findings: Secondary | ICD-10-CM | POA: Diagnosis not present

## 2023-08-28 ENCOUNTER — Other Ambulatory Visit: Payer: Self-pay | Admitting: Neurosurgery

## 2023-09-01 DIAGNOSIS — Z1384 Encounter for screening for dental disorders: Secondary | ICD-10-CM | POA: Diagnosis not present

## 2023-09-07 DIAGNOSIS — Z1384 Encounter for screening for dental disorders: Secondary | ICD-10-CM | POA: Diagnosis not present

## 2023-09-11 DIAGNOSIS — Z419 Encounter for procedure for purposes other than remedying health state, unspecified: Secondary | ICD-10-CM | POA: Diagnosis not present

## 2023-09-18 ENCOUNTER — Telehealth: Payer: Self-pay

## 2023-09-18 NOTE — Telephone Encounter (Signed)
Rec' fax from [employer] re: pt diabetic supply, However, the fax is stating that pt is on insulin. Per NP pt is not on insulin. Per NP disregard this fax since pt is not insulin.

## 2023-09-22 ENCOUNTER — Other Ambulatory Visit: Payer: Self-pay | Admitting: Orthopedic Surgery

## 2023-09-22 DIAGNOSIS — Z981 Arthrodesis status: Secondary | ICD-10-CM

## 2023-09-22 DIAGNOSIS — G959 Disease of spinal cord, unspecified: Secondary | ICD-10-CM

## 2023-09-29 ENCOUNTER — Encounter: Payer: 59 | Admitting: Orthopedic Surgery

## 2023-09-30 ENCOUNTER — Telehealth: Payer: Self-pay

## 2023-09-30 ENCOUNTER — Other Ambulatory Visit: Payer: Self-pay | Admitting: Neurosurgery

## 2023-09-30 NOTE — Telephone Encounter (Signed)
Copied from CRM [id number]. Topic: Clinical - Prescription Issue >> Sep 30, 2023  9:52 AM Tiffany H wrote: Reason for CRM: Michele Craig with [employer] called regarding patient's prescribed diabetic supplies. Michele Craig advised that the script was rejected due to corrections made near the prescription's dosage and frequency of insulin use. [employer] requests prescription be initialed where each correction was made. They will also need the assessment, last office visit notes, diagnosis and treatment plan.   [place] [phone] fax [phone]

## 2023-10-11 DIAGNOSIS — Z419 Encounter for procedure for purposes other than remedying health state, unspecified: Secondary | ICD-10-CM | POA: Diagnosis not present

## 2023-10-13 ENCOUNTER — Telehealth: Payer: Self-pay

## 2023-10-13 NOTE — Telephone Encounter (Signed)
Copied from CRM [id number]. Topic: Clinical - Prescription Issue >> Oct 12, 2023  5:40 PM Suzette B wrote:  Reason for CRM: Received a call from a diabetic supplier in regards to patient prescription stating the patient is superceeding the amount of usages on the testing equipment and when they attempted to redo the prescription for the diabetic supplies it showed where the prescription was incomplete.  Supplier stated they attempted to fax over a prescription request but the fax was unsuccessful.  Phone number is [phone](Diabetic Supplies)

## 2023-10-13 NOTE — Telephone Encounter (Signed)
Copied from CRM [id number]. Topic: Clinical - Prescription Issue >> Oct 13, 2023  9:34 AM Amy B wrote: Reason for CRM: Arianne with [employer] called regarding patient's prescribed diabetic supplies.  She cadvised that the script was rejected due to corrections made near the prescription's dosage and frequency of insulin use. [employer] requests prescription be initialed where each correction was made. They will also need the assessment, last office visit notes, diagnosis and treatment plan.  She will fax new order request to clinic.  For questions please call [phone].

## 2023-10-14 NOTE — Telephone Encounter (Signed)
Rec' fax from [facility].   Forms Signed and completed along w/last OV notes faxed this morning to [facility]

## 2023-10-15 NOTE — Progress Notes (Signed)
   REFERRING PHYSICIAN:  No referring provider defined for this encounter.  DOS: 07/08/23 ACDF C4-C6  HISTORY OF PRESENT ILLNESS:  Was doing better at last visit. Still had some hoarseness, but it was improved.   She has some numbness under her chin on her left side, this has improved but is still present. She also has intermittent right and left sided neck and shoulder pain, left is worse. Worse with turning her head and looking over her shoulders. No arm pain. No numbness, tingling, or weakness.  She continues with vocal hoarseness.   She sees outside pain management- sees [facility] in [place].    PHYSICAL EXAMINATION:  NEUROLOGICAL:  General: In no acute distress.   Awake, alert, oriented to person, place, and time.  Pupils equal round and reactive to light.  Facial tone is symmetric.    Strength: Side Biceps Triceps Deltoid Interossei Grip Wrist Ext. Wrist Flex.  R 5 5 5 5 5 5 5   L 5 5 5 5 5 5 5    Incision well healed  Imaging:  Cervical xrays dated 10/19/23:  No complications noted.  Radiology report for above xrays not yet available.   Assessment / Plan: Michele Craig is doing reasonably well s/p above surgery. Treatment options reviewed with patient and following plan made:   - PT for cervical spine to [facility] in [place].  - Referral to [facility]) for persistent hoarseness.  - Discussed that numbness under chin on left should continue to improve.  - Continue medications per pain management ([facility]). She is on flexeril 5mg  for spasms. If this does not help, she can discuss with them changing back to robaxin.  - She will follow up with Dr. Myer Haff in 6 months and prn.   Advised to contact the office if any questions or concerns arise.   I spent a total of 15 minutes in face-to-face and non-face-to-face activities related to this patient's care today including review of outside records, review of imaging, review of symptoms, physical exam, discussion of  differential diagnosis, discussion of treatment options, and documentation.   Drake Leach PA-C Dept of Neurosurgery

## 2023-10-19 ENCOUNTER — Ambulatory Visit: Payer: 59 | Admitting: Orthopedic Surgery

## 2023-10-19 ENCOUNTER — Encounter: Payer: Self-pay | Admitting: Orthopedic Surgery

## 2023-10-19 ENCOUNTER — Ambulatory Visit
Admission: RE | Admit: 2023-10-19 | Discharge: 2023-10-19 | Disposition: A | Discharge status: Home / Self Care | Payer: 59 | Source: Ambulatory Visit | Attending: Orthopedic Surgery | Admitting: Orthopedic Surgery

## 2023-10-19 ENCOUNTER — Ambulatory Visit
Admission: RE | Admit: 2023-10-19 | Discharge: 2023-10-19 | Disposition: A | Discharge status: Home / Self Care | Payer: 59 | Attending: Orthopedic Surgery | Admitting: Orthopedic Surgery

## 2023-10-19 VITALS — BP 116/78 | Ht 67.0 in | Wt 323.0 lb

## 2023-10-19 DIAGNOSIS — R490 Dysphonia: Secondary | ICD-10-CM | POA: Diagnosis not present

## 2023-10-19 DIAGNOSIS — Z981 Arthrodesis status: Secondary | ICD-10-CM | POA: Diagnosis present

## 2023-10-19 DIAGNOSIS — G959 Disease of spinal cord, unspecified: Secondary | ICD-10-CM | POA: Insufficient documentation

## 2023-10-20 ENCOUNTER — Encounter (INDEPENDENT_AMBULATORY_CARE_PROVIDER_SITE_OTHER): Payer: Self-pay | Admitting: Otolaryngology

## 2023-10-23 ENCOUNTER — Telehealth: Payer: Self-pay

## 2023-10-23 NOTE — Telephone Encounter (Signed)
Rec' form from [employer] [facility] Re: pt.   Completed form and return form via faxed along w/pt chart notes 10/14/23

## 2023-10-28 ENCOUNTER — Ambulatory Visit (INDEPENDENT_AMBULATORY_CARE_PROVIDER_SITE_OTHER): Payer: 59

## 2023-10-28 DIAGNOSIS — E1159 Type 2 diabetes mellitus with other circulatory complications: Secondary | ICD-10-CM

## 2023-10-28 LAB — HM DIABETES EYE EXAM

## 2023-10-28 NOTE — Progress Notes (Signed)
Michele Craig arrived 10/28/2023 and has given verbal consent to obtain images and complete their overdue diabetic retinal screening.  The images have been sent to an ophthalmologist or optometrist for review and interpretation.  Results will be sent back to Park Meo, FNP for review.  Patient has been informed they will be contacted when we receive the results via telephone or MyChart

## 2023-11-05 ENCOUNTER — Emergency Department (HOSPITAL_COMMUNITY)
Admission: EM | Admit: 2023-11-05 | Discharge: 2023-11-05 | Disposition: A | Discharge status: Home / Self Care | Payer: 59 | Attending: Emergency Medicine | Admitting: Emergency Medicine

## 2023-11-05 ENCOUNTER — Other Ambulatory Visit: Payer: Self-pay

## 2023-11-05 ENCOUNTER — Emergency Department (HOSPITAL_COMMUNITY): Payer: 59

## 2023-11-05 ENCOUNTER — Encounter (HOSPITAL_COMMUNITY): Payer: Self-pay

## 2023-11-05 DIAGNOSIS — W108XXA Fall (on) (from) other stairs and steps, initial encounter: Secondary | ICD-10-CM | POA: Insufficient documentation

## 2023-11-05 DIAGNOSIS — M25562 Pain in left knee: Secondary | ICD-10-CM | POA: Insufficient documentation

## 2023-11-05 DIAGNOSIS — Y92813 Airplane as the place of occurrence of the external cause: Secondary | ICD-10-CM | POA: Diagnosis not present

## 2023-11-05 DIAGNOSIS — M25561 Pain in right knee: Secondary | ICD-10-CM | POA: Diagnosis present

## 2023-11-05 DIAGNOSIS — W19XXXA Unspecified fall, initial encounter: Secondary | ICD-10-CM

## 2023-11-05 MED ORDER — KETOROLAC TROMETHAMINE 15 MG/ML IJ SOLN
15.0000 mg | Freq: Once | INTRAMUSCULAR | Status: AC
  Administered 2023-11-05: 15 mg via INTRAMUSCULAR
  Filled 2023-11-05: qty 1

## 2023-11-05 MED ORDER — ETODOLAC 400 MG PO TABS: 400.0000 mg | ORAL_TABLET | Freq: Two times a day (BID) | ORAL | 0 refills | Status: DC

## 2023-11-05 NOTE — Discharge Instructions (Signed)
Your x-ray shows severe arthritis otherwise no fractures.  You are able to walk using a walker.  DC have walker at home that she will be using.  I have sent in antiinflammatory medication called Lodine.  Do not combine this with other anti-inflammatory medications such as ibuprofen, Aleve, Celebrex.  Follow-up with the orthopedist doctor I have listed for you.  If you have any concerning symptoms return to the emergency room.

## 2023-11-05 NOTE — ED Triage Notes (Signed)
Pt was walking down steps yesterday when she missed a step and fell. Pt endorsing pain in both knees, able to bear weight walking with assistance. Denies hitting head, blood thinner, or any LOC.

## 2023-11-05 NOTE — ED Provider Notes (Signed)
[place] EMERGENCY DEPARTMENT AT [facility] Provider Note   CSN: [id number] Arrival date & time: 11/05/23  1010     History  Chief Complaint  Patient presents with   Fall    [place] B Cowgill is a 52 y.o. female.  52 year old female presents with her husband for concern of fall that occurred yesterday as she was coming down a flight of steps.  She missed a step causing her to fall directly onto both of her knees.  No head injury or loss of consciousness.  Not on anticoagulation.  She states pain came on almost immediately.  Occasionally shoots up into her groin.  The history is provided by the patient. No language interpreter was used.       Home Medications Prior to Admission medications   Medication Sig Start Date End Date Taking? Authorizing Provider  ADVAIR DISKUS 250-50 MCG/DOSE AEPB Inhale 1 puff into the lungs 2 (two) times daily.  05/17/14   [provider]  albuterol (VENTOLIN HFA) 108 (90 Base) MCG/ACT inhaler Inhale 2 puffs into the lungs every 4 (four) hours as needed.    [provider]  ARIPiprazole (ABILIFY) 10 MG tablet Take 10 mg by mouth every morning. 04/18/21   [provider]  budesonide-formoterol (SYMBICORT) 160-4.5 MCG/ACT inhaler Inhalation 03/20/23   [provider]  buPROPion (WELLBUTRIN XL) 150 MG 24 hr tablet Take 450 mg by mouth every morning. 05/17/18   [provider]  cefadroxil (DURICEF) 500 MG capsule Take 500 mg by mouth 2 (two) times daily. 03/20/23   [provider]  celecoxib (CELEBREX) 200 MG capsule TAKE 1 CAPSULE(200 MG) BY MOUTH EVERY 12 HOURS 08/31/23   Susanne Borders, PA  CIMZIA PREFILLED 2 X 200 MG/ML PSKT Inject 200 mg into the skin every 14 (fourteen) days. 04/22/21   [provider]  clonazePAM (KLONOPIN) 2 MG tablet Take 2 mg by mouth at bedtime. 03/03/21   [provider]  cyclobenzaprine (FLEXERIL) 5 MG tablet Take 5 mg by mouth 3 (three) times daily as  needed.    [provider]  diclofenac (VOLTAREN) 50 MG EC tablet Take 50 mg by mouth 2 (two) times daily. 03/20/23   [provider]  DULoxetine (CYMBALTA) 60 MG capsule Take 60 mg by mouth every morning. 03/20/22   [provider]  fluticasone (FLONASE) 50 MCG/ACT nasal spray Place 1 spray into both nostrils daily as needed for allergies. 04/17/21   [provider]  fluticasone (FLOVENT HFA) 44 MCG/ACT inhaler Inhale 1 puff into the lungs daily as needed (for shortness of breath).    [provider]  gabapentin (NEURONTIN) 100 MG capsule 400 mg 3 (three) times daily. 07/07/21   [provider]  hydrochlorothiazide (HYDRODIURIL) 25 MG tablet Take 25 mg by mouth daily.    [provider]  hydroxychloroquine (PLAQUENIL) 200 MG tablet Take 200 mg by mouth 2 (two) times daily. 04/27/21   [provider]  levalbuterol Pauline Aus) 0.63 MG/3ML nebulizer solution Take 0.63 mg by nebulization every 8 (eight) hours as needed. 03/20/23   [provider]  levocetirizine (XYZAL) 5 MG tablet Take 5 mg by mouth every evening. 04/30/21   [provider]  levonorgestrel (MIRENA, 52 MG,) 20 MCG/DAY IUD Mirena 20 mcg/24 hours (7 yrs) 52 mg intrauterine device  Take 1 device by intrauterine route.    [provider]  montelukast (SINGULAIR) 10 MG tablet Take 10 mg by mouth at bedtime.  [provider]  omeprazole (PRILOSEC) 20 MG capsule Take 20 mg by mouth every evening. 07/05/21   [provider]  pregabalin (LYRICA) 75 MG capsule Take 75 mg by mouth daily. 03/20/23   [provider]  senna (SENOKOT) 8.6 MG TABS tablet Take 1 tablet (8.6 mg total) by mouth daily as needed for mild constipation. 07/09/23   Susanne Borders, PA  SUMAtriptan (IMITREX) 100 MG tablet Take 100 mg by mouth every 2 (two) hours as needed for migraine. 11/28/21   [provider]  traZODone (DESYREL) 100 MG tablet Take  100 mg by mouth at bedtime. 04/18/21   [provider]  Vitamin D, Ergocalciferol, (DRISDOL) 1.25 MG (50000 UNIT) CAPS capsule Take 50,000 Units by mouth every Sunday. 05/28/22   [provider]      Allergies    Penicillins, Shellfish allergy, Cefadroxil, Zosyn [piperacillin sod-tazobactam so], Clindamycin hcl, and Clindamycin    Review of Systems   Review of Systems  Constitutional:  Negative for chills and fever.  Respiratory:  Negative for shortness of breath.   Cardiovascular:  Negative for chest pain.  Gastrointestinal:  Negative for abdominal pain.  Musculoskeletal:  Positive for arthralgias.  All other systems reviewed and are negative.   Physical Exam Updated Vital Signs BP (!) 115/91 (BP Location: Right Arm)   Pulse 80   Temp 98.8 F (37.1 C) (Oral)   Resp 17   Ht 5\' 7"  (1.702 m)   Wt (!) 149.7 kg   SpO2 97%   BMI 51.69 kg/m  Physical Exam Vitals and nursing note reviewed.  Constitutional:      General: She is not in acute distress.    Appearance: Normal appearance. She is not ill-appearing.  HENT:     Head: Normocephalic and atraumatic.     Nose: Nose normal.  Eyes:     Conjunctiva/sclera: Conjunctivae normal.  Pulmonary:     Effort: Pulmonary effort is normal. No respiratory distress.  Musculoskeletal:        General: No deformity. Normal range of motion.     Comments: Patient with complaints of motion in both knees.  Neurovascularly intact in bilateral lower extremities.  Hips without tenderness to palpation.  Knees without tenderness palpation.  Ankles with full range of motion without tenderness palpation.  Ambulating well with use of walker.  She was also able to ambulate with minimal assistance from her husband.  Skin:    Findings: No rash.  Neurological:     Mental Status: She is alert.     ED Results / Procedures / Treatments   Labs (all labs ordered are listed, but only abnormal results are displayed) Labs Reviewed - No data to  display  EKG None  Radiology DG Knee Complete 4 Views Right Result Date: 11/05/2023 CLINICAL DATA:  Pain after fall EXAM: RIGHT KNEE - COMPLETE 4 VIEW COMPARISON:  07/04/2018 x-ray FINDINGS: No fracture or dislocation. Preserved joint spaces and bone mineralization. Moderate osteophytes along the patellofemoral joint. Trace along the medial compartment. No joint effusion on lateral view. IMPRESSION: Tricompartmental degenerative changes. Electronically Signed   By: Karen Kays M.D.   On: 11/05/2023 11:22   DG Knee Complete 4 Views Left Result Date: 11/05/2023 CLINICAL DATA:  Fall 1 day ago.  Pain EXAM: LEFT KNEE - COMPLETE 4 VIEW COMPARISON:  None Available. FINDINGS: No fracture or dislocation. Preserved joint spaces. There are however tricompartmental moderate osteophytes. No joint effusion on lateral view. Preserved bone mineralization. IMPRESSION: Tricompartment degenerative  changes.  Osteophyte formation Electronically Signed   By: Karen Kays M.D.   On: 11/05/2023 11:21    Procedures Procedures    Medications Ordered in ED Medications  ketorolac (TORADOL) 15 MG/ML injection 15 mg (15 mg Intramuscular Given 11/05/23 1120)    ED Course/ Medical Decision Making/ A&P                                 Medical Decision Making Amount and/or Complexity of Data Reviewed Radiology: ordered.  Risk Prescription drug management.   52 year old female presents today for concern of fall.  Complains of left knee pain.  X-rays obtained.  Shows severe arthritis otherwise no acute fracture or other bony abnormality.  She was able to ambulate using a walker.  She states that she has a walker at home from a previous spine fusion that she underwent.  Orthopedic referral given.  Lodine prescribed.  Discussed not to combine this with other anti-inflammatory medications.  Return precaution discussed.  Patient voices understanding and is in agreement with plan.   Final Clinical Impression(s) / ED  Diagnoses Final diagnoses:  Fall, initial encounter  Acute pain of both knees    Rx / DC Orders ED Discharge Orders          Ordered    etodolac (LODINE) 400 MG tablet  2 times daily        11/05/23 1222              Marita Kansas, PA-C 11/05/23 1226    Horton, Clabe Seal, DO 11/05/23 1553

## 2023-11-10 ENCOUNTER — Ambulatory Visit: Payer: 59 | Attending: Orthopedic Surgery | Admitting: Physical Therapy

## 2023-11-11 DIAGNOSIS — Z419 Encounter for procedure for purposes other than remedying health state, unspecified: Secondary | ICD-10-CM | POA: Diagnosis not present

## 2023-11-26 ENCOUNTER — Ambulatory Visit: Payer: 59 | Admitting: Obstetrics and Gynecology

## 2023-11-30 ENCOUNTER — Institutional Professional Consult (permissible substitution) (INDEPENDENT_AMBULATORY_CARE_PROVIDER_SITE_OTHER): Payer: 59 | Admitting: Otolaryngology

## 2023-12-04 DIAGNOSIS — M5412 Radiculopathy, cervical region: Secondary | ICD-10-CM | POA: Diagnosis not present

## 2023-12-04 DIAGNOSIS — E1169 Type 2 diabetes mellitus with other specified complication: Secondary | ICD-10-CM | POA: Diagnosis not present

## 2023-12-04 DIAGNOSIS — R300 Dysuria: Secondary | ICD-10-CM | POA: Diagnosis not present

## 2023-12-04 DIAGNOSIS — K219 Gastro-esophageal reflux disease without esophagitis: Secondary | ICD-10-CM | POA: Diagnosis not present

## 2023-12-04 DIAGNOSIS — N390 Urinary tract infection, site not specified: Secondary | ICD-10-CM | POA: Diagnosis not present

## 2023-12-07 ENCOUNTER — Telehealth (INDEPENDENT_AMBULATORY_CARE_PROVIDER_SITE_OTHER): Payer: 59 | Admitting: Family Medicine

## 2023-12-07 ENCOUNTER — Other Ambulatory Visit: Payer: 59

## 2023-12-07 DIAGNOSIS — N3090 Cystitis, unspecified without hematuria: Secondary | ICD-10-CM

## 2023-12-07 MED ORDER — SULFAMETHOXAZOLE-TRIMETHOPRIM 800-160 MG PO TABS: 1.0000 | ORAL_TABLET | Freq: Two times a day (BID) | ORAL | 0 refills | Status: DC

## 2023-12-07 NOTE — Progress Notes (Signed)
Subjective:    Patient ID: Michele Craig, female    DOB: 12/24/1970, 53 y.o.   MRN: [id number]  HPI Patient is a 53 year old African-American female being seen today via video visit for presumed urinary tract infection.  The patient is currently at home.  I am currently in my office.  Video visit began at 1015.  Video visit concluded at 1022.  The patient consents to be seen via video.  She states that she has been dealing with dysuria for 2 weeks.  She describes it as a burning pain after she urinates.  She denies any blood in her urine.  She denies any fever.  She denies any nausea or vomiting.  She does have some left-sided low back pain.  She has a history of rheumatoid arthritis and is immunocompromise.  She also has history of allergies to penicillin and cephalosporin. Past Medical History:  Diagnosis Date   Anemia    Anxiety    Arthritis    Complication of anesthesia    with carpal tunnel syndrome the block didn't work well, she could feel things during the surgery   Depression    Family history of adverse reaction to anesthesia    unknown, pt is adopted   Fibromyalgia    Foot pain    GERD (gastroesophageal reflux disease)    Headache    Hypertension    IBS (irritable bowel syndrome)    after cholecystectomy and then it went away after Gastric sleeve   LBBB (left bundle branch block)    Moderate persistent asthma    OSA (obstructive sleep apnea)    does not use cpap   RA (rheumatoid arthritis) (HCC)    Shortness of breath dyspnea    only when her asthma is flaring up   Type 2 diabetes mellitus (HCC)    pt states after her gastric sleeve procedure she is no longer on medications and is not checked for it   Past Surgical History:  Procedure Laterality Date   ANTERIOR CERVICAL DECOMP/DISCECTOMY FUSION N/A 07/08/2023   Procedure: C4-6 ANTERIOR CERVICAL DISCECTOMY AND FUSION (FORGE);  Surgeon: Venetia Night, MD;  Location: [facility] ORS;  Service: Neurosurgery;  Laterality:  N/A;   CARPAL TUNNEL RELEASE     CARPAL TUNNEL RELEASE Right 04/04/2016   Procedure: RIGHT CARPAL TUNNEL RELEASE;  Surgeon: Frederico Hamman, MD;  Location: [facility];  Service: Orthopedics;  Laterality: Right;  Synovectomy    CESAREAN SECTION     x 2   CHOLECYSTECTOMY     deuodenal switch     DILATION AND CURETTAGE OF UTERUS     LAPAROSCOPIC GASTRIC SLEEVE RESECTION     LEFT HEART CATH AND CORONARY ANGIOGRAPHY N/A 06/05/2022   Procedure: LEFT HEART CATH AND CORONARY ANGIOGRAPHY;  Surgeon: Lyn Records, MD;  Location: [facility] INVASIVE CV LAB;  Service: Cardiovascular;  Laterality: N/A;   TONSILLECTOMY     Current Outpatient Medications on File Prior to Visit  Medication Sig Dispense Refill   ADVAIR DISKUS 250-50 MCG/DOSE AEPB Inhale 1 puff into the lungs 2 (two) times daily.      albuterol (VENTOLIN HFA) 108 (90 Base) MCG/ACT inhaler Inhale 2 puffs into the lungs every 4 (four) hours as needed.     ARIPiprazole (ABILIFY) 10 MG tablet Take 10 mg by mouth every morning.     budesonide-formoterol (SYMBICORT) 160-4.5 MCG/ACT inhaler Inhalation     buPROPion (WELLBUTRIN XL) 150 MG 24 hr tablet Take 450 mg by mouth every morning.  cefadroxil (DURICEF) 500 MG capsule Take 500 mg by mouth 2 (two) times daily.     CIMZIA PREFILLED 2 X 200 MG/ML PSKT Inject 200 mg into the skin every 14 (fourteen) days.     clonazePAM (KLONOPIN) 2 MG tablet Take 2 mg by mouth at bedtime.     cyclobenzaprine (FLEXERIL) 5 MG tablet Take 5 mg by mouth 3 (three) times daily as needed.     diclofenac (VOLTAREN) 50 MG EC tablet Take 50 mg by mouth 2 (two) times daily.     DULoxetine (CYMBALTA) 60 MG capsule Take 60 mg by mouth every morning.     etodolac (LODINE) 400 MG tablet Take 1 tablet (400 mg total) by mouth 2 (two) times daily. 20 tablet 0   fluticasone (FLONASE) 50 MCG/ACT nasal spray Place 1 spray into both nostrils daily as needed for allergies.     fluticasone (FLOVENT HFA) 44 MCG/ACT inhaler Inhale 1 puff into the  lungs daily as needed (for shortness of breath).     gabapentin (NEURONTIN) 100 MG capsule 400 mg 3 (three) times daily.     hydrochlorothiazide (HYDRODIURIL) 25 MG tablet Take 25 mg by mouth daily.     hydroxychloroquine (PLAQUENIL) 200 MG tablet Take 200 mg by mouth 2 (two) times daily.     levalbuterol (XOPENEX) 0.63 MG/3ML nebulizer solution Take 0.63 mg by nebulization every 8 (eight) hours as needed.     levocetirizine (XYZAL) 5 MG tablet Take 5 mg by mouth every evening.     levonorgestrel (MIRENA, 52 MG,) 20 MCG/DAY IUD Mirena 20 mcg/24 hours (7 yrs) 52 mg intrauterine device  Take 1 device by intrauterine route.     montelukast (SINGULAIR) 10 MG tablet Take 10 mg by mouth at bedtime.     omeprazole (PRILOSEC) 20 MG capsule Take 20 mg by mouth every evening.     pregabalin (LYRICA) 75 MG capsule Take 75 mg by mouth daily.     senna (SENOKOT) 8.6 MG TABS tablet Take 1 tablet (8.6 mg total) by mouth daily as needed for mild constipation. 60 tablet 0   SUMAtriptan (IMITREX) 100 MG tablet Take 100 mg by mouth every 2 (two) hours as needed for migraine.     traZODone (DESYREL) 100 MG tablet Take 100 mg by mouth at bedtime.     Vitamin D, Ergocalciferol, (DRISDOL) 1.25 MG (50000 UNIT) CAPS capsule Take 50,000 Units by mouth every Sunday.     No current facility-administered medications on file prior to visit.   Allergies  Allergen Reactions   Penicillins Hives, Itching, Rash and Other (See Comments)    Has patient had a PCN reaction causing immediate rash, facial/tongue/throat swelling, SOB or lightheadedness with hypotension: No  Has patient had a PCN reaction causing severe rash involving mucus membranes or skin necrosis: No  Has patient had a PCN reaction that required hospitalization No  Has patient had a PCN reaction occurring within the last 10 years: No  If all of the above answers are "NO", then may proceed with Cephalosporin use.  Has patient had a PCN reaction causing  immediate rash, facial/tongue/throat swelling, SOB or lightheadedness with hypotension: No  Has patient had a PCN reaction causing severe rash involving mucus membranes or skin necrosis: No  Has patient had a PCN reaction that required hospitalization No  Has patient had a PCN reaction occurring within the last 10 years: No  If all of the above answers are "NO", then may proceed with Cephalosporin use.  Other reaction(s): rash  Has patient had a PCN reaction causing immediate rash, facial/tongue/throat swelling, SOB or lightheadedness with hypotension: No, Has patient had a PCN reaction causing severe rash involving mucus membranes or skin necrosis: No, Has patient had a PCN reaction that required hospitalization No, Has patient had a PCN reaction occurring within the last 10 years: No, If all of the above answers are "NO", then may proceed with Cephalosporin use., Other reaction(s): rash   Shellfish Allergy Anaphylaxis and Other (See Comments)    Crawdads   Cefadroxil Diarrhea, Other (See Comments), Rash and Hives    Abdominal pain   Zosyn [Piperacillin Sod-Tazobactam So] Hives and Itching   Clindamycin Hcl Other (See Comments)   Clindamycin Itching, Nausea Only, Other (See Comments) and Rash    Stomach pains       Review of Systems  All other systems reviewed and are negative.      Objective:   Physical Exam  Unable to perform a physical exam due to this being a video visit.      Assessment & Plan:  Cystitis Symptoms sound like cystitis.  Recommended Bactrim double strength tablets twice daily for 7 days.  Push fluids.  Use Tylenol for pain.  Recheck if no better in 48 hours or sooner if worsening

## 2023-12-14 ENCOUNTER — Ambulatory Visit: Payer: 59 | Admitting: Family Medicine

## 2024-01-04 DIAGNOSIS — E782 Mixed hyperlipidemia: Secondary | ICD-10-CM | POA: Insufficient documentation

## 2024-01-04 DIAGNOSIS — E559 Vitamin D deficiency, unspecified: Secondary | ICD-10-CM | POA: Insufficient documentation

## 2024-01-04 DIAGNOSIS — F411 Generalized anxiety disorder: Secondary | ICD-10-CM | POA: Insufficient documentation

## 2024-01-05 NOTE — Telephone Encounter (Signed)
 Spoke w/Timothy w/[employer] supplier reg: pt's rx for diabetic supply. Per Marcial Pacas, there is no new orders for this pt currently.

## 2024-04-15 ENCOUNTER — Other Ambulatory Visit: Payer: Self-pay

## 2024-04-15 DIAGNOSIS — G959 Disease of spinal cord, unspecified: Secondary | ICD-10-CM

## 2024-04-19 ENCOUNTER — Ambulatory Visit: Payer: 59 | Admitting: Neurosurgery

## 2024-06-07 ENCOUNTER — Ambulatory Visit
Admission: RE | Admit: 2024-06-07 | Discharge: 2024-06-07 | Disposition: A | Discharge status: Home / Self Care | Source: Ambulatory Visit | Attending: Neurosurgery | Admitting: Neurosurgery

## 2024-06-07 ENCOUNTER — Ambulatory Visit (INDEPENDENT_AMBULATORY_CARE_PROVIDER_SITE_OTHER): Admitting: Neurosurgery

## 2024-06-07 VITALS — BP 128/80 | Ht 67.0 in | Wt 330.0 lb

## 2024-06-07 DIAGNOSIS — G959 Disease of spinal cord, unspecified: Secondary | ICD-10-CM

## 2024-06-07 DIAGNOSIS — Z981 Arthrodesis status: Secondary | ICD-10-CM | POA: Diagnosis not present

## 2024-06-07 NOTE — Progress Notes (Signed)
   REFERRING PHYSICIAN:  Kayla Jeoffrey RAMAN, Fnp [address] [place],  [place] [address]  DOS: 07/08/23 ACDF C4-C6  HISTORY OF PRESENT ILLNESS:  06/07/2024 Ms. Toutant is doing well.  She has intermittent R hand numbness (chronic carpal tunnel).  PHYSICAL EXAMINATION:  NEUROLOGICAL:  General: In no acute distress.   Awake, alert, oriented to person, place, and time.  Pupils equal round and reactive to light.  Facial tone is symmetric.    Strength: Side Biceps Triceps Deltoid Interossei Grip Wrist Ext. Wrist Flex.  R 5 5 5 5 5 5 5   L 5 5 5 5 5 5 5    Incision well healed  Imaging:  Cervical xrays dated 10/19/23:  No complications noted.  Xrays today - stable  Assessment / Plan: [place] B Julian is doing well s/p above surgery.   I will see her back on an as-needed basis.    I spent a total of 10 minutes in this patient's care today. This time was spent reviewing pertinent records including imaging studies, obtaining and confirming history, performing a directed evaluation, formulating and discussing my recommendations, and documenting the visit within the medical record.    [facility]

## 2024-06-13 ENCOUNTER — Encounter: Payer: Self-pay | Admitting: Neurosurgery

## 2024-08-18 ENCOUNTER — Ambulatory Visit: Admitting: Nurse Practitioner

## 2024-09-06 ENCOUNTER — Ambulatory Visit: Admitting: Nurse Practitioner

## 2024-09-16 ENCOUNTER — Ambulatory Visit: Payer: Self-pay | Admitting: Nurse Practitioner

## 2024-09-25 NOTE — ED Provider Notes (Signed)
 Emergency Department Provider Note    ED Clinical Impression   Final diagnoses:  Exacerbation of asthma, unspecified asthma severity, unspecified whether persistent (HHS-HCC) (Primary)  Bronchitis  Peripheral edema    ED Assessment/Plan    Condition: Stable Disposition: Discharge  This chart has been completed using Dragon Medical Dictation software, and while attempts have been made to ensure accuracy, certain words and phrases may not be transcribed as intended.   History   Chief Complaint  Patient presents with   Shortness of Breath   Leg Swelling   HPI  Michele Craig is a 53 y.o. female  who presents today to the  emergency department complaining of cough, congestion, dyspnea for the past 2 to 3 weeks.  Patient states she has had a cold sore at this time and it has made her asthma bad.  She also describes bilateral lower extremity swelling, referring to edema.  A cough is worse with clear sputum.  She denies any chest pain.  She denies any pain.    Allergies: is allergic to penicillins. Medications: has a current medication list which includes the following long-term medication(s): advair  diskus, albuterol , albuterol , duloxetine , and furosemide . PMHx:  has no past medical history on file. PSHx:  has no past surgical history on file. SocHx:  reports that she does not drink alcohol and does not use drugs. Allergies, Medications, Medical, Surgical, and Social History were reviewed as documented above.   Social Drivers of Health with Concerns   Alcohol Use: Not on file  Housing: Not on file  Physical Activity: Not on file  Stress: Not on file  Interpersonal Safety: Not on file  Substance Use: Not on file (09/25/2024)  Social Connections: Not on file  Financial Resource Strain: Not on file  Health Literacy: Not on file  Internet Connectivity: Not on file     Review Of  Systems  Review of Systems  Constitutional:  Negative for fever.  HENT:  Positive for congestion.   Respiratory:  Positive for cough and shortness of breath. Negative for chest tightness.   Cardiovascular:  Positive for leg swelling. Negative for chest pain.  Gastrointestinal:  Negative for abdominal pain.  Skin:  Negative for color change.  Psychiatric/Behavioral:  Negative for behavioral problems.   All other systems reviewed and are negative.   Physical Exam   BP 108/84   Pulse 74   Temp 37.2 C (99 F) (Oral)   Resp 14   Ht 170.2 cm (5' 7)   Wt (!) 145.2 kg (320 lb)   SpO2 100%   BMI 50.12 kg/m   Physical Exam Vitals and nursing note reviewed.  Constitutional:      General: She is not in acute distress. HENT:     Head: Normocephalic.  Eyes:     Conjunctiva/sclera: Conjunctivae normal.  Cardiovascular:     Rate and Rhythm: Regular rhythm.     Pulses: Normal pulses.     Heart sounds: Normal heart sounds.  Pulmonary:     Effort: No respiratory distress.     Breath sounds: Wheezing present.     Comments: There are diffuse scattered wheezes appreciated. Abdominal:     General: There is no distension.     Tenderness: There is no right CVA tenderness or left CVA tenderness.  Musculoskeletal:  General: Swelling present. No tenderness or deformity.     Right lower leg: Edema present.     Left lower leg: Edema present.     Comments: Bilateral lower extremity pitting edema.  No calf tenderness.  Normal distal neurovascular exam.  Skin:    General: Skin is warm.     Capillary Refill: Capillary refill takes 2 to 3 seconds.     Comments: Normal cap refill.  Neurological:     General: No focal deficit present.  Psychiatric:        Mood and Affect: Mood normal.     ED Course  Medical Decision Making Differential diagnosis includes influenza versus pneumonia versus COVID-19 infection versus asthma exacerbation versus CHF.  Strongly doubt pulmonary embolus.   Will screen with a D-dimer.  Will treat patient symptomatically.  9:20 AM Patient is doing well.  Feels better after the breathing treatment.  11:25 AM Patient reevaluated.  Patient is doing well.  Labs and imaging reviewed and discussed.  D-dimer is negative.  She is diuresing appropriately.  Will put her on a short course of Lasix  for peripheral edema.  Chest x-ray did not show any overt CHF.  She is stable for discharge.  I have reviewed my clinical findings and studies and my clinical impression with the patient. The patient has expressed understanding that at this time there is no evidence for a more malignant underlying process, but the patient also understands that early in the process of a condition such as this, an initial workup can be falsely reassuring. I have counseled the patient and discussed follow-up with the patient, stressing the importance of appropriate follow-up. I have also counseled the patient to return if worse or any concerns. Routine discharge counseling was given to the patient and the patient understands that worsening, changing or persistent symptoms should prompt an immediate call or follow up with their primary physician or return to the emergency department for reevaluation. Patient has expressed understanding.     Problems Addressed: Bronchitis: acute illness or injury that poses a threat to life or bodily functions Exacerbation of asthma, unspecified asthma severity, unspecified whether persistent (HHS-HCC): acute illness or injury that poses a threat to life or bodily functions Peripheral edema: acute illness or injury that poses a threat to life or bodily functions  Amount and/or Complexity of Data Reviewed Labs: ordered. Decision-making details documented in ED Course. Radiology: ordered. Decision-making details documented in ED Course.  Risk Prescription drug management.     Procedures   No results found for this visit on 09/25/24 (from the past  4464 hours).   ED Results Results for orders placed or performed during the hospital encounter of 09/25/24  Influenza/ RSV/COVID PCR   Specimen: Nasopharyngeal Swab  Result Value Ref Range   SARS-CoV-2 PCR Negative Negative   Influenza A Negative Negative   Influenza B Negative Negative   RSV Negative Negative  Strep Group A Rapid   Specimen: Throat  Result Value Ref Range   Rapid Group A Strep PCR Not Detected Not Detected  Pro-BNP  Result Value Ref Range   PRO-BNP 46.0 0.0 - 125.0 pg/mL  Comprehensive Metabolic Panel  Result Value Ref Range   Sodium 144 135 - 145 mmol/L   Potassium 3.7 3.5 - 5.0 mmol/L   Chloride 111 (H) 98 - 107 mmol/L   CO2 26.6 21.0 - 32.0 mmol/L   Anion Gap 6 3 - 11 mmol/L   BUN 11 8 - 20 mg/dL   Creatinine  0.83 0.60 - 1.10 mg/dL   BUN/Creatinine Ratio 13    eGFR CKD-EPI (2021) Female 84 >=60 mL/min/1.70m2   Glucose 87 70 - 179 mg/dL   Calcium 7.8 (L) 8.5 - 10.1 mg/dL   Albumin 2.7 (L) 3.5 - 5.0 g/dL   Total Protein 6.0 6.0 - 8.0 g/dL   Total Bilirubin 0.4 0.3 - 1.2 mg/dL   AST 20 15 - 40 U/L   ALT 33 12 - 78 U/L   Alkaline Phosphatase 110 46 - 116 U/L  Magnesium   Result Value Ref Range   Magnesium  1.9 1.6 - 2.6 mg/dL  D-Dimer, Quantitative  Result Value Ref Range   D-Dimer 385 <=500 ng/mL FEU  CBC w/ Differential  Result Value Ref Range   WBC 6.5 4.0 - 10.5 10*9/L   RBC 4.15 3.80 - 5.10 10*12/L   HGB 12.2 11.5 - 15.0 g/dL   HCT 63.6 65.9 - 55.9 %   MCV 87.5 80.0 - 98.0 fL   MCH 29.4 27.0 - 34.0 pg   MCHC 33.6 32.0 - 36.0 g/dL   RDW 86.3 88.4 - 85.4 %   MPV 9.5 7.4 - 10.4 fL   Platelet 329 140 - 415 10*9/L   Neutrophils % 40.3 %   Lymphocytes % 44.7 %   Monocytes % 13.0 %   Eosinophils % 1.2 %   Basophils % 0.6 %   Absolute Neutrophils 2.6 1.8 - 7.8 10*9/L   Absolute Lymphocytes 2.9 0.7 - 4.5 10*9/L   Absolute Monocytes 0.8 0.1 - 1.0 10*9/L   Absolute Eosinophils 0.1 0.0 - 0.4 10*9/L   Absolute Basophils 0.0 0.0 - 0.2 10*9/L    XR Chest Portable Result Date: 09/25/2024 EXAMINATION: Chest radiograph  HISTORY:   Shortness of breath.   TECHNIQUE: Portable upright AP radiograph of the chest.  COMPARISON:  No comparison available  FINDINGS:  Image quality compromised by large body habitus and portable technique. The heart is enlarged. Pulmonary vascularity is within normal limits. No focal consolidation. No pneumothorax or pleural effusion. No acute osseous abnormalities. ACDF hardware partially imaged.    Cardiomegaly with no acute airspace disease.  Signed (Electronic Signature): 09/25/2024 8:22 AM Signed By: Jeralyn Moss, MD   Medications Administered:  Medications  ipratropium-albuterol  (DUO-NEB) 0.5-2.5 mg/3 mL nebulizer solution 3 mL (3 mL Nebulization Given 09/25/24 0752)  methylPREDNISolone  sodium succinate (SOLU-Medrol ) injection 125 mg (125 mg Intravenous Given 09/25/24 0751)  furosemide  (LASIX ) injection 20 mg (20 mg Intravenous Given 09/25/24 0752)    Discharge Medications (Medications Prescribed during this  ED visit and Patient's Home Medications) :    Your Medication List     START taking these medications    benzonatate 100 MG capsule Commonly known as: TESSALON Take 1 capsule (100 mg total) by mouth Three (3) times a day as needed for cough for up to 7 days.   furosemide  20 MG tablet Commonly known as: LASIX  Take 1 tablet (20 mg total) by mouth daily.   predniSONE  20 MG tablet Commonly known as: DELTASONE  Take 3 tablets (60 mg total) by mouth daily for 5 days.       ASK your doctor about these medications    ADVAIR  DISKUS 250-50 mcg/dose diskus Generic drug: fluticasone  propion-salmeterol   albuterol  90 mcg/actuation inhaler Commonly known as: PROVENTIL  HFA;VENTOLIN  HFA Inhale.   albuterol  90 mcg/actuation inhaler Commonly known as: PROVENTIL  HFA;VENTOLIN  HFA Frequency:four times daily, as needed   Dosage:108 (90 Base)   MCG/ACT  Instructions:Proventil  HFA 108 (90  Base)MCG/ACT,  2 (two) puff(s) puff(s) four times daily, as needed  Note:   cetirizine  1 mg/mL syrup Commonly known as: ZYRTEC  10 mg.   DULoxetine  20 MG capsule Commonly known as: CYMBALTA  60 mg.   meloxicam 15 MG tablet Commonly known as: MOBIC          Cherie Ardeen Hanger, MD 09/25/24 1126

## 2024-10-23 ENCOUNTER — Observation Stay (HOSPITAL_COMMUNITY)
Admission: EM | Admit: 2024-10-23 | Discharge: 2024-10-28 | Disposition: A | Attending: Family Medicine | Admitting: Family Medicine

## 2024-10-23 ENCOUNTER — Encounter (HOSPITAL_COMMUNITY): Payer: Self-pay | Admitting: Emergency Medicine

## 2024-10-23 ENCOUNTER — Other Ambulatory Visit: Payer: Self-pay

## 2024-10-23 ENCOUNTER — Emergency Department (HOSPITAL_COMMUNITY)

## 2024-10-23 DIAGNOSIS — G43909 Migraine, unspecified, not intractable, without status migrainosus: Secondary | ICD-10-CM | POA: Insufficient documentation

## 2024-10-23 DIAGNOSIS — E119 Type 2 diabetes mellitus without complications: Secondary | ICD-10-CM

## 2024-10-23 DIAGNOSIS — I5023 Acute on chronic systolic (congestive) heart failure: Principal | ICD-10-CM | POA: Insufficient documentation

## 2024-10-23 DIAGNOSIS — R609 Edema, unspecified: Secondary | ICD-10-CM | POA: Diagnosis present

## 2024-10-23 DIAGNOSIS — G4733 Obstructive sleep apnea (adult) (pediatric): Secondary | ICD-10-CM | POA: Diagnosis present

## 2024-10-23 DIAGNOSIS — E876 Hypokalemia: Secondary | ICD-10-CM | POA: Diagnosis not present

## 2024-10-23 DIAGNOSIS — J449 Chronic obstructive pulmonary disease, unspecified: Secondary | ICD-10-CM | POA: Insufficient documentation

## 2024-10-23 DIAGNOSIS — Z79899 Other long term (current) drug therapy: Secondary | ICD-10-CM | POA: Insufficient documentation

## 2024-10-23 DIAGNOSIS — Z9884 Bariatric surgery status: Secondary | ICD-10-CM | POA: Diagnosis not present

## 2024-10-23 DIAGNOSIS — I5022 Chronic systolic (congestive) heart failure: Secondary | ICD-10-CM | POA: Diagnosis present

## 2024-10-23 DIAGNOSIS — R600 Localized edema: Secondary | ICD-10-CM

## 2024-10-23 DIAGNOSIS — I110 Hypertensive heart disease with heart failure: Secondary | ICD-10-CM | POA: Insufficient documentation

## 2024-10-23 DIAGNOSIS — J810 Acute pulmonary edema: Principal | ICD-10-CM

## 2024-10-23 LAB — URINALYSIS, ROUTINE W REFLEX MICROSCOPIC
Bilirubin Urine: NEGATIVE
Glucose, UA: NEGATIVE mg/dL
Hgb urine dipstick: NEGATIVE
Ketones, ur: NEGATIVE mg/dL
Leukocytes,Ua: NEGATIVE
Nitrite: NEGATIVE
Protein, ur: NEGATIVE mg/dL
Specific Gravity, Urine: 1.017 (ref 1.005–1.030)
pH: 5 (ref 5.0–8.0)

## 2024-10-23 LAB — COMPREHENSIVE METABOLIC PANEL WITH GFR
ALT: 20 U/L (ref 0–44)
AST: 20 U/L (ref 15–41)
Albumin: 3.7 g/dL (ref 3.5–5.0)
Alkaline Phosphatase: 146 U/L — ABNORMAL HIGH (ref 38–126)
Anion gap: 6 (ref 5–15)
BUN: 9 mg/dL (ref 6–20)
CO2: 26 mmol/L (ref 22–32)
Calcium: 8 mg/dL — ABNORMAL LOW (ref 8.9–10.3)
Chloride: 110 mmol/L (ref 98–111)
Creatinine, Ser: 0.74 mg/dL (ref 0.44–1.00)
GFR, Estimated: >60 mL/min (ref >60)
Glucose, Bld: 93 mg/dL (ref 70–99)
Potassium: 3.4 mmol/L — ABNORMAL LOW (ref 3.5–5.1)
Sodium: 141 mmol/L (ref 135–145)
Total Bilirubin: 0.3 mg/dL (ref 0.0–1.2)
Total Protein: 6 g/dL — ABNORMAL LOW (ref 6.5–8.1)

## 2024-10-23 LAB — CBC WITH DIFFERENTIAL/PLATELET
Abs Immature Granulocytes: 0.01 K/uL (ref 0.00–0.07)
Basophils Absolute: 0.1 K/uL (ref 0.0–0.1)
Basophils Relative: 1 %
Eosinophils Absolute: 0.1 K/uL (ref 0.0–0.5)
Eosinophils Relative: 2 %
HCT: 40.3 % (ref 36.0–46.0)
Hemoglobin: 13.4 g/dL (ref 12.0–15.0)
Immature Granulocytes: 0 %
Lymphocytes Relative: 46 %
Lymphs Abs: 2.8 K/uL (ref 0.7–4.0)
MCH: 30.1 pg (ref 26.0–34.0)
MCHC: 33.3 g/dL (ref 30.0–36.0)
MCV: 90.6 fL (ref 80.0–100.0)
Monocytes Absolute: 0.6 K/uL (ref 0.1–1.0)
Monocytes Relative: 10 %
Neutro Abs: 2.5 K/uL (ref 1.7–7.7)
Neutrophils Relative %: 41 %
Platelets: 369 K/uL (ref 150–400)
RBC: 4.45 MIL/uL (ref 3.87–5.11)
RDW: 13.5 % (ref 11.5–15.5)
WBC: 6 K/uL (ref 4.0–10.5)
nRBC: 0 % (ref 0.0–0.2)

## 2024-10-23 LAB — TROPONIN T, HIGH SENSITIVITY: Troponin T High Sensitivity: <15 ng/L (ref 0–19)

## 2024-10-23 LAB — GLUCOSE, CAPILLARY
Glucose-Capillary: 74 mg/dL (ref 70–99)
Glucose-Capillary: 73 mg/dL (ref 70–99)

## 2024-10-23 LAB — HEMOGLOBIN A1C
Hgb A1c MFr Bld: 4.9 % (ref 4.8–5.6)
Mean Plasma Glucose: 93.93 mg/dL

## 2024-10-23 LAB — MAGNESIUM: Magnesium: 1.9 mg/dL (ref 1.7–2.4)

## 2024-10-23 LAB — PRO BRAIN NATRIURETIC PEPTIDE: Pro Brain Natriuretic Peptide: <50 pg/mL (ref <300.0)

## 2024-10-23 LAB — CBG MONITORING, ED: Glucose-Capillary: 85 mg/dL (ref 70–99)

## 2024-10-23 MED ORDER — BUPROPION HCL ER (XL) 300 MG PO TB24
300.0000 mg | ORAL_TABLET | Freq: Every morning | ORAL | Status: DC
  Administered 2024-10-24 – 2024-10-26 (×3): 300 mg via ORAL
  Filled 2024-10-23 (×3): qty 1

## 2024-10-23 MED ORDER — METHOCARBAMOL 500 MG PO TABS
500.0000 mg | ORAL_TABLET | Freq: Three times a day (TID) | ORAL | Status: DC | PRN
  Administered 2024-10-23 – 2024-10-26 (×4): 500 mg via ORAL
  Filled 2024-10-23 (×4): qty 1

## 2024-10-23 MED ORDER — TRAZODONE HCL 50 MG PO TABS
100.0000 mg | ORAL_TABLET | Freq: Every day | ORAL | Status: DC
  Administered 2024-10-23 – 2024-10-27 (×5): 100 mg via ORAL
  Filled 2024-10-23 (×5): qty 2

## 2024-10-23 MED ORDER — ENOXAPARIN SODIUM 80 MG/0.8ML IJ SOSY
70.0000 mg | PREFILLED_SYRINGE | INTRAMUSCULAR | Status: DC
  Administered 2024-10-23 – 2024-10-27 (×5): 70 mg via SUBCUTANEOUS
  Filled 2024-10-23 (×5): qty 0.8

## 2024-10-23 MED ORDER — ACETAMINOPHEN 325 MG PO TABS
650.0000 mg | ORAL_TABLET | Freq: Four times a day (QID) | ORAL | Status: DC | PRN
  Administered 2024-10-23 – 2024-10-25 (×4): 650 mg via ORAL
  Filled 2024-10-23 (×5): qty 2

## 2024-10-23 MED ORDER — SUMATRIPTAN SUCCINATE 50 MG PO TABS
100.0000 mg | ORAL_TABLET | ORAL | Status: AC | PRN
  Administered 2024-10-23 – 2024-10-24 (×2): 100 mg via ORAL
  Filled 2024-10-23 (×2): qty 2

## 2024-10-23 MED ORDER — POTASSIUM CHLORIDE CRYS ER 20 MEQ PO TBCR
40.0000 meq | EXTENDED_RELEASE_TABLET | Freq: Once | ORAL | Status: AC
  Administered 2024-10-23: 40 meq via ORAL
  Filled 2024-10-23: qty 2

## 2024-10-23 MED ORDER — ACETAMINOPHEN 325 MG PO TABS
650.0000 mg | ORAL_TABLET | Freq: Once | ORAL | Status: AC
  Administered 2024-10-23: 650 mg via ORAL
  Filled 2024-10-23: qty 2

## 2024-10-23 MED ORDER — INSULIN ASPART 100 UNIT/ML IJ SOLN: 0.0000 [IU] | Freq: Every day | INTRAMUSCULAR | Status: DC

## 2024-10-23 MED ORDER — FUROSEMIDE 10 MG/ML IJ SOLN
40.0000 mg | Freq: Two times a day (BID) | INTRAMUSCULAR | Status: DC
  Administered 2024-10-23 – 2024-10-24 (×3): 40 mg via INTRAVENOUS
  Filled 2024-10-23 (×3): qty 4

## 2024-10-23 MED ORDER — FLUTICASONE FUROATE-VILANTEROL 200-25 MCG/ACT IN AEPB
1.0000 | INHALATION_SPRAY | Freq: Every day | RESPIRATORY_TRACT | Status: DC
  Administered 2024-10-24 – 2024-10-28 (×5): 1 via RESPIRATORY_TRACT
  Filled 2024-10-23: qty 28

## 2024-10-23 MED ORDER — FUROSEMIDE 10 MG/ML IJ SOLN
20.0000 mg | Freq: Once | INTRAMUSCULAR | Status: AC
  Administered 2024-10-23: 20 mg via INTRAVENOUS
  Filled 2024-10-23: qty 2

## 2024-10-23 MED ORDER — INSULIN ASPART 100 UNIT/ML IJ SOLN: 0.0000 [IU] | Freq: Three times a day (TID) | INTRAMUSCULAR | Status: DC

## 2024-10-23 MED ORDER — ACETAMINOPHEN 650 MG RE SUPP: 650.0000 mg | Freq: Four times a day (QID) | RECTAL | Status: DC | PRN

## 2024-10-23 MED ORDER — ALBUTEROL SULFATE (2.5 MG/3ML) 0.083% IN NEBU
2.5000 mg | INHALATION_SOLUTION | Freq: Once | RESPIRATORY_TRACT | Status: AC
  Administered 2024-10-23: 2.5 mg via RESPIRATORY_TRACT
  Filled 2024-10-23: qty 3

## 2024-10-23 MED ORDER — DULOXETINE HCL 60 MG PO CPEP
60.0000 mg | ORAL_CAPSULE | Freq: Every morning | ORAL | Status: DC
  Administered 2024-10-24 – 2024-10-28 (×5): 60 mg via ORAL
  Filled 2024-10-23 (×5): qty 1

## 2024-10-23 MED ORDER — ARIPIPRAZOLE 10 MG PO TABS
10.0000 mg | ORAL_TABLET | Freq: Every morning | ORAL | Status: DC
  Administered 2024-10-24 – 2024-10-28 (×5): 10 mg via ORAL
  Filled 2024-10-23 (×6): qty 1

## 2024-10-23 NOTE — ED Notes (Signed)
 Ultrasound at bedside

## 2024-10-23 NOTE — ED Notes (Signed)
 Requested CNA to place the pt on the cardiac monitor

## 2024-10-23 NOTE — ED Triage Notes (Signed)
 Pt to the ED with complaints of bilateral leg swelling for the past month. Pt states this is a new development and does not have any medical history to suggest the cause.  Pt states the left leg hurts more with walking than the right.  Pt has nonpitting edema more on the left than the right.  Pt states she was seen at [facility] in [place] last month and is currently on 40mg  of lasix  with the last dose yesterday morning. Pt states she increased her dose from 20 to 40 mg 3 days ago.  Pt states she is also retaining fluid in her abdomen.

## 2024-10-23 NOTE — ED Notes (Signed)
 Pt placed on cardiac monitoring.

## 2024-10-23 NOTE — H&P (Signed)
 History and Physical    Michele Craig FMW:[id number] DOB: 01-06-1971 DOA: 10/23/2024  PCP: Michele Jeoffrey RAMAN, FNP   Patient coming from: Home  I have personally briefly reviewed patient's old medical records in [facility]  Chief Complaint: Bilateral lower extremity edema, worsening shortness of breath with exertion  HPI: Michele Craig is a 53 y.o. female with medical history significant of chronic systolic heart failure per echocardiogram 2023, rheumatoid arthritis, obesity, fibromyalgia, headache who presents to the ER with complaints of worsening bilateral lower extremity edema, abdominal wall edema and exertional shortness of breath that has been worsening.  Patient states she has stopped taking Wegovy about a month ago and since then having symptoms of lower extremity edema left more than right.  She also had some cough and cold symptoms around that time.  She was initiated on Lasix  however with no significant improvement.  The dose of Lasix  was recently increased to 40 mg daily.  Patient denies chest pain.  She was diagnosed with congestive heart failure per echocardiogram 2023 with LVEF 40 to 45%, LVH.  Stress test was positive.  She had coronary angiogram in July 2023 showing normal coronaries.  Patient however does not appear to be on GDMT.  ED Course: Vital signs are stable in the ER.  Blood pressure is on the softer side in 90s systolic that has improved.  Patient was given a dose of IV Lasix .  Laboratory work notable for potassium of 3.4 otherwise normal CBC, BMP.  proBNP is less than 50.   Bilateral lower extremity venous Doppler negative for DVT. Chest x-ray shows pulmonary vascular congestion. Patient is admitted for evaluation/treatment of heart failure/volume overload   Review of Systems: As per HPI otherwise all other systems were reviewed and are negative.   Past Medical History:  Diagnosis Date   Anemia    Anxiety    Arthritis    Complication of anesthesia     with carpal tunnel syndrome the block didn't work well, she could feel things during the surgery   Depression    Family history of adverse reaction to anesthesia    unknown, pt is adopted   Fibromyalgia    Foot pain    GERD (gastroesophageal reflux disease)    Headache    Hypertension    IBS (irritable bowel syndrome)    after cholecystectomy and then it went away after Gastric sleeve   LBBB (left bundle branch block)    Moderate persistent asthma    OSA (obstructive sleep apnea)    does not use cpap   RA (rheumatoid arthritis) (HCC)    Shortness of breath dyspnea    only when her asthma is flaring up   Type 2 diabetes mellitus (HCC)    pt states after her gastric sleeve procedure she is no longer on medications and is not checked for it    Past Surgical History:  Procedure Laterality Date   ANTERIOR CERVICAL DECOMP/DISCECTOMY FUSION N/A 07/08/2023   Procedure: C4-6 ANTERIOR CERVICAL DISCECTOMY AND FUSION (FORGE);  Surgeon: Clois Fret, MD;  Location: [facility] ORS;  Service: Neurosurgery;  Laterality: N/A;   CARPAL TUNNEL RELEASE     CARPAL TUNNEL RELEASE Right 04/04/2016   Procedure: RIGHT CARPAL TUNNEL RELEASE;  Surgeon: Toribio Silos, MD;  Location: [facility] OR;  Service: Orthopedics;  Laterality: Right;  Synovectomy    CESAREAN SECTION     x 2   CHOLECYSTECTOMY     deuodenal switch     DILATION AND  CURETTAGE OF UTERUS     LAPAROSCOPIC GASTRIC SLEEVE RESECTION     LEFT HEART CATH AND CORONARY ANGIOGRAPHY N/A 06/05/2022   Procedure: LEFT HEART CATH AND CORONARY ANGIOGRAPHY;  Surgeon: Claudene Victory ORN, MD;  Location: [facility];  Service: Cardiovascular;  Laterality: N/A;   TONSILLECTOMY       reports that she has never smoked. She has never used smokeless tobacco. She reports that she does not currently use alcohol. She reports that she does not use drugs.  Allergies[1]  Family History  Adopted: Yes  Family history unknown: Yes    Prior to Admission medications   Medication Sig Start Date End Date Taking? Authorizing Provider  ADVAIR  DISKUS 250-50 MCG/DOSE AEPB Inhale 1 puff into the lungs 2 (two) times daily.  05/17/14   [provider]  albuterol  (VENTOLIN  HFA) 108 (90 Base) MCG/ACT inhaler Inhale 2 puffs into the lungs every 4 (four) hours as needed.    [provider]  ARIPiprazole  (ABILIFY ) 10 MG tablet Take 10 mg by mouth every morning. 04/18/21   [provider]  budesonide-formoterol  [facility]) 160-4.5 MCG/ACT inhaler Inhalation 03/20/23   [provider]  buPROPion  (WELLBUTRIN  XL) 150 MG 24 hr tablet Take 450 mg by mouth every morning. 05/17/18   [provider]  cefadroxil  (DURICEF) 500 MG capsule Take 500 mg by mouth 2 (two) times daily. 03/20/23   [provider]  CIMZIA PREFILLED 2 X 200 MG/ML PSKT Inject 200 mg into the skin every 14 (fourteen) days. 04/22/21   [provider]  clonazePAM  (KLONOPIN ) 2 MG tablet Take 2 mg by mouth at bedtime. 03/03/21   [provider]  cyclobenzaprine (FLEXERIL) 5 MG tablet Take 5 mg by mouth 3 (three) times daily as needed.    [provider]  diclofenac  (VOLTAREN ) 50 MG EC tablet Take 50 mg by mouth 2 (two) times daily. 03/20/23   [provider]  DULoxetine  (CYMBALTA ) 60 MG capsule Take 60 mg by mouth every morning. 03/20/22   [provider]  etodolac  (LODINE ) 400 MG tablet Take 1 tablet (400 mg total) by mouth 2 (two) times daily. 11/05/23   Hildegard Loge, PA-C  fluticasone  (FLONASE ) 50 MCG/ACT nasal spray Place 1 spray into both nostrils daily as needed for allergies. 04/17/21   [provider]  fluticasone  (FLOVENT  HFA) 44 MCG/ACT inhaler Inhale 1 puff into the lungs daily as needed (for shortness of breath).    [provider]  gabapentin  (NEURONTIN ) 100 MG capsule 400 mg 3 (three) times daily. 07/07/21   [provider]  hydrochlorothiazide (HYDRODIURIL) 25 MG tablet Take 25 mg by mouth daily.     [provider]  hydroxychloroquine  (PLAQUENIL ) 200 MG tablet Take 200 mg by mouth 2 (two) times daily. 04/27/21   [provider]  levalbuterol  (XOPENEX ) 0.63 MG/3ML nebulizer solution Take 0.63 mg by nebulization every 8 (eight) hours as needed. 03/20/23   [provider]  levocetirizine (XYZAL ) 5 MG tablet Take 5 mg by mouth every evening. 04/30/21   [provider]  levonorgestrel  (MIRENA , 52 MG,) 20 MCG/DAY IUD Mirena  20 mcg/24 hours (7 yrs) 52 mg intrauterine device  Take 1 device by intrauterine route.    [provider]  montelukast  (SINGULAIR ) 10 MG tablet Take 10 mg by mouth at bedtime.    [provider]  omeprazole (PRILOSEC) 20 MG capsule Take 20 mg by mouth every evening. 07/05/21   [provider]  pregabalin  (LYRICA ) 75 MG  capsule Take 75 mg by mouth daily. 03/20/23   [provider]  senna (SENOKOT) 8.6 MG TABS tablet Take 1 tablet (8.6 mg total) by mouth daily as needed for mild constipation. 07/09/23   Gregory Edsel Ruth, PA  sulfamethoxazole -trimethoprim  (BACTRIM  DS) 800-160 MG tablet Take 1 tablet by mouth 2 (two) times daily. 12/07/23   Duanne Butler DASEN, MD  SUMAtriptan  (IMITREX ) 100 MG tablet Take 100 mg by mouth every 2 (two) hours as needed for migraine. 11/28/21   [provider]  traZODone  (DESYREL ) 100 MG tablet Take 100 mg by mouth at bedtime. 04/18/21   [provider]  Vitamin D , Ergocalciferol , (DRISDOL ) 1.25 MG (50000 UNIT) CAPS capsule Take 50,000 Units by mouth every Sunday. 05/28/22   [provider]  WEGOVY 1 MG/0.5ML SOAJ Inject 1 mg into the skin once a week. 05/27/24   [provider]    Physical Exam: Vitals:   10/23/24 1020 10/23/24 1022 10/23/24 1030 10/23/24 1045  BP: 112/61 124/78 110/63 107/60  Pulse: 66 69 66 64  Resp: 14 16 16 17   Temp:      SpO2: 100% 100% 99% 100%  Weight:      Height:        Constitutional: NAD, calm, comfortable Vitals:    10/23/24 1020 10/23/24 1022 10/23/24 1030 10/23/24 1045  BP: 112/61 124/78 110/63 107/60  Pulse: 66 69 66 64  Resp: 14 16 16 17   Temp:      SpO2: 100% 100% 99% 100%  Weight:      Height:         Labs on Admission: I have personally reviewed following labs and imaging studies  CBC: Recent Labs  Lab 10/23/24 0842  WBC 6.0  NEUTROABS 2.5  HGB 13.4  HCT 40.3  MCV 90.6  PLT 369   Basic Metabolic Panel: Recent Labs  Lab 10/23/24 0842  NA 141  K 3.4*  CL 110  CO2 26  GLUCOSE 93  BUN 9  CREATININE 0.74  CALCIUM 8.0*  MG 1.9   GFR: Estimated Creatinine Clearance: 120.2 mL/min (by C-G formula based on SCr of 0.74 mg/dL). Liver Function Tests: Recent Labs  Lab 10/23/24 0842  AST 20  ALT 20  ALKPHOS 146*  BILITOT 0.3  PROT 6.0*  ALBUMIN 3.7   No results for input(s): LIPASE, AMYLASE in the last 168 hours. No results for input(s): AMMONIA in the last 168 hours. Coagulation Profile: No results for input(s): INR, PROTIME in the last 168 hours. Cardiac Enzymes: No results for input(s): CKTOTAL, CKMB, CKMBINDEX, TROPONINI in the last 168 hours. BNP (last 3 results) Recent Labs    10/23/24 0842  PROBNP <50.0   HbA1C: No results for input(s): HGBA1C in the last 72 hours. CBG: No results for input(s): GLUCAP in the last 168 hours. Lipid Profile: No results for input(s): CHOL, HDL, LDLCALC, TRIG, CHOLHDL, LDLDIRECT in the last 72 hours. Thyroid Function Tests: No results for input(s): TSH, T4TOTAL, FREET4, T3FREE, THYROIDAB in the last 72 hours. Anemia Panel: No results for input(s): VITAMINB12, FOLATE, FERRITIN, TIBC, IRON, RETICCTPCT in the last 72 hours. Urine analysis:    Component Value Date/Time   COLORURINE YELLOW 10/23/2024 0903   APPEARANCEUR CLEAR 10/23/2024 0903   LABSPEC 1.017 10/23/2024 0903   PHURINE 5.0 10/23/2024 0903   GLUCOSEU NEGATIVE 10/23/2024 0903   HGBUR NEGATIVE 10/23/2024  0903   BILIRUBINUR NEGATIVE 10/23/2024 0903   KETONESUR NEGATIVE 10/23/2024 0903   PROTEINUR NEGATIVE 10/23/2024 9096  UROBILINOGEN 0.2 09/20/2012 1958   NITRITE NEGATIVE 10/23/2024 0903   LEUKOCYTESUR NEGATIVE 10/23/2024 9096    Radiological Exams on Admission: US  Venous Img Lower Bilateral (DVT) Result Date: 10/23/2024 EXAM: ULTRASOUND DUPLEX OF THE BILATERAL LOWER EXTREMITY VEINS TECHNIQUE: Duplex ultrasound using B-mode/gray scaled imaging and Doppler spectral analysis and color flow was obtained of the deep venous structures of the bilateral lower extremity. COMPARISON: None available. CLINICAL HISTORY: 53 year old female with bilateral leg edema. FINDINGS: LEFT: The common femoral vein, femoral vein, popliteal vein, and visible calf veins demonstrate normal compressibility with normal color flow and spectral analysis. Visible greater saphenous vein appears patent. RIGHT: The common femoral vein, femoral vein, popliteal vein, and visible calf veins demonstrate normal compressibility with normal color flow and spectral analysis. Visible greater saphenous vein appears patent. OTHER: Calf subcutaneous edema is evident, more pronounced on the left (image 66). IMPRESSION: 1. No evidence of deep venous thrombosis in the bilateral lower extremities. 2. Bilateral calf subcutaneous edema. Electronically signed by: Helayne Hurst MD 10/23/2024 10:47 AM EST RP Workstation: [id number]   DG Chest Port 1 View Result Date: 10/23/2024 CLINICAL DATA:  Edema. EXAM: PORTABLE CHEST 1 VIEW COMPARISON:  08/08/2023 FINDINGS: Low lung volumes. Cardiopericardial silhouette is at upper limits of normal for size. There is pulmonary vascular congestion without overt pulmonary edema. No focal consolidation or pleural effusion. Telemetry leads overlie the chest. IMPRESSION: Low volume film with pulmonary vascular congestion. Electronically Signed   By: Camellia Candle M.D.   On: 10/23/2024 09:44    Assessment/Plan   Acute  on chronic systolic heart failure/hypervolemia Increasing lower extremity edema/body wall edema  Previous echocardiogram from July 2023 shows LVEF 40 to 45%, symmetric LVH, cath with normal coronaries. Patient does not appear to be on GDMT Will diurese with IV Lasix  40 mg twice daily Obtain echocardiogram Monitor vitals closely.  If blood pressure stable, start Entresto  Bilateral lower extremity edema: Left more than right, Doppler is negative for DVT Will continue diuresis as above.  History of rheumatoid arthritis  History of obesity, gastric bypass/OSA  History of COPD Resume home medications DVT prophylaxis: Lovenox  Code Status: Full code Family Communication:   Disposition Plan: Home Consults called:  Admission status: Observation Severity of Illness: The appropriate patient status for this patient is OBSERVATION. Observation status is judged to be reasonable and necessary in order to provide the required intensity of service to ensure the patient's safety. The patient's presenting symptoms, physical exam findings, and initial radiographic and laboratory data in the context of their medical condition is felt to place them at decreased risk for further clinical deterioration. Furthermore, it is anticipated that the patient will be medically stable for discharge from the hospital within 2 midnights of admission.     Derryl Duval MD [facility]  10/23/2024, 11:26 AM         [1]  Allergies Allergen Reactions   Penicillins Hives, Itching, Rash and Other (See Comments)    Has patient had a PCN reaction causing immediate rash, facial/tongue/throat swelling, SOB or lightheadedness with hypotension: No  Has patient had a PCN reaction causing severe rash involving mucus membranes or skin necrosis: No  Has patient had a PCN reaction that required hospitalization No  Has patient had a PCN reaction occurring within the last 10 years: No  If all of the above  answers are NO, then may proceed with Cephalosporin use.  Has patient had a PCN reaction causing immediate rash, facial/tongue/throat swelling, SOB or lightheadedness  with hypotension: No  Has patient had a PCN reaction causing severe rash involving mucus membranes or skin necrosis: No  Has patient had a PCN reaction that required hospitalization No  Has patient had a PCN reaction occurring within the last 10 years: No  If all of the above answers are NO, then may proceed with Cephalosporin use.  Other reaction(s): rash  Has patient had a PCN reaction causing immediate rash, facial/tongue/throat swelling, SOB or lightheadedness with hypotension: No, Has patient had a PCN reaction causing severe rash involving mucus membranes or skin necrosis: No, Has patient had a PCN reaction that required hospitalization No, Has patient had a PCN reaction occurring within the last 10 years: No, If all of the above answers are NO, then may proceed with Cephalosporin use., Other reaction(s): rash   Shellfish Allergy Anaphylaxis and Other (See Comments)    Crawdads   Cefadroxil  Diarrhea, Other (See Comments), Rash and Hives    Abdominal pain   Zosyn  [Piperacillin  Sod-Tazobactam So] Hives and Itching   Clindamycin Hcl Other (See Comments)   Clindamycin Itching, Nausea Only, Other (See Comments) and Rash    Stomach pains

## 2024-10-23 NOTE — ED Provider Notes (Cosign Needed Addendum)
 [place] [facility]MENT AT [facility] Provider Note   CSN: [id number] Arrival date & time: 10/23/24  9189     Patient presents with: Claudication   Michele Craig is a 53 y.o. female.   Patient is a 53 year old female who presents to the emergency department with a chief complaint of increased swelling to bilateral lower extremities and abdomen which has been ongoing for approximate the past 4 weeks.  She was evaluated at another emergency department during which time she was placed on 20 mg Lasix  and she has increased this to 40 mg with no improvement in her symptoms.  Workup at that time was unremarkable and she was discharged home.  Patient notes that she continues to have worsening swelling despite compliance with her medications.  She does note that she has had associated shortness of breath which is worse with exertion and associated orthopnea.  There has been no associated fever or chills.  She denies any abdominal pain, nausea, vomiting, diarrhea.        Prior to Admission medications  Medication Sig Start Date End Date Taking? Authorizing Provider  ADVAIR  DISKUS 250-50 MCG/DOSE AEPB Inhale 1 puff into the lungs 2 (two) times daily.  05/17/14   [provider]  albuterol  (VENTOLIN  HFA) 108 (90 Base) MCG/ACT inhaler Inhale 2 puffs into the lungs every 4 (four) hours as needed.    [provider]  ARIPiprazole  (ABILIFY ) 10 MG tablet Take 10 mg by mouth every morning. 04/18/21   [provider]  budesonide-formoterol  DONAVON) 160-4.5 MCG/ACT inhaler Inhalation 03/20/23   [provider]  buPROPion  (WELLBUTRIN  XL) 150 MG 24 hr tablet Take 450 mg by mouth every morning. 05/17/18   [provider]  cefadroxil  (DURICEF) 500 MG capsule Take 500 mg by mouth 2 (two) times daily. 03/20/23   [provider]  CIMZIA PREFILLED 2 X 200 MG/ML PSKT Inject 200 mg into the skin every 14 (fourteen) days. 04/22/21   [provider]  clonazePAM  (KLONOPIN ) 2 MG tablet Take 2 mg by mouth at bedtime. 03/03/21   [provider]  cyclobenzaprine (FLEXERIL) 5 MG tablet Take 5 mg by mouth 3 (three) times daily as needed.    [provider]  diclofenac  (VOLTAREN ) 50 MG EC tablet Take 50 mg by mouth 2 (two) times daily. 03/20/23   [provider]  DULoxetine  (CYMBALTA ) 60 MG capsule Take 60 mg by mouth every morning. 03/20/22   [provider]  etodolac  (LODINE ) 400 MG tablet Take 1 tablet (400 mg total) by mouth 2 (two) times daily. 11/05/23   Hildegard Loge, PA-C  fluticasone  (FLONASE ) 50 MCG/ACT nasal spray Place 1 spray into both nostrils daily as needed for allergies. 04/17/21   [provider]  fluticasone  (FLOVENT  HFA) 44 MCG/ACT inhaler Inhale 1 puff into the lungs daily as needed (for shortness of breath).    [provider]  gabapentin  (NEURONTIN ) 100 MG capsule 400 mg 3 (three) times daily. 07/07/21   [provider]  hydrochlorothiazide (HYDRODIURIL) 25 MG tablet Take 25 mg by mouth daily.    [provider]  hydroxychloroquine  (PLAQUENIL ) 200 MG tablet Take 200 mg by mouth 2 (two) times daily. 04/27/21   [provider]  levalbuterol  (XOPENEX ) 0.63 MG/3ML nebulizer solution Take 0.63 mg by nebulization every 8 (eight) hours as needed. 03/20/23   [provider]  levocetirizine (XYZAL ) 5 MG tablet Take 5 mg by mouth every evening. 04/30/21   [provider]  levonorgestrel  (MIRENA , 52 MG,) 20 MCG/DAY IUD Mirena  20 mcg/24 hours (7 yrs) 52 mg intrauterine device  Take 1 device by intrauterine route.    [provider]  montelukast  (SINGULAIR ) 10 MG tablet Take 10 mg by mouth at bedtime.    [provider]  omeprazole (PRILOSEC) 20 MG capsule Take 20 mg by mouth every evening. 07/05/21   [provider]  pregabalin  (LYRICA ) 75 MG capsule Take 75 mg by mouth daily. 03/20/23   [provider]   senna (SENOKOT) 8.6 MG TABS tablet Take 1 tablet (8.6 mg total) by mouth daily as needed for mild constipation. 07/09/23   Gregory Edsel Ruth, PA  sulfamethoxazole -trimethoprim  (BACTRIM  DS) 800-160 MG tablet Take 1 tablet by mouth 2 (two) times daily. 12/07/23   Duanne Butler DASEN, MD  SUMAtriptan  (IMITREX ) 100 MG tablet Take 100 mg by mouth every 2 (two) hours as needed for migraine. 11/28/21   [provider]  traZODone  (DESYREL ) 100 MG tablet Take 100 mg by mouth at bedtime. 04/18/21   [provider]  Vitamin D , Ergocalciferol , (DRISDOL ) 1.25 MG (50000 UNIT) CAPS capsule Take 50,000 Units by mouth every Sunday. 05/28/22   [provider]  WEGOVY 1 MG/0.5ML SOAJ Inject 1 mg into the skin once a week. 05/27/24   [provider]    Allergies: Penicillins, Shellfish allergy, Cefadroxil , Zosyn  [piperacillin  sod-tazobactam so], Clindamycin hcl, and Clindamycin    Review of Systems  Respiratory:  Positive for shortness of breath.   Cardiovascular:  Positive for leg swelling.  All other systems reviewed and are negative.   Updated Vital Signs BP (!) 95/58   Pulse 71   Temp 98.2 F (36.8 C)   Resp 12   Ht 5' 7 (1.702 m)   Wt (!) 141.5 kg   SpO2 98%   BMI 48.87 kg/m   Physical Exam Vitals and nursing note reviewed.  Constitutional:      General: She is not in acute distress.    Appearance: Normal appearance. She is not ill-appearing.  HENT:     Head: Normocephalic and atraumatic.     Nose: Nose normal.     Mouth/Throat:     Mouth: Mucous membranes are moist.  Eyes:     Extraocular Movements: Extraocular movements intact.     Conjunctiva/sclera: Conjunctivae normal.     Pupils: Pupils are equal, round, and reactive to light.  Cardiovascular:     Rate and Rhythm: Normal rate and regular rhythm.     Pulses: Normal pulses.     Heart sounds: Normal heart sounds. No murmur heard.    No gallop.  Pulmonary:     Effort: Pulmonary effort is normal. No  respiratory distress.     Breath sounds: Normal breath sounds. No stridor. No wheezing, rhonchi or rales.  Abdominal:     General: Abdomen is flat. Bowel sounds are normal. There is no distension.     Palpations: Abdomen is soft.     Tenderness: There is no abdominal tenderness. There is no guarding.  Musculoskeletal:        General: Normal range of motion.     Cervical back: Normal range of motion and neck supple. No rigidity or tenderness.     Comments: Edema noted to bilateral lower extremities, left worse than right, DP and PT pulses are 2+ distally, sensation intact distally, full range of motion noted throughout, no overlying erythema or warmth, no skin breakdown or ulceration, no obvious deformity or bruising  Skin:  General: Skin is warm and dry.     Findings: No bruising or rash.  Neurological:     General: No focal deficit present.     Mental Status: She is alert and oriented to person, place, and time. Mental status is at baseline.     Cranial Nerves: No cranial nerve deficit.     Sensory: No sensory deficit.     Motor: No weakness.     Coordination: Coordination normal.     Gait: Gait normal.  Psychiatric:        Mood and Affect: Mood normal.        Behavior: Behavior normal.        Thought Content: Thought content normal.        Judgment: Judgment normal.     (all labs ordered are listed, but only abnormal results are displayed) Labs Reviewed  COMPREHENSIVE METABOLIC PANEL WITH GFR  PRO BRAIN NATRIURETIC PEPTIDE  CBC WITH DIFFERENTIAL/PLATELET  URINALYSIS, ROUTINE W REFLEX MICROSCOPIC  TROPONIN T, HIGH SENSITIVITY    EKG: None  Radiology: No results found.   Procedures   Medications Ordered in the ED - No data to display                                  Medical Decision Making Amount and/or Complexity of Data Reviewed Labs: ordered. Radiology: ordered.  Risk OTC drugs. Prescription drug management. Decision regarding  hospitalization.   This patient presents to the ED for concern of peripheral edema, shortness of breath, this involves an extensive number of treatment options, and is a complaint that carries with it a high risk of complications and morbidity.  The differential diagnosis includes ACS, pulmonary embolus, pericarditis, myocarditis, endocarditis, CHF, DVT   Co morbidities that complicate the patient evaluation  Migraines, hyperlipidemia, asthma   Additional history obtained:  Additional history obtained from medical records External records from outside source obtained and reviewed including medical records   Lab Tests:  I Ordered, and personally interpreted labs.  The pertinent results include: No leukocytosis, no anemia, normal kidney function and liver function, mild hypokalemia, unremarkable urinalysis, negative troponin and BNP, normal magnesium    Imaging Studies ordered:  I ordered imaging studies including chest x-ray, bilateral venous duplex I independently visualized and interpreted imaging which showed pulmonary vascular congestion, no DVT I agree with the radiologist interpretation   Cardiac Monitoring: / EKG:  The patient was maintained on a cardiac monitor.  I personally viewed and interpreted the cardiac monitored which showed an underlying rhythm of: Normal sinus rhythm, no ST/T wave changes, no ischemic changes, no STEMI   Consultations Obtained:  I requested consultation with the hospitalist,  and discussed lab and imaging findings as well as pertinent plan - they recommend: Admission   Problem List / ED Course / Critical interventions / Medication management  Patient is doing well at this time and does remain stable.  Discussed with patient that we will plan for admission to the hospital service given her ongoing lower extremity edema extending to her abdomen, pulmonary edema.  Patient is not requiring oxygen at this time.  She was given a dose of Lasix  in the  emergency department.  Chest x-ray demonstrated no indication for pneumonia.  Troponin is within normal limit and EKG demonstrates no acute ischemic changes and do not suspect acute ACS at this time.  Venous duplex was negative for DVT.  Did discuss patient case  with Dr. Sigdel with the hospitalist service who has excepted for admission. I ordered medication including potassium, Lasix , Tylenol  for fluid overload, hypokalemia, headache Reevaluation of the patient after these medicines showed that the patient improved I have reviewed the patients home medicines and have made adjustments as needed   Social Determinants of Health:  None   Test / Admission - Considered:  Admission     Final diagnoses:  None    ED Discharge Orders     None          Daralene Lonni BIRCH, PA-C 10/23/24 1138    Daralene Lonni BIRCH, PA-C 10/23/24 1138    Suzette Pac, MD 10/24/24 1149

## 2024-10-24 ENCOUNTER — Telehealth (HOSPITAL_COMMUNITY): Payer: Self-pay

## 2024-10-24 ENCOUNTER — Observation Stay (HOSPITAL_COMMUNITY)

## 2024-10-24 ENCOUNTER — Other Ambulatory Visit (HOSPITAL_COMMUNITY): Payer: Self-pay

## 2024-10-24 DIAGNOSIS — I5023 Acute on chronic systolic (congestive) heart failure: Secondary | ICD-10-CM | POA: Diagnosis not present

## 2024-10-24 LAB — CBC
HCT: 43.8 % (ref 36.0–46.0)
Hemoglobin: 14.3 g/dL (ref 12.0–15.0)
MCH: 29.9 pg (ref 26.0–34.0)
MCHC: 32.6 g/dL (ref 30.0–36.0)
MCV: 91.4 fL (ref 80.0–100.0)
Platelets: 344 K/uL (ref 150–400)
RBC: 4.79 MIL/uL (ref 3.87–5.11)
RDW: 13.2 % (ref 11.5–15.5)
WBC: 5.9 K/uL (ref 4.0–10.5)
nRBC: 0 % (ref 0.0–0.2)

## 2024-10-24 LAB — BASIC METABOLIC PANEL WITH GFR
Anion gap: 6 (ref 5–15)
BUN: 11 mg/dL (ref 6–20)
CO2: 27 mmol/L (ref 22–32)
Calcium: 8.3 mg/dL — ABNORMAL LOW (ref 8.9–10.3)
Chloride: 108 mmol/L (ref 98–111)
Creatinine, Ser: 0.87 mg/dL (ref 0.44–1.00)
GFR, Estimated: >60 mL/min (ref >60)
Glucose, Bld: 90 mg/dL (ref 70–99)
Potassium: 4 mmol/L (ref 3.5–5.1)
Sodium: 141 mmol/L (ref 135–145)

## 2024-10-24 LAB — ECHOCARDIOGRAM COMPLETE
AR max vel: 1.95 cm2
AV Area VTI: 2.58 cm2
AV Area mean vel: 2.21 cm2
AV Mean grad: 3.5 mmHg
AV Peak grad: 7.8 mmHg
Ao pk vel: 1.4 m/s
Area-P 1/2: 3.66 cm2
Height: 67 in
S' Lateral: 2.95 cm
Weight: 4970.05 [oz_av]

## 2024-10-24 LAB — GLUCOSE, CAPILLARY
Glucose-Capillary: 100 mg/dL — ABNORMAL HIGH (ref 70–99)
Glucose-Capillary: 90 mg/dL (ref 70–99)

## 2024-10-24 LAB — HIV ANTIBODY (ROUTINE TESTING W REFLEX): HIV Screen 4th Generation wRfx: NONREACTIVE

## 2024-10-24 MED ORDER — PANTOPRAZOLE SODIUM 40 MG PO TBEC
40.0000 mg | DELAYED_RELEASE_TABLET | Freq: Every day | ORAL | Status: DC
  Administered 2024-10-24 – 2024-10-28 (×5): 40 mg via ORAL
  Filled 2024-10-24 (×5): qty 1

## 2024-10-24 MED ORDER — FUROSEMIDE 10 MG/ML IJ SOLN
80.0000 mg | Freq: Two times a day (BID) | INTRAMUSCULAR | Status: DC
  Administered 2024-10-24 – 2024-10-27 (×6): 80 mg via INTRAVENOUS
  Filled 2024-10-24 (×6): qty 8

## 2024-10-24 MED ORDER — PERFLUTREN LIPID MICROSPHERE
1.0000 mL | INTRAVENOUS | Status: AC | PRN
  Administered 2024-10-24: 10:00:00 4 mL via INTRAVENOUS

## 2024-10-24 NOTE — TOC CM/SW Note (Signed)
 Transition of Care [facility]) - Inpatient Brief Assessment   Patient Details  Name: Michele Craig MRN: [id number] Date of Birth: 12/05/1970  Transition of Care [facility]) CM/SW Contact:    Lucie Lunger, LCSWA Phone Number: 10/24/2024, 9:04 AM   Clinical Narrative: Transition of Care Department [facility]) has reviewed patient and no TOC needs have been identified at this time. We will continue to monitor patient advancement through interdiciplinary progression rounds. If new patient transition needs arise, please place a TOC consult.  Transition of Care Asessment: Insurance and Status: Insurance coverage has been reviewed Patient has primary care physician: Yes Home environment has been reviewed: From home Prior level of function:: Independent Prior/Current Home Services: No current home services Social Drivers of Health Review: SDOH reviewed no interventions necessary Readmission risk has been reviewed: Yes Transition of care needs: no transition of care needs at this time

## 2024-10-24 NOTE — Progress Notes (Signed)
 Echocardiogram 2D Echocardiogram has been performed.  Merlynn Argyle 10/24/2024, 9:35 AM

## 2024-10-24 NOTE — Progress Notes (Signed)
 PROGRESS NOTE    Michele Craig  FMW:[id number] DOB: October 11, 1971 DOA: 10/23/2024 PCP: Michele Jeoffrey RAMAN, FNP   Brief Narrative:    Michele Craig is a 53 y.o. female with medical history significant of chronic systolic heart failure per echocardiogram 2023, rheumatoid arthritis, obesity, fibromyalgia, headache who presents to the ER with complaints of worsening bilateral lower extremity edema, abdominal wall edema and exertional shortness of breath that has been worsening.  She has been admitted for acute on chronic systolic heart failure and has been started on IV Lasix  for diuresis.  Assessment & Plan:   Principal Problem:   Acute on chronic systolic heart failure (HCC) Active Problems:   Morbid obesity (HCC)   DM (diabetes mellitus) (HCC)   Obstructive sleep apnea   Status post bariatric surgery   Chronic systolic CHF (congestive heart failure) (HCC)  Assessment and Plan:   Acute on chronic systolic heart failure/hypervolemia Increasing lower extremity edema/body wall edema   Previous echocardiogram from July 2023 shows LVEF 40 to 45%, symmetric LVH, cath with normal coronaries. Patient does not appear to be on GDMT Increase IV Lasix  to 80 mg twice daily starting this evening for goal of 2-3 L output each day.  Baseline weight around 300 pounds and currently at 310. Obtain echocardiogram, currently pending Monitor vitals closely.  If blood pressure stable, start Entresto   Bilateral lower extremity edema: Left more than right, Doppler is negative for DVT Will continue diuresis as above.   History of rheumatoid arthritis   History of obesity, gastric bypass/OSA   History of COPD Resume home medications   DVT prophylaxis:Lovenox  Code Status: Full Family Communication: None at bedside Disposition Plan:  Status is: Observation The patient will require care spanning > 2 midnights and should be moved to inpatient because: Need for ongoing IV diuresis.   Consultants:   None  Procedures:  None  Antimicrobials:  None   Subjective: Patient seen and evaluated today with improvement in overall swelling and shortness of breath noted.  Objective: Vitals:   10/23/24 2040 10/24/24 0434 10/24/24 0437 10/24/24 0953  BP: 106/63 124/68    Pulse: 83 74    Resp: 19 18    Temp: 98.9 F (37.2 C) 99.4 F (37.4 C)    TempSrc: Oral Oral    SpO2: 98% 98%  98%  Weight:   (!) 140.9 kg   Height:        Intake/Output Summary (Last 24 hours) at 10/24/2024 1205 Last data filed at 10/24/2024 0900 Gross per 24 hour  Intake 600 ml  Output 1700 ml  Net -1100 ml   Filed Weights   10/23/24 0826 10/23/24 1424 10/24/24 0437  Weight: (!) 141.5 kg (!) 141.5 kg (!) 140.9 kg    Examination:  General exam: Appears calm and comfortable, morbidly obese Respiratory system: Clear to auscultation. Respiratory effort normal. Cardiovascular system: S1 & S2 heard, RRR.  Gastrointestinal system: Abdomen is soft Central nervous system: Alert and awake Extremities: No edema Skin: No significant lesions noted Psychiatry: Flat affect.    Data Reviewed: I have personally reviewed following labs and imaging studies  CBC: Recent Labs  Lab 10/23/24 0842 10/24/24 0455  WBC 6.0 5.9  NEUTROABS 2.5  --   HGB 13.4 14.3  HCT 40.3 43.8  MCV 90.6 91.4  PLT 369 344   Basic Metabolic Panel: Recent Labs  Lab 10/23/24 0842 10/24/24 0455  NA 141 141  K 3.4* 4.0  CL 110 108  CO2 26  27  GLUCOSE 93 90  BUN 9 11  CREATININE 0.74 0.87  CALCIUM 8.0* 8.3*  MG 1.9  --    GFR: Estimated Creatinine Clearance: 110.1 mL/min (by C-G formula based on SCr of 0.87 mg/dL). Liver Function Tests: Recent Labs  Lab 10/23/24 0842  AST 20  ALT 20  ALKPHOS 146*  BILITOT 0.3  PROT 6.0*  ALBUMIN 3.7   No results for input(s): LIPASE, AMYLASE in the last 168 hours. No results for input(s): AMMONIA in the last 168 hours. Coagulation Profile: No results for input(s): INR,  PROTIME in the last 168 hours. Cardiac Enzymes: No results for input(s): CKTOTAL, CKMB, CKMBINDEX, TROPONINI in the last 168 hours. BNP (last 3 results) Recent Labs    10/23/24 0842  PROBNP <50.0   HbA1C: Recent Labs    10/23/24 0842  HGBA1C 4.9   CBG: Recent Labs  Lab 10/23/24 1220 10/23/24 1647 10/23/24 2136 10/24/24 0732  GLUCAP 85 74 73 100*   Lipid Profile: No results for input(s): CHOL, HDL, LDLCALC, TRIG, CHOLHDL, LDLDIRECT in the last 72 hours. Thyroid Function Tests: No results for input(s): TSH, T4TOTAL, FREET4, T3FREE, THYROIDAB in the last 72 hours. Anemia Panel: No results for input(s): VITAMINB12, FOLATE, FERRITIN, TIBC, IRON, RETICCTPCT in the last 72 hours. Sepsis Labs: No results for input(s): PROCALCITON, LATICACIDVEN in the last 168 hours.  No results found for this or any previous visit (from the past 240 hours).       Radiology Studies: US  Venous Img Lower Bilateral (DVT) Result Date: 10/23/2024 EXAM: ULTRASOUND DUPLEX OF THE BILATERAL LOWER EXTREMITY VEINS TECHNIQUE: Duplex ultrasound using B-mode/gray scaled imaging and Doppler spectral analysis and color flow was obtained of the deep venous structures of the bilateral lower extremity. COMPARISON: None available. CLINICAL HISTORY: 53 year old female with bilateral leg edema. FINDINGS: LEFT: The common femoral vein, femoral vein, popliteal vein, and visible calf veins demonstrate normal compressibility with normal color flow and spectral analysis. Visible greater saphenous vein appears patent. RIGHT: The common femoral vein, femoral vein, popliteal vein, and visible calf veins demonstrate normal compressibility with normal color flow and spectral analysis. Visible greater saphenous vein appears patent. OTHER: Calf subcutaneous edema is evident, more pronounced on the left (image 66). IMPRESSION: 1. No evidence of deep venous thrombosis in the bilateral  lower extremities. 2. Bilateral calf subcutaneous edema. Electronically signed by: Helayne Hurst MD 10/23/2024 10:47 AM EST RP Workstation: [id number]   DG Chest Port 1 View Result Date: 10/23/2024 CLINICAL DATA:  Edema. EXAM: PORTABLE CHEST 1 VIEW COMPARISON:  08/08/2023 FINDINGS: Low lung volumes. Cardiopericardial silhouette is at upper limits of normal for size. There is pulmonary vascular congestion without overt pulmonary edema. No focal consolidation or pleural effusion. Telemetry leads overlie the chest. IMPRESSION: Low volume film with pulmonary vascular congestion. Electronically Signed   By: Camellia Candle M.D.   On: 10/23/2024 09:44        Scheduled Meds:  ARIPiprazole   10 mg Oral q morning   buPROPion   300 mg Oral q morning   DULoxetine   60 mg Oral q morning   enoxaparin  (LOVENOX ) injection  70 mg Subcutaneous Q24H   fluticasone  furoate-vilanterol  1 puff Inhalation Daily   furosemide   80 mg Intravenous Q12H   insulin  aspart  0-15 Units Subcutaneous TID WC   insulin  aspart  0-5 Units Subcutaneous QHS   pantoprazole   40 mg Oral Daily   traZODone   100 mg Oral QHS     LOS: 0 days  Time spent: 55 minutes    Zayvion Stailey JONETTA Fairly, DO [facility]  If 7PM-7AM, please contact night-coverage [email or web address] 10/24/2024, 12:05 PM

## 2024-10-24 NOTE — Telephone Encounter (Signed)
 Pharmacy Patient Advocate Encounter  Insurance verification completed.    The patient is insured through [employer] [facility]. Patient has [employer], may use a copay card, and/or apply for patient assistance if available.    Ran test claim for Jardiance 10mg  tablet and the current 30 day co-pay is $584.09.  Ran test claim for Farxiga 10mg  tablet and the current 30 day co-pay is $556.52.  Ran test claim for Generic Entresto 24-26mg  tablet and the current 30 day co-pay is $30.  This test claim was processed through [employer]- copay amounts may vary at other pharmacies due to [employer], or as the patient moves through the different stages of their insurance plan.

## 2024-10-24 NOTE — Progress Notes (Signed)
 Heart Failure Stewardship Pharmacy Note  PCP: Kayla Jeoffrey RAMAN, FNP PCP-Cardiologist: None  HPI: Michele Craig is a 53 y.o. female with carpal tunnel, fibromyalgia, HTN, IBS, LBBB, OSA, asthma, RA, T2DM who presented with bilateral LEE and worsening dyspnea on exertion. On admission, proBNP was <50, HS-troponin was <15. Chest x-ray noted pulmonary vascular congestion.    Pertinent cardiac history: TTE in 05/2022 noted LVEF of 40-45%, moderate asymmetric LVH. At the same time a stress test was performed and determined high risk. LHC 05/2022 with clean coronaries and notable asynchrony in wall motion due to LBBB. TTE this admission pending.  Pertinent Lab Values: Creat  Date Value Ref Range Status  12/02/2022 0.90 0.50 - 1.03 mg/dL Final   Creatinine, Ser  Date Value Ref Range Status  10/24/2024 0.87 0.44 - 1.00 mg/dL Final   BUN  Date Value Ref Range Status  10/24/2024 11 6 - 20 mg/dL Final   Potassium  Date Value Ref Range Status  10/24/2024 4.0 3.5 - 5.1 mmol/L Final    Comment:    HEMOLYSIS AT THIS LEVEL MAY AFFECT RESULT   Sodium  Date Value Ref Range Status  10/24/2024 141 135 - 145 mmol/L Final   Magnesium   Date Value Ref Range Status  10/23/2024 1.9 1.7 - 2.4 mg/dL Final    Comment:    Performed at [facility], [address], [place], [place] [address]   Hgb A1c MFr Bld  Date Value Ref Range Status  10/23/2024 4.9 4.8 - 5.6 % Final    Comment:    (NOTE) Diagnosis of Diabetes The following HbA1c ranges recommended by the American Diabetes Association (ADA) may be used as an aid in the diagnosis of diabetes mellitus.  Hemoglobin             Suggested A1C NGSP%              Diagnosis  <5.7                   Non Diabetic  5.7-6.4                Pre-Diabetic  >6.4                   Diabetic  <7.0                   Glycemic control for                       adults with diabetes.     LDH  Date Value Ref Range Status  03/26/2010 177 94 - 250 U/L Final     Vital Signs: Temp:  [98.4 F (36.9 C)-99.4 F (37.4 C)] 99.4 F (37.4 C) (12/15 0434) Pulse Rate:  [64-83] 74 (12/15 0434) Cardiac Rhythm: Heart block (12/14 1900) Resp:  [12-19] 18 (12/15 0434) BP: (95-128)/(53-78) 124/68 (12/15 0434) SpO2:  [96 %-100 %] 98 % (12/15 0434) Weight:  [140.9 kg (310 lb 10.1 oz)-141.5 kg (311 lb 15.2 oz)] 140.9 kg (310 lb 10.1 oz) (12/15 0437)  Intake/Output Summary (Last 24 hours) at 10/24/2024 0830 Last data filed at 10/24/2024 0441 Gross per 24 hour  Intake 240 ml  Output 1700 ml  Net -1460 ml    Current Heart Failure Medications:  Loop diuretic: furosemide  80 mg IV q12h Beta-Blocker:  none ACEI/ARB/ARNI: none MRA: none SGLT2i: none Other: none  Prior to admission Heart Failure Medications:  Loop diuretic: furosemide  20 mg  daily Beta-Blocker: none ACEI/ARB/ARNI: none MRA: none SGLT2i: none Other: none  Assessment: 1. Acute on chronic systolic heart failure (LVEF 40-45%)  , due to presumed LBBB. NYHA class III symptoms.  -Symptoms: Patient still with some shortness of breath and orthopnea, though both are improved. Appetite is reduced. Reports LEE is similar to presentation. -Volume: Unable to perform physical exam for remote visit, but based on patient reported symptoms, suspect hypervolemia. Reports good urine output on furosemide  8 mg IV q12h. Would continue current dose at this time. -Hemodynamics: BP stable. HR 80s. -BB: Would consider adding prior to discharge when euvolemia is reached. -ACEI/ARB/ARNI: Consider adding losartan 12.5 mg daily. -MRA: Consider adding spironolactone  25 mg daily.  -SGLT2i: Consider adding SGLT2i pending copay.  Plan: 1) Medication changes recommended at this time: -Consider adding spironolactone  25 mg daily -Consider adding losartan 12.5 mg daily  2) Patient assistance: -Pending copay for Entresto, Farxiga, and Jardiance  3) Education: - Patient has been educated on current HF medications  and potential additions to HF medication regimen - Patient verbalizes understanding that over the next few months, these medication doses may change and more medications may be added to optimize HF regimen - Patient has been educated on basic disease state pathophysiology and goals of therapy  Medication Assistance / Insurance Benefits Check: Does the patient have prescription insurance?    Please do not hesitate to reach out with questions or concerns,  Jaun Bash, PharmD, CPP, BCPS, [facility] Heart Failure Pharmacist  Phone - [phone] 10/24/2024 12:10 PM

## 2024-10-24 NOTE — Progress Notes (Signed)
 Heart Failure Nurse Navigator Progress Note  PCP: Kayla Jeoffrey RAMAN, FNP PCP-Cardiologist: None Admission Diagnosis: None Admitted from: Home  [facility] Patient: Patient contacted remotely via telephone to her room 332 @ [facility].  2 Patient identifiers confirmed prior to providing CHF education and scheduling CHF [facility] appointment.  Presentation:   Michele Craig is a 53 y.o female who presented with increased swelling to bilateral lower extremities and abdomen that had been ongoing for approximately the past 4 weeks.  Per notes, she was evaluated at another emergency department during which time was placed on 20 mg Lasix  and she increased this to 40 mg with no improvement in her symptoms.  Workup at that time was unremarkable and she was discharged home. Symptoms continued to worsen  despite compliance with medications.  Also complained of SOB worsening with exertion and associated orthopnea. HS-Troponin <15. Pro-BNP- <50. Chest x-ray:Low volume film with pulmonary vascular congestion. Patient with medical history of Chronic Systolic Heart Failure per ECHO 2023, Rheumatoid arthritis, Obesity, Fibromyalgia, Migraine Headache,Morbid Obesity s/p bariatric surgery, DM, COPD & OSA.   ECHO/ LVEF: 40-45% 2023 & 50-55% on 10/23/2024  Clinical Course:  Past Medical History:  Diagnosis Date   Anemia    Anxiety    Arthritis    Complication of anesthesia    with carpal tunnel syndrome the block didn't work well, she could feel things during the surgery   Depression    Family history of adverse reaction to anesthesia    unknown, pt is adopted   Fibromyalgia    Foot pain    GERD (gastroesophageal reflux disease)    Headache    Hypertension    IBS (irritable bowel syndrome)    after cholecystectomy and then it went away after Gastric sleeve   LBBB (left bundle branch block)    Moderate persistent asthma    OSA (obstructive sleep apnea)    does not use cpap   RA (rheumatoid  arthritis) (HCC)    Shortness of breath dyspnea    only when her asthma is flaring up   Type 2 diabetes mellitus (HCC)    pt states after her gastric sleeve procedure she is no longer on medications and is not checked for it     Social History   Socioeconomic History   Marital status: Married    Spouse name: Cornell   Number of children: Not on file   Years of education: Not on file   Highest education level: Not on file  Occupational History   Not on file  Tobacco Use   Smoking status: Never   Smokeless tobacco: Never  Vaping Use   Vaping status: Never Used  Substance and Sexual Activity   Alcohol use: Not Currently   Drug use: No   Sexual activity: Yes    Birth control/protection: I.U.D.    Comment: 1st intercourse 66 yo-5 partners  Other Topics Concern   Not on file  Social History Narrative   Not on file   Social Drivers of Health   Tobacco Use: Low Risk (10/23/2024)   Patient History    Smoking Tobacco Use: Never    Smokeless Tobacco Use: Never    Passive Exposure: Not on file  Financial Resource Strain: Not on file  Food Insecurity: No Food Insecurity (10/23/2024)   Epic    Worried About Programme Researcher, Broadcasting/film/video in the Last Year: Never true    Ran Out of Food in the Last Year: Never true  Transportation Needs: No Transportation Needs (10/23/2024)   Epic    Lack of Transportation (Medical): No    Lack of Transportation (Non-Medical): No  Physical Activity: Not on file  Stress: Not on file  Social Connections: Not on file  Depression (PHQ2-9): High Risk (06/11/2023)   Depression (PHQ2-9)    PHQ-2 Score: 19  Alcohol Screen: Not on file  Housing: Low Risk (10/23/2024)   Epic    Unable to Pay for Housing in the Last Year: No    Number of Times Moved in the Last Year: 0    Homeless in the Last Year: No  Utilities: Not At Risk (10/23/2024)   Epic    Threatened with loss of utilities: No  Health Literacy: Not on file   Education Assessment and  Provision: Detailed education and instructions provided on heart failure disease management including the following:  Signs and symptoms of Heart Failure When to call the physician Importance of daily weights Low sodium diet Fluid restriction Medication management Anticipated future follow-up appointments  Patient education given on each of the above topics.  Patient acknowledges understanding via teach back method and acceptance of all instructions.  Education Materials:  Living Better With Heart Failure Booklet, HF zone tool, & Daily Weight Tracker Tool.  Patient has scale at home: Yes Patient has pill box at home: No.  Dispenses all medications from pill bottles she stores in a box.    High Risk Criteria for Readmission and/or Poor Patient Outcomes: Heart failure hospital admissions (last 6 months): 0  No Show rate: 32% Difficult social situation: None determined Demonstrates medication adherence: Yes Primary Language: English Literacy level: Reading, Writing & Comprehension  Barriers of Care:   Daily Weights Diet & Fluid Restrictions  Considerations/Referrals:  Referral made to Heart Failure Pharmacist Stewardship: Yes Referral made to Heart Failure CSW/NCM TOC: No Referral made to Heart & Vascular TOC clinic: Yes. CHF Clinic [facility] 11/01/24 @ 2:45.  Items for Follow-up on DC/TOC: Daily Weights Diet and Fluid Restrictions Continued Heart Failure Education  Charmaine Pines, RN, BSN [facility] Heart Failure Navigator Secure Chat Only

## 2024-10-24 NOTE — Plan of Care (Signed)

## 2024-10-24 NOTE — Plan of Care (Signed)
°  Problem: Education: Goal: Ability to describe self-care measures that may prevent or decrease complications (Diabetes Survival Skills Education) will improve Outcome: Progressing   Problem: Coping: Goal: Ability to adjust to condition or change in health will improve Outcome: Progressing   Problem: Fluid Volume: Goal: Ability to maintain a balanced intake and output will improve Outcome: Progressing   Problem: Health Behavior/Discharge Planning: Goal: Ability to identify and utilize available resources and services will improve Outcome: Progressing Goal: Ability to manage health-related needs will improve Outcome: Progressing   Problem: Metabolic: Goal: Ability to maintain appropriate glucose levels will improve Outcome: Progressing   Problem: Nutritional: Goal: Maintenance of adequate nutrition will improve Outcome: Progressing   Problem: Skin Integrity: Goal: Risk for impaired skin integrity will decrease Outcome: Progressing   Problem: Tissue Perfusion: Goal: Adequacy of tissue perfusion will improve Outcome: Progressing   Problem: Education: Goal: Knowledge of General Education information will improve Description: Including pain rating scale, medication(s)/side effects and non-pharmacologic comfort measures Outcome: Progressing   Problem: Health Behavior/Discharge Planning: Goal: Ability to manage health-related needs will improve Outcome: Progressing   Problem: Nutritional: Goal: Progress toward achieving an optimal weight will improve Outcome: Not Progressing

## 2024-10-24 NOTE — Progress Notes (Signed)
 1300 ml of urine output with lasix .

## 2024-10-25 LAB — GLUCOSE, CAPILLARY
Glucose-Capillary: 94 mg/dL (ref 70–99)
Glucose-Capillary: 112 mg/dL — ABNORMAL HIGH (ref 70–99)
Glucose-Capillary: 97 mg/dL (ref 70–99)
Glucose-Capillary: 89 mg/dL (ref 70–99)

## 2024-10-25 LAB — BASIC METABOLIC PANEL WITH GFR
Anion gap: 8 (ref 5–15)
BUN: 12 mg/dL (ref 6–20)
CO2: 27 mmol/L (ref 22–32)
Calcium: 8.3 mg/dL — ABNORMAL LOW (ref 8.9–10.3)
Chloride: 106 mmol/L (ref 98–111)
Creatinine, Ser: 0.84 mg/dL (ref 0.44–1.00)
GFR, Estimated: >60 mL/min (ref >60)
Glucose, Bld: 92 mg/dL (ref 70–99)
Potassium: 3.4 mmol/L — ABNORMAL LOW (ref 3.5–5.1)
Sodium: 141 mmol/L (ref 135–145)

## 2024-10-25 LAB — MAGNESIUM: Magnesium: 2 mg/dL (ref 1.7–2.4)

## 2024-10-25 MED ORDER — POTASSIUM CHLORIDE CRYS ER 20 MEQ PO TBCR
40.0000 meq | EXTENDED_RELEASE_TABLET | Freq: Two times a day (BID) | ORAL | Status: AC
  Administered 2024-10-25 (×2): 40 meq via ORAL
  Filled 2024-10-25 (×2): qty 2

## 2024-10-25 MED ORDER — SUMATRIPTAN SUCCINATE 50 MG PO TABS
50.0000 mg | ORAL_TABLET | ORAL | Status: DC | PRN
  Administered 2024-10-25 – 2024-10-27 (×3): 50 mg via ORAL
  Filled 2024-10-25 (×3): qty 1

## 2024-10-25 NOTE — Progress Notes (Signed)
 Heart Failure Stewardship Pharmacy Note  PCP: Kayla Jeoffrey RAMAN, FNP PCP-Cardiologist: None  HPI: Michele Craig is a 53 y.o. female with carpal tunnel, fibromyalgia, HTN, IBS, LBBB, OSA, asthma, RA, T2DM who presented with bilateral LEE and worsening dyspnea on exertion. On admission, proBNP was <50, HS-troponin was <15. Chest x-ray noted pulmonary vascular congestion.    Pertinent cardiac history: TTE in 05/2022 noted LVEF of 40-45%, moderate asymmetric LVH. At the same time a stress test was performed and determined high risk. LHC 05/2022 with clean coronaries and notable asynchrony in wall motion due to LBBB. TTE this admission pending.  Pertinent Lab Values: Creat  Date Value Ref Range Status  12/02/2022 0.90 0.50 - 1.03 mg/dL Final   Creatinine, Ser  Date Value Ref Range Status  10/25/2024 0.84 0.44 - 1.00 mg/dL Final   BUN  Date Value Ref Range Status  10/25/2024 12 6 - 20 mg/dL Final   Potassium  Date Value Ref Range Status  10/25/2024 3.4 (L) 3.5 - 5.1 mmol/L Final   Sodium  Date Value Ref Range Status  10/25/2024 141 135 - 145 mmol/L Final   Magnesium   Date Value Ref Range Status  10/25/2024 2.0 1.7 - 2.4 mg/dL Final    Comment:    Performed at [facility], [address] [place], [place] [address]   Hgb A1c MFr Bld  Date Value Ref Range Status  10/23/2024 4.9 4.8 - 5.6 % Final    Comment:    (NOTE) Diagnosis of Diabetes The following HbA1c ranges recommended by the American Diabetes Association (ADA) may be used as an aid in the diagnosis of diabetes mellitus.  Hemoglobin             Suggested A1C NGSP%              Diagnosis  <5.7                   Non Diabetic  5.7-6.4                Pre-Diabetic  >6.4                   Diabetic  <7.0                   Glycemic control for                       adults with diabetes.     LDH  Date Value Ref Range Status  03/26/2010 177 94 - 250 U/L Final    Vital Signs: Temp:  [98.4 F (36.9 C)-98.9 F  (37.2 C)] 98.6 F (37 C) (12/16 0507) Pulse Rate:  [78-88] 79 (12/16 0507) Cardiac Rhythm: Heart block (12/15 1943) Resp:  [18-20] 18 (12/16 0507) BP: (114-127)/(84-89) 117/84 (12/16 0507) SpO2:  [94 %-98 %] 96 % (12/16 0747) Weight:  [139.9 kg (308 lb 6.4 oz)] 139.9 kg (308 lb 6.4 oz) (12/16 0507)  Intake/Output Summary (Last 24 hours) at 10/25/2024 0824 Last data filed at 10/25/2024 0514 Gross per 24 hour  Intake 360 ml  Output 1150 ml  Net -790 ml    Current Heart Failure Medications:  Loop diuretic: furosemide  80 mg IV q12h Beta-Blocker:  none ACEI/ARB/ARNI: none MRA: none SGLT2i: none Other: none  Prior to admission Heart Failure Medications:  Loop diuretic: furosemide  20 mg daily Beta-Blocker: none ACEI/ARB/ARNI: none MRA: none SGLT2i: none Other: none  Assessment: 1. Acute on chronic systolic  heart failure (LVEF 40-45%)  , due to presumed LBBB. NYHA class III symptoms.  -Symptoms: Patient still with some shortness of breath and orthopnea, similar to yesterday. Appetite is reduced. Reports LEE is improving, but legs are painful today. -Volume: Unable to perform physical exam for remote visit, but based on patient reported symptoms, suspect she is still hypervolemic. Reports good urine output on furosemide  80 mg IV q12h. Would continue current dose at this time. -Hemodynamics: BP stable. HR 80s. -BB: Would consider adding prior to discharge when euvolemia is reached. -ACEI/ARB/ARNI: Consider adding losartan 12.5 mg daily. -MRA: Consider adding spironolactone  25 mg daily. K is low. -SGLT2i: Consider adding SGLT2i at follow up. Deductible make Doreen and Jardiance ~$550 each and this would need to be paid again in January.  Plan: 1) Medication changes recommended at this time: -Consider adding spironolactone  25 mg daily -Consider adding losartan 12.5 mg daily  2) Patient assistance: -Pending copay for Entresto, Farxiga, and Jardiance  3) Education: - Patient  has been educated on current HF medications and potential additions to HF medication regimen - Patient verbalizes understanding that over the next few months, these medication doses may change and more medications may be added to optimize HF regimen - Patient has been educated on basic disease state pathophysiology and goals of therapy  Medication Assistance / Insurance Benefits Check: Does the patient have prescription insurance?    Please do not hesitate to reach out with questions or concerns,  Jaun Bash, PharmD, CPP, BCPS, [facility] Heart Failure Pharmacist  Phone - [phone] 10/25/2024 8:24 AM

## 2024-10-25 NOTE — Plan of Care (Signed)

## 2024-10-25 NOTE — Progress Notes (Signed)
 PROGRESS NOTE    Michele Craig  FMW:[id number] DOB: [birth year] DOA: 10/23/2024 PCP: Kayla Jeoffrey RAMAN, FNP   Brief Narrative:    Michele Craig is a 53 y.o. female with medical history significant of chronic systolic heart failure per echocardiogram 2023, rheumatoid arthritis, obesity, fibromyalgia, headache who presents to the ER with complaints of worsening bilateral lower extremity edema, abdominal wall edema and exertional shortness of breath that has been worsening.  She has been admitted for acute on chronic systolic heart failure and has been started on IV Lasix  for diuresis.  Assessment & Plan:   Principal Problem:   Acute on chronic systolic heart failure (HCC) Active Problems:   Morbid obesity (HCC)   DM (diabetes mellitus) (HCC)   Obstructive sleep apnea   Status post bariatric surgery   Chronic systolic CHF (congestive heart failure) (HCC)  Assessment and Plan:   Acute on chronic diastolic heart failure/hypervolemia 2D echocardiogram with preserved LVEF and indeterminate diastolic values on 12/15 Patient does not appear to be on GDMT Continue IV Lasix  80 mg twice daily starting this evening for goal of 2-3 L output each day.  Baseline weight around 300 pounds and currently at 308.  Mild hypokalemia Related to diuresis, replete and reevaluate  Migraine headaches Sumatriptan  ordered   Bilateral lower extremity edema: Left more than right, Doppler is negative for DVT Will continue diuresis as above.   History of rheumatoid arthritis   History of obesity, gastric bypass/OSA   History of COPD Resume home medications   DVT prophylaxis:Lovenox  Code Status: Full Family Communication: None at bedside Disposition Plan:  Status is: Observation The patient will require care spanning > 2 midnights and should be moved to inpatient because: Need for ongoing IV diuresis.   Consultants:  None  Procedures:  None  Antimicrobials:  None   Subjective: Patient  seen and evaluated today with improvement in overall swelling and shortness of breath noted.  She continues to have some migraine headaches and leg cramping today.  Objective: Vitals:   10/24/24 1304 10/24/24 2116 10/25/24 0507 10/25/24 0747  BP: 114/86 127/89 117/84   Pulse: 88 78 79   Resp:  20 18   Temp: 98.9 F (37.2 C) 98.4 F (36.9 C) 98.6 F (37 C)   TempSrc: Oral Oral Oral   SpO2: 98% 96% 94% 96%  Weight:   (!) 139.9 kg   Height:        Intake/Output Summary (Last 24 hours) at 10/25/2024 1201 Last data filed at 10/25/2024 0514 Gross per 24 hour  Intake --  Output 1150 ml  Net -1150 ml   Filed Weights   10/23/24 1424 10/24/24 0437 10/25/24 0507  Weight: (!) 141.5 kg (!) 140.9 kg (!) 139.9 kg    Examination:  General exam: Appears calm and comfortable, morbidly obese Respiratory system: Clear to auscultation. Respiratory effort normal. Cardiovascular system: S1 & S2 heard, RRR.  Gastrointestinal system: Abdomen is soft Central nervous system: Alert and awake Extremities: No edema Skin: No significant lesions noted Psychiatry: Flat affect.    Data Reviewed: I have personally reviewed following labs and imaging studies  CBC: Recent Labs  Lab 10/23/24 0842 10/24/24 0455  WBC 6.0 5.9  NEUTROABS 2.5  --   HGB 13.4 14.3  HCT 40.3 43.8  MCV 90.6 91.4  PLT 369 344   Basic Metabolic Panel: Recent Labs  Lab 10/23/24 0842 10/24/24 0455 10/25/24 0522  NA 141 141 141  K 3.4* 4.0 3.4*  CL 110  108 106  CO2 26 27 27   GLUCOSE 93 90 92  BUN 9 11 12   CREATININE 0.74 0.87 0.84  CALCIUM 8.0* 8.3* 8.3*  MG 1.9  --  2.0   GFR: Estimated Creatinine Clearance: 113.6 mL/min (by C-G formula based on SCr of 0.84 mg/dL). Liver Function Tests: Recent Labs  Lab 10/23/24 0842  AST 20  ALT 20  ALKPHOS 146*  BILITOT 0.3  PROT 6.0*  ALBUMIN 3.7   No results for input(s): LIPASE, AMYLASE in the last 168 hours. No results for input(s): AMMONIA in the last  168 hours. Coagulation Profile: No results for input(s): INR, PROTIME in the last 168 hours. Cardiac Enzymes: No results for input(s): CKTOTAL, CKMB, CKMBINDEX, TROPONINI in the last 168 hours. BNP (last 3 results) Recent Labs    10/23/24 0842  PROBNP <50.0   HbA1C: Recent Labs    10/23/24 0842  HGBA1C 4.9   CBG: Recent Labs  Lab 10/23/24 2136 10/24/24 0732 10/24/24 2105 10/25/24 0734 10/25/24 1152  GLUCAP 73 100* 90 94 112*   Lipid Profile: No results for input(s): CHOL, HDL, LDLCALC, TRIG, CHOLHDL, LDLDIRECT in the last 72 hours. Thyroid Function Tests: No results for input(s): TSH, T4TOTAL, FREET4, T3FREE, THYROIDAB in the last 72 hours. Anemia Panel: No results for input(s): VITAMINB12, FOLATE, FERRITIN, TIBC, IRON, RETICCTPCT in the last 72 hours. Sepsis Labs: No results for input(s): PROCALCITON, LATICACIDVEN in the last 168 hours.  No results found for this or any previous visit (from the past 240 hours).       Radiology Studies: ECHOCARDIOGRAM COMPLETE Result Date: 10/24/2024    ECHOCARDIOGRAM REPORT   Patient Name:   Michele Craig Date of Exam: 10/24/2024 Medical Rec #:  [id number]      Height:       67.0 in Accession #:    [id number]     Weight:       310.6 lb Date of Birth:  [birth year]      BSA:          2.438 m Patient Age:    53 years       BP:           0/0 mmHg Patient Gender: F              HR:           72 bpm. Exam Location:  [facility] Procedure: 2D Echo, Cardiac Doppler, Color Doppler and Intracardiac            Opacification Agent (Both Spectral and Color Flow Doppler were            utilized during procedure). Indications:    Congestive Heart Failure  History:        Patient has prior history of Echocardiogram examinations, most                 recent 05/28/2022. CHF, Signs/Symptoms:Edema; Risk                 Factors:Diabetes, Sleep Apnea, Hypertension and Dyslipidemia.  Sonographer:    Merlynn Argyle  Referring Phys: [id number] [facility] SIGDEL  Sonographer Comments: Suboptimal subcostal window. Image acquisition challenging due to patient body habitus. IMPRESSIONS  1. Left ventricular ejection fraction, by estimation, is 50 to 55%. The left ventricle has low normal function. The left ventricle has no regional wall motion abnormalities. There is mild asymmetric left ventricular hypertrophy of the basal-septal segment. Left ventricular diastolic parameters are indeterminate.  2. Right ventricular  systolic function is normal. The right ventricular size is normal.  3. The mitral valve is normal in structure. No evidence of mitral valve regurgitation. No evidence of mitral stenosis.  4. The aortic valve is tricuspid. Aortic valve regurgitation is not visualized. No aortic stenosis is present.  5. The inferior vena cava is normal in size with greater than 50% respiratory variability, suggesting right atrial pressure of 3 mmHg. FINDINGS  Left Ventricle: Left ventricular ejection fraction, by estimation, is 50 to 55%. The left ventricle has low normal function. The left ventricle has no regional wall motion abnormalities. Definity  contrast agent was given IV to delineate the left ventricular endocardial borders. The left ventricular internal cavity size was normal in size. There is mild asymmetric left ventricular hypertrophy of the basal-septal segment. Left ventricular diastolic parameters are indeterminate. Right Ventricle: The right ventricular size is normal. Right vetricular wall thickness was not well visualized. Right ventricular systolic function is normal. Left Atrium: Left atrial size was normal in size. Right Atrium: Right atrial size was normal in size. Pericardium: There is no evidence of pericardial effusion. Mitral Valve: The mitral valve is normal in structure. No evidence of mitral valve regurgitation. No evidence of mitral valve stenosis. Tricuspid Valve: The tricuspid valve is normal in structure.  Tricuspid valve regurgitation is not demonstrated. No evidence of tricuspid stenosis. Aortic Valve: The aortic valve is tricuspid. Aortic valve regurgitation is not visualized. No aortic stenosis is present. Aortic valve mean gradient measures 3.5 mmHg. Aortic valve peak gradient measures 7.8 mmHg. Aortic valve area, by VTI measures 2.58 cm. Pulmonic Valve: The pulmonic valve was not well visualized. Pulmonic valve regurgitation is mild. No evidence of pulmonic stenosis. Aorta: The aortic root and ascending aorta are structurally normal, with no evidence of dilitation. Venous: The inferior vena cava is normal in size with greater than 50% respiratory variability, suggesting right atrial pressure of 3 mmHg. IAS/Shunts: The interatrial septum was not well visualized.  LEFT VENTRICLE PLAX 2D LVIDd:         5.50 cm   Diastology LVIDs:         2.95 cm   LV e' medial:    7.62 cm/s LV PW:         1.00 cm   LV E/e' medial:  15.7 LV IVS:        0.90 cm   LV e' lateral:   6.00 cm/s LVOT diam:     2.10 cm   LV E/e' lateral: 20.0 LV SV:         58 LV SV Index:   24 LVOT Area:     3.46 cm  RIGHT VENTRICLE             IVC RV S prime:     13.90 cm/s  IVC diam: 2.10 cm TAPSE (M-mode): 2.0 cm LEFT ATRIUM             Index        RIGHT ATRIUM           Index LA diam:        4.60 cm 1.89 cm/m   RA Area:     15.70 cm LA Vol (A2C):   43.8 ml 17.96 ml/m  RA Volume:   39.40 ml  16.16 ml/m LA Vol (A4C):   52.6 ml 21.57 ml/m LA Biplane Vol: 50.4 ml 20.67 ml/m  AORTIC VALVE  PULMONIC VALVE AV Area (Vmax):    1.95 cm     PR End Diast Vel: 4.08 msec AV Area (Vmean):   2.21 cm     RVOT Peak grad:   2 mmHg AV Area (VTI):     2.58 cm AV Vmax:           139.80 cm/s AV Vmean:          84.253 cm/s AV VTI:            0.225 m AV Peak Grad:      7.8 mmHg AV Mean Grad:      3.5 mmHg LVOT Vmax:         78.60 cm/s LVOT Vmean:        53.800 cm/s LVOT VTI:          0.168 m LVOT/AV VTI ratio: 0.75  AORTA Ao Root diam: 3.00 cm Ao  Asc diam:  2.90 cm MITRAL VALVE MV Area (PHT): 3.66 cm     SHUNTS MV Decel Time: 207 msec     Systemic VTI:  0.17 m MV E velocity: 120.00 cm/s  Systemic Diam: 2.10 cm MV A velocity: 78.40 cm/s   Pulmonic VTI:  0.130 m MV E/A ratio:  1.53 Dorn Ross MD Electronically signed by Dorn Ross MD Signature Date/Time: 10/24/2024/1:57:45 PM    Final         Scheduled Meds:  ARIPiprazole   10 mg Oral q morning   buPROPion   300 mg Oral q morning   DULoxetine   60 mg Oral q morning   enoxaparin  (LOVENOX ) injection  70 mg Subcutaneous Q24H   fluticasone  furoate-vilanterol  1 puff Inhalation Daily   furosemide   80 mg Intravenous Q12H   pantoprazole   40 mg Oral Daily   potassium chloride   40 mEq Oral BID   traZODone   100 mg Oral QHS     LOS: 0 days    Time spent: 55 minutes    Fradel Baldonado JONETTA Fairly, DO [facility]  If 7PM-7AM, please contact night-coverage [email or web address] 10/25/2024, 12:01 PM

## 2024-10-26 DIAGNOSIS — I5022 Chronic systolic (congestive) heart failure: Secondary | ICD-10-CM | POA: Diagnosis not present

## 2024-10-26 DIAGNOSIS — Z9884 Bariatric surgery status: Secondary | ICD-10-CM | POA: Diagnosis not present

## 2024-10-26 DIAGNOSIS — I5023 Acute on chronic systolic (congestive) heart failure: Secondary | ICD-10-CM | POA: Diagnosis not present

## 2024-10-26 DIAGNOSIS — E1159 Type 2 diabetes mellitus with other circulatory complications: Secondary | ICD-10-CM | POA: Diagnosis not present

## 2024-10-26 DIAGNOSIS — G4733 Obstructive sleep apnea (adult) (pediatric): Secondary | ICD-10-CM | POA: Diagnosis not present

## 2024-10-26 LAB — GLUCOSE, CAPILLARY
Glucose-Capillary: 110 mg/dL — ABNORMAL HIGH (ref 70–99)
Glucose-Capillary: 96 mg/dL (ref 70–99)
Glucose-Capillary: 87 mg/dL (ref 70–99)
Glucose-Capillary: 95 mg/dL (ref 70–99)

## 2024-10-26 LAB — MAGNESIUM: Magnesium: 2 mg/dL (ref 1.7–2.4)

## 2024-10-26 LAB — BASIC METABOLIC PANEL WITH GFR
Anion gap: 9 (ref 5–15)
BUN: 13 mg/dL (ref 6–20)
CO2: 27 mmol/L (ref 22–32)
Calcium: 8.5 mg/dL — ABNORMAL LOW (ref 8.9–10.3)
Chloride: 103 mmol/L (ref 98–111)
Creatinine, Ser: 0.91 mg/dL (ref 0.44–1.00)
GFR, Estimated: >60 mL/min (ref >60)
Glucose, Bld: 93 mg/dL (ref 70–99)
Potassium: 3.5 mmol/L (ref 3.5–5.1)
Sodium: 139 mmol/L (ref 135–145)

## 2024-10-26 MED ORDER — SPIRONOLACTONE 25 MG PO TABS
25.0000 mg | ORAL_TABLET | Freq: Every day | ORAL | Status: DC
  Administered 2024-10-26 – 2024-10-28 (×3): 25 mg via ORAL
  Filled 2024-10-26 (×3): qty 1

## 2024-10-26 NOTE — Progress Notes (Signed)
 Heart Failure Stewardship Pharmacy Note  PCP: Kayla Jeoffrey RAMAN, FNP PCP-Cardiologist: None  HPI: Michele Craig is a 53 y.o. female with carpal tunnel, fibromyalgia, HTN, IBS, LBBB, OSA, asthma, RA, T2DM who presented with bilateral LEE and worsening dyspnea on exertion. On admission, proBNP was <50, HS-troponin was <15. Chest x-ray noted pulmonary vascular congestion.    Pertinent cardiac history: TTE in 05/2022 noted LVEF of 40-45%, moderate asymmetric LVH. At the same time a stress test was performed and determined high risk. LHC 05/2022 with clean coronaries and notable asynchrony in wall motion due to LBBB. TTE this admission pending.  Pertinent Lab Values: Creat  Date Value Ref Range Status  12/02/2022 0.90 0.50 - 1.03 mg/dL Final   Creatinine, Ser  Date Value Ref Range Status  10/26/2024 0.91 0.44 - 1.00 mg/dL Final   BUN  Date Value Ref Range Status  10/26/2024 13 6 - 20 mg/dL Final   Potassium  Date Value Ref Range Status  10/26/2024 3.5 3.5 - 5.1 mmol/L Final   Sodium  Date Value Ref Range Status  10/26/2024 139 135 - 145 mmol/L Final   Magnesium   Date Value Ref Range Status  10/26/2024 2.0 1.7 - 2.4 mg/dL Final    Comment:    Performed at [facility], [address], [place], [place] [address]   Hgb A1c MFr Bld  Date Value Ref Range Status  10/23/2024 4.9 4.8 - 5.6 % Final    Comment:    (NOTE) Diagnosis of Diabetes The following HbA1c ranges recommended by the American Diabetes Association (ADA) may be used as an aid in the diagnosis of diabetes mellitus.  Hemoglobin             Suggested A1C NGSP%              Diagnosis  <5.7                   Non Diabetic  5.7-6.4                Pre-Diabetic  >6.4                   Diabetic  <7.0                   Glycemic control for                       adults with diabetes.     LDH  Date Value Ref Range Status  03/26/2010 177 94 - 250 U/L Final    Vital Signs: Temp:  [98.4 F (36.9 C)-99.2 F (37.3  C)] 98.5 F (36.9 C) (12/17 0759) Pulse Rate:  [75-87] 87 (12/17 0425) Cardiac Rhythm: Heart block;Bundle branch block (12/16 1930) Resp:  [18] 18 (12/17 0425) BP: (112-128)/(82-96) 128/96 (12/17 0425) SpO2:  [95 %-99 %] 95 % (12/17 0425) Weight:  [138.2 kg (304 lb 10.8 oz)] 138.2 kg (304 lb 10.8 oz) (12/17 0425)  Intake/Output Summary (Last 24 hours) at 10/26/2024 0840 Last data filed at 10/26/2024 0450 Gross per 24 hour  Intake 1200 ml  Output 4500 ml  Net -3300 ml    Current Heart Failure Medications:  Loop diuretic: furosemide  80 mg IV q12h Beta-Blocker:  none ACEI/ARB/ARNI: none MRA: none SGLT2i: none Other: none  Prior to admission Heart Failure Medications:  Loop diuretic: furosemide  20 mg daily Beta-Blocker: none ACEI/ARB/ARNI: none MRA: none SGLT2i: none Other: none  Assessment: 1. Acute on chronic  systolic heart failure (LVEF 40-45%)  , due to presumed LBBB. NYHA class III symptoms.  -Symptoms: Patient still with some shortness of breath and orthopnea. Appetite is reduced. Reports LEE continues to improve and leg pain is persistent. -Volume: Unable to perform physical exam for remote visit, but based on patient reported symptoms urine output, labs, and urine color, suspect she is still hypervolemic.Currently on furosemide  80 mg IV q12h. Would continue current dose at this time. -Hemodynamics: BP stable. HR 80s. -BB: Would consider adding prior to discharge when euvolemia is reached. -ACEI/ARB/ARNI: Consider adding losartan 12.5 mg daily tomorrow if BP stable on spironolactone .  -MRA: Consider adding spironolactone  25 mg daily. K is low. -SGLT2i: Consider adding SGLT2i at follow up. Deductible make Doreen and Jardiance ~$550 each and this would need to be paid again in January.  Plan: 1) Medication changes recommended at this time: -Consider adding spironolactone  25 mg daily   2) Patient assistance: -Entresto copay is $30 -Doreen copay is $556.52 and  Jardiance copay is $584.09 due to deductible  3) Education: - Patient has been educated on current HF medications and potential additions to HF medication regimen - Patient verbalizes understanding that over the next few months, these medication doses may change and more medications may be added to optimize HF regimen - Patient has been educated on basic disease state pathophysiology and goals of therapy  Medication Assistance / Insurance Benefits Check: Does the patient have prescription insurance?    Please do not hesitate to reach out with questions or concerns,  Jaun Bash, PharmD, CPP, BCPS, [facility] Heart Failure Pharmacist  Phone - [phone] 10/26/2024 8:40 AM

## 2024-10-26 NOTE — Hospital Course (Signed)
 53 y.o. female with medical history significant of chronic systolic heart failure per echocardiogram 2023, rheumatoid arthritis, obesity, fibromyalgia, headache who presents to the ER with complaints of worsening bilateral lower extremity edema, abdominal wall edema and exertional shortness of breath that has been worsening.  She has been admitted for acute on chronic systolic heart failure and has been started on IV Lasix  for diuresis.

## 2024-10-26 NOTE — Progress Notes (Addendum)
 PROGRESS NOTE   Michele Craig  FMW:[id number] DOB: Aug 05, 1971 DOA: 10/23/2024 PCP: Kayla Jeoffrey RAMAN, FNP   Chief Complaint  Patient presents with   Claudication   Level of care: Telemetry  Brief Admission History:  53 y.o. female with medical history significant of chronic systolic heart failure per echocardiogram 2023, rheumatoid arthritis, obesity, fibromyalgia, headache who presents to the ER with complaints of worsening bilateral lower extremity edema, abdominal wall edema and exertional shortness of breath that has been worsening.  She has been admitted for acute on chronic systolic heart failure and has been started on IV Lasix  for diuresis.    Assessment and Plan:  Acute on chronic diastolic heart failure/hypervolemia 2D echocardiogram with preserved LVEF and indeterminate diastolic values on 12/15 Patient not on GDMT at admission  Starting spironolactone  25 mg daily  Consider starting losartan 12.5 mg daily on 12/18 if BP can tolerate  SGLTi not started due to cost  Continue IV Lasix  80 mg twice daily starting this evening for goal of 2-3 L output each day.  Baseline weight around 300 pounds and currently at 308. Filed Weights   10/24/24 0437 10/25/24 0507 10/26/24 0425  Weight: (!) 140.9 kg (!) 139.9 kg (!) 138.2 kg    Intake/Output Summary (Last 24 hours) at 10/26/2024 1207 Last data filed at 10/26/2024 1045 Gross per 24 hour  Intake 1680 ml  Output 4700 ml  Net -3020 ml    Mild hypokalemia Related to diuresis, repleted Added spironolactone  25 mg daily on 12/17   Migraine headaches Sumatriptan  ordered   Bilateral lower extremity edema and pain  Left more than right, Doppler is negative for DVT Will continue diuresis as above. Start PT for ambulation    History of rheumatoid arthritis - stable    History of obesity, gastric bypass/OSA   History of COPD Resume home medications No s/s of acute exacerbation   DVT prophylaxis: enoxaparin   Code Status:  Full  Family Communication:  Disposition: home    Consultants:   Procedures:   Antimicrobials:    Subjective: Pt reporting she is still having headaches and left leg pain and swelling but overall swelling is going down.   Objective: Vitals:   10/25/24 1528 10/25/24 1952 10/26/24 0425 10/26/24 0759  BP: 118/82 112/83 (!) 128/96   Pulse: 83 75 87   Resp: 18 18 18    Temp: 98.4 F (36.9 C) 98.8 F (37.1 C) 99.2 F (37.3 C) 98.5 F (36.9 C)  TempSrc: Oral Oral Oral Oral  SpO2: 96% 99% 95%   Weight:   (!) 138.2 kg   Height:        Intake/Output Summary (Last 24 hours) at 10/26/2024 1205 Last data filed at 10/26/2024 1045 Gross per 24 hour  Intake 1680 ml  Output 4700 ml  Net -3020 ml   Filed Weights   10/24/24 0437 10/25/24 0507 10/26/24 0425  Weight: (!) 140.9 kg (!) 139.9 kg (!) 138.2 kg   Examination:  General exam: morbidly obese female, awake, alert, cooperative, Appears calm and comfortable  Respiratory system: Clear to auscultation. Respiratory effort normal. Cardiovascular system: normal S1 & S2 heard. No JVD, murmurs, rubs, gallops or clicks. 2+ pedal edema. Gastrointestinal system: Abdomen is nondistended, soft and nontender. No organomegaly or masses felt. Normal bowel sounds heard. Central nervous system: Alert and oriented. No focal neurological deficits. Extremities: 2+ edema Symmetric 5 x 5 power. Skin: No rashes, lesions or ulcers. Psychiatry: Judgement and insight appear normal. Mood & affect appropriate.  Data Reviewed: I have personally reviewed following labs and imaging studies  CBC: Recent Labs  Lab 10/23/24 0842 10/24/24 0455  WBC 6.0 5.9  NEUTROABS 2.5  --   HGB 13.4 14.3  HCT 40.3 43.8  MCV 90.6 91.4  PLT 369 344    Basic Metabolic Panel: Recent Labs  Lab 10/23/24 0842 10/24/24 0455 10/25/24 0522 10/26/24 0447  NA 141 141 141 139  K 3.4* 4.0 3.4* 3.5  CL 110 108 106 103  CO2 26 27 27 27   GLUCOSE 93 90 92 93  BUN 9 11  12 13   CREATININE 0.74 0.87 0.84 0.91  CALCIUM 8.0* 8.3* 8.3* 8.5*  MG 1.9  --  2.0 2.0    CBG: Recent Labs  Lab 10/25/24 0734 10/25/24 1152 10/25/24 1656 10/25/24 1939 10/26/24 0742  GLUCAP 94 112* 97 89 110*    No results found for this or any previous visit (from the past 240 hours).   Radiology Studies: No results found.  Scheduled Meds:  ARIPiprazole   10 mg Oral q morning   buPROPion   300 mg Oral q morning   DULoxetine   60 mg Oral q morning   enoxaparin  (LOVENOX ) injection  70 mg Subcutaneous Q24H   fluticasone  furoate-vilanterol  1 puff Inhalation Daily   furosemide   80 mg Intravenous Q12H   pantoprazole   40 mg Oral Daily   spironolactone   25 mg Oral Daily   traZODone   100 mg Oral QHS   Continuous Infusions:   LOS: 0 days   Time spent: 55 mins  Anavey Coombes Vicci, MD How to contact the [facility] Attending or Consulting provider 7A - 7P or covering provider during after hours 7P -7A, for this patient?  Check the care team in [facility] and look for a) attending/consulting TRH provider listed and b) the TRH team listed Log into [email or web address] to find provider on call.  Locate the TRH provider you are looking for under [facility] and page to a number that you can be directly reached. If you still have difficulty reaching the provider, please page the [facility] (Director on Call) for the Hospitalists listed on amion for assistance.  10/26/2024, 12:05 PM

## 2024-10-26 NOTE — Plan of Care (Signed)
   Problem: Education: Goal: Ability to describe self-care measures that may prevent or decrease complications (Diabetes Survival Skills Education) will improve Outcome: Progressing Goal: Individualized Educational Video(s) Outcome: Progressing

## 2024-10-26 NOTE — Plan of Care (Signed)
°  Problem: Education: Goal: Ability to describe self-care measures that may prevent or decrease complications (Diabetes Survival Skills Education) will improve Outcome: Progressing   Problem: Fluid Volume: Goal: Ability to maintain a balanced intake and output will improve Outcome: Progressing   Problem: Metabolic: Goal: Ability to maintain appropriate glucose levels will improve Outcome: Progressing   Problem: Clinical Measurements: Goal: Cardiovascular complication will be avoided Outcome: Progressing   Problem: Pain Managment: Goal: General experience of comfort will improve and/or be controlled Outcome: Progressing   Problem: Safety: Goal: Ability to remain free from injury will improve Outcome: Progressing   Problem: Education: Goal: Ability to demonstrate management of disease process will improve Outcome: Progressing   Problem: Activity: Goal: Capacity to carry out activities will improve Outcome: Progressing   Problem: Cardiac: Goal: Ability to achieve and maintain adequate cardiopulmonary perfusion will improve Outcome: Progressing

## 2024-10-27 DIAGNOSIS — G4733 Obstructive sleep apnea (adult) (pediatric): Secondary | ICD-10-CM | POA: Diagnosis not present

## 2024-10-27 DIAGNOSIS — E1159 Type 2 diabetes mellitus with other circulatory complications: Secondary | ICD-10-CM | POA: Diagnosis not present

## 2024-10-27 DIAGNOSIS — I5022 Chronic systolic (congestive) heart failure: Secondary | ICD-10-CM | POA: Diagnosis not present

## 2024-10-27 DIAGNOSIS — I5023 Acute on chronic systolic (congestive) heart failure: Secondary | ICD-10-CM | POA: Diagnosis not present

## 2024-10-27 LAB — BASIC METABOLIC PANEL WITH GFR
Anion gap: 9 (ref 5–15)
BUN: 13 mg/dL (ref 6–20)
CO2: 27 mmol/L (ref 22–32)
Calcium: 8.6 mg/dL — ABNORMAL LOW (ref 8.9–10.3)
Chloride: 101 mmol/L (ref 98–111)
Creatinine, Ser: 0.98 mg/dL (ref 0.44–1.00)
GFR, Estimated: >60 mL/min (ref >60)
Glucose, Bld: 95 mg/dL (ref 70–99)
Potassium: 3.3 mmol/L — ABNORMAL LOW (ref 3.5–5.1)
Sodium: 138 mmol/L (ref 135–145)

## 2024-10-27 LAB — GLUCOSE, CAPILLARY
Glucose-Capillary: 99 mg/dL (ref 70–99)
Glucose-Capillary: 93 mg/dL (ref 70–99)
Glucose-Capillary: 109 mg/dL — ABNORMAL HIGH (ref 70–99)
Glucose-Capillary: 107 mg/dL — ABNORMAL HIGH (ref 70–99)

## 2024-10-27 LAB — MAGNESIUM: Magnesium: 2 mg/dL (ref 1.7–2.4)

## 2024-10-27 MED ORDER — ACETAMINOPHEN 650 MG RE SUPP
650.0000 mg | Freq: Four times a day (QID) | RECTAL | Status: DC
  Filled 2024-10-27: qty 1

## 2024-10-27 MED ORDER — LORATADINE 10 MG PO TABS
10.0000 mg | ORAL_TABLET | Freq: Every day | ORAL | Status: DC
  Administered 2024-10-27 – 2024-10-28 (×2): 10 mg via ORAL
  Filled 2024-10-27 (×2): qty 1

## 2024-10-27 MED ORDER — POTASSIUM CHLORIDE CRYS ER 20 MEQ PO TBCR
40.0000 meq | EXTENDED_RELEASE_TABLET | Freq: Once | ORAL | Status: AC
  Administered 2024-10-27: 10:00:00 40 meq via ORAL
  Filled 2024-10-27: qty 2

## 2024-10-27 MED ORDER — ACETAMINOPHEN 500 MG PO TABS
1000.0000 mg | ORAL_TABLET | Freq: Four times a day (QID) | ORAL | Status: DC
  Administered 2024-10-27 – 2024-10-28 (×4): 1000 mg via ORAL
  Filled 2024-10-27 (×4): qty 2

## 2024-10-27 MED ORDER — PREGABALIN 75 MG PO CAPS
75.0000 mg | ORAL_CAPSULE | Freq: Every day | ORAL | Status: DC
  Administered 2024-10-27 – 2024-10-28 (×2): 75 mg via ORAL
  Filled 2024-10-27 (×2): qty 1

## 2024-10-27 MED ORDER — CLONAZEPAM 0.5 MG PO TABS
2.0000 mg | ORAL_TABLET | Freq: Every day | ORAL | Status: DC
  Administered 2024-10-27: 22:00:00 2 mg via ORAL
  Filled 2024-10-27: qty 4

## 2024-10-27 MED ORDER — LEVOCETIRIZINE DIHYDROCHLORIDE 5 MG PO TABS: 5.0000 mg | ORAL_TABLET | Freq: Every evening | ORAL | Status: DC

## 2024-10-27 MED ORDER — BUPROPION HCL ER (XL) 150 MG PO TB24
150.0000 mg | ORAL_TABLET | Freq: Every morning | ORAL | Status: DC
  Administered 2024-10-27 – 2024-10-28 (×2): 150 mg via ORAL
  Filled 2024-10-27 (×2): qty 1

## 2024-10-27 MED ORDER — MONTELUKAST SODIUM 10 MG PO TABS
10.0000 mg | ORAL_TABLET | Freq: Every day | ORAL | Status: DC
  Administered 2024-10-27: 22:00:00 10 mg via ORAL
  Filled 2024-10-27: qty 1

## 2024-10-27 MED ORDER — TORSEMIDE 20 MG PO TABS
40.0000 mg | ORAL_TABLET | Freq: Two times a day (BID) | ORAL | Status: DC
  Administered 2024-10-27 – 2024-10-28 (×2): 40 mg via ORAL
  Filled 2024-10-27 (×2): qty 2

## 2024-10-27 NOTE — Progress Notes (Signed)
 PROGRESS NOTE   Michele Craig  FMW:[id number] DOB: 12/31/1970 DOA: 10/23/2024 PCP: Kayla Jeoffrey RAMAN, FNP   Chief Complaint  Patient presents with   Claudication   Level of care: Telemetry  Brief Admission History:  53 y.o. female with medical history significant of chronic systolic heart failure per echocardiogram 2023, rheumatoid arthritis, obesity, fibromyalgia, headache who presents to the ER with complaints of worsening bilateral lower extremity edema, abdominal wall edema and exertional shortness of breath that has been worsening.  She has been admitted for acute on chronic systolic heart failure and has been started on IV Lasix  for diuresis.    Assessment and Plan:  Acute on chronic diastolic heart failure/hypervolemia 2D echocardiogram with preserved LVEF and indeterminate diastolic values on 12/15 Patient not on GDMT at admission  Started spironolactone  25 mg daily  Consider starting losartan 12.5 mg daily on 12/18 if BP can tolerate  SGLTi not started due to cost  Continue IV Lasix  80 mg twice daily for goal of 2-3 L output each day.  Baseline weight around 300 pounds and currently at 308. Plan to transition to oral torsemide  this evening.  Filed Weights   10/25/24 0507 10/26/24 0425 10/27/24 0404  Weight: (!) 139.9 kg (!) 138.2 kg (!) 137.8 kg    Intake/Output Summary (Last 24 hours) at 10/27/2024 1303 Last data filed at 10/27/2024 0830 Gross per 24 hour  Intake 300 ml  Output 1500 ml  Net -1200 ml    Mild hypokalemia Related to diuresis, repleted Added spironolactone  25 mg daily on 12/17 Follow BMP    Migraine headaches Sumatriptan  ordered   Bilateral lower extremity edema and pain  Left more than right, Doppler is negative for DVT Will continue diuresis as above. PT for ambulation    History of rheumatoid arthritis - stable    History of obesity, gastric bypass/OSA   History of COPD Resume home medications No s/s of acute exacerbation   DVT  prophylaxis: enoxaparin   Code Status: Full  Family Communication:  Disposition: home    Consultants:   Procedures:   Antimicrobials:    Subjective: Pt having pain in left leg, she continues to diurese well on lasix .   Objective: Vitals:   10/26/24 1412 10/26/24 2003 10/27/24 0404 10/27/24 0820  BP: 120/89 (!) 111/90 120/85   Pulse: 83 92 88   Resp: 18 19 18    Temp: 98.8 F (37.1 C) 99 F (37.2 C) 98.3 F (36.8 C)   TempSrc: Oral Oral Oral   SpO2: 96% 97% 97% 97%  Weight:   (!) 137.8 kg   Height:        Intake/Output Summary (Last 24 hours) at 10/27/2024 1303 Last data filed at 10/27/2024 0830 Gross per 24 hour  Intake 300 ml  Output 1500 ml  Net -1200 ml   Filed Weights   10/25/24 0507 10/26/24 0425 10/27/24 0404  Weight: (!) 139.9 kg (!) 138.2 kg (!) 137.8 kg   Examination:  General exam: morbidly obese female, awake, alert, cooperative, Appears calm and comfortable  Respiratory system: Clear to auscultation. Respiratory effort normal. Cardiovascular system: normal S1 & S2 Craig. No JVD, murmurs, rubs, gallops or clicks. 1+ pedal edema. Gastrointestinal system: Abdomen is nondistended, soft and nontender. No organomegaly or masses felt. Normal bowel sounds Craig. Central nervous system: Alert and oriented. No focal neurological deficits. Extremities: 1+ edema Symmetric 5 x 5 power. Skin: No rashes, lesions or ulcers. Psychiatry: Judgement and insight appear normal. Mood & affect appropriate.  Data Reviewed: I have personally reviewed following labs and imaging studies  CBC: Recent Labs  Lab 10/23/24 0842 10/24/24 0455  WBC 6.0 5.9  NEUTROABS 2.5  --   HGB 13.4 14.3  HCT 40.3 43.8  MCV 90.6 91.4  PLT 369 344    Basic Metabolic Panel: Recent Labs  Lab 10/23/24 0842 10/24/24 0455 10/25/24 0522 10/26/24 0447 10/27/24 0438  NA 141 141 141 139 138  K 3.4* 4.0 3.4* 3.5 3.3*  CL 110 108 106 103 101  CO2 26 27 27 27 27   GLUCOSE 93 90 92 93 95   BUN 9 11 12 13 13   CREATININE 0.74 0.87 0.84 0.91 0.98  CALCIUM 8.0* 8.3* 8.3* 8.5* 8.6*  MG 1.9  --  2.0 2.0 2.0    CBG: Recent Labs  Lab 10/26/24 1204 10/26/24 1628 10/26/24 2131 10/27/24 0744 10/27/24 1155  GLUCAP 96 87 95 99 93    No results found for this or any previous visit (from the past 240 hours).   Radiology Studies: No results found.  Scheduled Meds:  acetaminophen   1,000 mg Oral Q6H   Or   acetaminophen   650 mg Rectal Q6H   ARIPiprazole   10 mg Oral q morning   buPROPion   150 mg Oral q morning   clonazePAM   2 mg Oral QHS   DULoxetine   60 mg Oral q morning   enoxaparin  (LOVENOX ) injection  70 mg Subcutaneous Q24H   fluticasone  furoate-vilanterol  1 puff Inhalation Daily   furosemide   80 mg Intravenous Q12H   loratadine   10 mg Oral Daily   montelukast   10 mg Oral QHS   pantoprazole   40 mg Oral Daily   pregabalin   75 mg Oral Daily   spironolactone   25 mg Oral Daily   traZODone   100 mg Oral QHS   Continuous Infusions:   LOS: 0 days   Time spent: 55 mins  Risha Barretta Vicci, MD How to contact the [facility] Attending or Consulting provider 7A - 7P or covering provider during after hours 7P -7A, for this patient?  Check the care team in [facility] and look for a) attending/consulting TRH provider listed and b) the TRH team listed Log into [email or web address] to find provider on call.  Locate the TRH provider you are looking for under [facility] and page to a number that you can be directly reached. If you still have difficulty reaching the provider, please page the [facility] (Director on Call) for the Hospitalists listed on amion for assistance.  10/27/2024, 1:03 PM

## 2024-10-27 NOTE — Evaluation (Signed)
 Occupational Therapy Evaluation Patient Details Name: Michele Craig MRN: [id number] DOB: Mar 13, 1971 Today's Date: 10/27/2024   History of Present Illness   Michele Craig is a 53 y.o. female with medical history significant of chronic systolic heart failure per echocardiogram 2023, rheumatoid arthritis, obesity, fibromyalgia, headache who presents to the ER with complaints of worsening bilateral lower extremity edema, abdominal wall edema and exertional shortness of breath that has been worsening.  Patient states she has stopped taking Wegovy about a month ago and since then having symptoms of lower extremity edema left more than right.  She also had some cough and cold symptoms around that time.  She was initiated on Lasix  however with no significant improvement.  The dose of Lasix  was recently increased to 40 mg daily.  Patient denies chest pain.   She was diagnosed with congestive heart failure per echocardiogram 2023 with LVEF 40 to 45%, LVH.  Stress test was positive.  She had coronary angiogram in July 2023 showing normal coronaries.  Patient however does not appear to be on GDMT.     Clinical Impressions Pt agreeable to OT evaluation. Pt near baseline function for ADL's and mobility. Pt has shoulder arthritis at baseline but demonstrated WFL B UE functional use. Pt able to ambulate to the toilet without AD. Bed mobility did not require physical assist. Pt left in the bed with call bell within reach. Pt is not recommended for any further acute OT services and will be discharged to care of nursing staff for remaining length of stay.                Functional Status Assessment   Patient has not had a recent decline in their functional status     Equipment Recommendations   None recommended by OT             Precautions/Restrictions   Precautions Precautions: Fall Recall of Precautions/Restrictions: Intact Restrictions Weight Bearing Restrictions Per Provider Order:  No     Mobility Bed Mobility Overal bed mobility: Modified Independent             General bed mobility comments: No physical assist    Transfers Overall transfer level: Modified independent                 General transfer comment: No physical assist for ambulatory transer to toilet and back without AD; Mild limp noted, but not DME needed.      Balance Overall balance assessment: Mild deficits observed, not formally tested                                         ADL either performed or assessed with clinical judgement   ADL Overall ADL's : Independent                                             Vision Baseline Vision/History: 1 Wears glasses Additional Comments: No impairments noted.     Perception Perception: Not tested       Praxis Praxis: Not tested       Pertinent Vitals/Pain Pain Assessment Pain Assessment: Faces Faces Pain Scale: Hurts a little bit Pain Location: L LE with touch or movement Pain Descriptors / Indicators: Discomfort Pain Intervention(s): Monitored during session, Repositioned  Extremity/Trunk Assessment Upper Extremity Assessment Upper Extremity Assessment: Overall WFL for tasks assessed (Pain in L shoulder from arthritis which is typical for the pt.)   Lower Extremity Assessment Lower Extremity Assessment: Defer to PT evaluation   Cervical / Trunk Assessment Cervical / Trunk Assessment: Kyphotic   Communication Communication Communication: No apparent difficulties   Cognition Arousal: Alert Behavior During Therapy: WFL for tasks assessed/performed Cognition: No apparent impairments                               Following commands: Intact       Cueing  General Comments   Cueing Techniques: Verbal cues                 Home Living Family/patient expects to be discharged to:: Private residence Living Arrangements: Spouse/significant  other;Children Available Help at Discharge: Family;Available 24 hours/day Type of Home: House Home Access: Level entry     Home Layout: One level     Bathroom Shower/Tub: Producer, Television/film/video: Handicapped height Bathroom Accessibility: No   Home Equipment: Grab bars - tub/shower;Grab bars - toilet;Shower seat - built in   Additional Comments: per chart      Prior Functioning/Environment Prior Level of Function : Independent/Modified Independent       Physical Assist : ADLs (physical)   ADLs (physical): Dressing Mobility Comments: Pt reports ambulation without AD typically. ADLs Comments: Pt reported independence with this OT. Pt reported PRN assist to physical therapy.                                          AM-PAC OT 6 Clicks Daily Activity     Outcome Measure Help from another person eating meals?: None Help from another person taking care of personal grooming?: None Help from another person toileting, which includes using toliet, bedpan, or urinal?: None Help from another person bathing (including washing, rinsing, drying)?: None Help from another person to put on and taking off regular upper body clothing?: None Help from another person to put on and taking off regular lower body clothing?: None 6 Click Score: 24   End of Session    Activity Tolerance: Patient tolerated treatment well Patient left: in bed;with call bell/phone within reach  OT Visit Diagnosis: Muscle weakness (generalized) (M62.81)                Time: 8576-8571 OT Time Calculation (min): 5 min Charges:  OT General Charges $OT Visit: 1 Visit OT Evaluation $OT Eval Low Complexity: 1 Low  Bayron Dalto OT, MOT  Jayson Person 10/27/2024, 2:56 PM

## 2024-10-27 NOTE — Plan of Care (Signed)
   Problem: Education: Goal: Ability to describe self-care measures that may prevent or decrease complications (Diabetes Survival Skills Education) will improve Outcome: Progressing Goal: Individualized Educational Video(s) Outcome: Progressing   Problem: Coping: Goal: Ability to adjust to condition or change in health will improve Outcome: Progressing

## 2024-10-27 NOTE — TOC Initial Note (Signed)
 Transition of Care [facility]) - Initial/Assessment Note    Patient Details  Name: Michele Craig MRN: [id number] Date of Birth: [birth year]  Transition of Care [facility]) CM/SW Contact:    Lucie Lunger, LCSWA Phone Number: 10/27/2024, 2:00 PM  Clinical Narrative:                 CSW updated that PT is recommending HH PT for pt at D/C. CSW spoke with pt to review, pt states she is agreeable to [facility] being arranged. CSW explained that referral will be sent out locally for review. CSW explained that if there are no agencies to accept then we will need to make OP PT referral, pt is understanding. TOC to follow.   Expected Discharge Plan: Home w Home Health Services Barriers to Discharge: Continued Medical Work up   Patient Goals and CMS Choice Patient states their goals for this hospitalization and ongoing recovery are:: return home CMS Medicare.gov Compare Post Acute Care list provided to:: Patient Choice offered to / list presented to : Patient      Expected Discharge Plan and Services In-house Referral: Clinical Social Work Discharge Planning Services: CM Consult Post Acute Care Choice: Home Health Living arrangements for the past 2 months: Single Family Home                                      Prior Living Arrangements/Services Living arrangements for the past 2 months: Single Family Home Lives with:: Spouse, Relatives Patient language and need for interpreter reviewed:: Yes Do you feel safe going back to the place where you live?: Yes      Need for Family Participation in Patient Care: Yes (Comment) Care giver support system in place?: Yes (comment)   Criminal Activity/Legal Involvement Pertinent to Current Situation/Hospitalization: No - Comment as needed  Activities of Daily Living   ADL Screening (condition at time of admission) Independently performs ADLs?: Yes (appropriate for developmental age) Is the patient deaf or have difficulty hearing?: No Does the patient  have difficulty seeing, even when wearing glasses/contacts?: No Does the patient have difficulty concentrating, remembering, or making decisions?: No  Permission Sought/Granted                  Emotional Assessment Appearance:: Appears stated age Attitude/Demeanor/Rapport: Engaged Affect (typically observed): Accepting Orientation: : Oriented to Self, Oriented to Place, Oriented to  Time, Oriented to Situation Alcohol / Substance Use: Not Applicable Psych Involvement: No (comment)  Admission diagnosis:  Acute on chronic systolic heart failure (HCC) [I50.23] Hypokalemia [E87.6] Acute pulmonary edema (HCC) [J81.0] Peripheral edema [R60.0] Patient Active Problem List   Diagnosis Date Noted   Chronic systolic CHF (congestive heart failure) (HCC) 10/23/2024   Acute on chronic systolic heart failure (HCC) 10/23/2024   Generalized anxiety disorder 01/04/2024   Mixed hyperlipidemia 01/04/2024   Vitamin D  deficiency 01/04/2024   Cervical myelopathy (HCC) 07/08/2023   Cervical spinal stenosis 07/08/2023   Encounter to establish care with new doctor 12/03/2022   Chronic low back pain with bilateral sciatica 12/03/2022   Abnormal nuclear cardiac imaging test 06/05/2022   Acute asthma exacerbation 01/09/2022   GERD (gastroesophageal reflux disease) 01/09/2022   Anxiety 01/09/2022   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   Fibromyalgia 01/09/2022   Migraines 01/09/2022   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   Obesity with alveolar hypoventilation (HCC) 02/01/2019   IUD (intrauterine  device) in place 01/22/2017   Abnormal uterine bleeding (AUB) 01/22/2017   Obstructive sleep apnea 03/08/2013   Morbid obesity (HCC) 04/01/2012   HTN (hypertension) 04/01/2012   DM (diabetes mellitus) (HCC) 04/01/2012   Allergic rhinitis 04/01/2012   PCP:  Kayla Jeoffrey RAMAN, FNP Pharmacy:   [id number] [place], [place] - [address] AT [facility] [address] & [facility]  [place] [address] [place] [address] Phone: [phone] Fax: [phone]  [id number] - [place], [place] - [address] AT [facility] [address] [place] [address] Phone: [phone] Fax: [phone]     Social Drivers of Health (SDOH) Social History: SDOH Screenings   Food Insecurity: No Food Insecurity (10/23/2024)  Housing: Low Risk (10/23/2024)  Transportation Needs: No Transportation Needs (10/23/2024)  Utilities: Not At Risk (10/23/2024)  Depression (PHQ2-9): High Risk (06/11/2023)  Tobacco Use: Low Risk (10/23/2024)   SDOH Interventions:     Readmission Risk Interventions     No data to display

## 2024-10-27 NOTE — Evaluation (Signed)
 Physical Therapy Evaluation Patient Details Name: Michele Craig MRN: [id number] DOB: [birth year] Today's Date: 10/27/2024  History of Present Illness  Michele Craig is a 53 y.o. female with medical history significant of chronic systolic heart failure per echocardiogram 2023, rheumatoid arthritis, obesity, fibromyalgia, headache who presents to the ER with complaints of worsening bilateral lower extremity edema, abdominal wall edema and exertional shortness of breath that has been worsening.  Patient states she has stopped taking Wegovy about a month ago and since then having symptoms of lower extremity edema left more than right.  She also had some cough and cold symptoms around that time.  She was initiated on Lasix  however with no significant improvement.  The dose of Lasix  was recently increased to 40 mg daily.  Patient denies chest pain.   She was diagnosed with congestive heart failure per echocardiogram 2023 with LVEF 40 to 45%, LVH.  Stress test was positive.  She had coronary angiogram in July 2023 showing normal coronaries.  Patient however does not appear to be on GDMT.   Clinical Impression  Patient agreeable to PT evlauation. Patient reports at baseline, she ambulates without an AD but receives intermittent assist for ADLs. This date, patient is CGA for all mobility, transfers, and ambulation. Pt limited most due to R ankle pain, L gastoc pain and impaired balance. Without AD, pt tends to use nearby surfaces for support. Pt's balance improves with QC during ambulation. Encouraged use of SPC or QC once discharged for maximum safety.   Patient tolerates sitting in chair at end of session, call button in reach, all needs met. Patient will benefit from continued skilled physical therapy acutely and in recommended venue in order to address current deficits and improve overall function.         If plan is discharge home, recommend the following: A little help with walking and/or transfers    Can travel by private vehicle        Equipment Recommendations None recommended by PT  Recommendations for Other Services       Functional Status Assessment Patient has had a recent decline in their functional status and demonstrates the ability to make significant improvements in function in a reasonable and predictable amount of time.     Precautions / Restrictions Precautions Precautions: Fall Recall of Precautions/Restrictions: Intact Restrictions Weight Bearing Restrictions Per Provider Order: No      Mobility  Bed Mobility Overal bed mobility: Needs Assistance Bed Mobility: Supine to Sit     Supine to sit: Contact guard     General bed mobility comments: CGA for safety, HOB flat, no physical assist needed, inc time secondary to slow labored movement    Transfers Overall transfer level: Needs assistance Equipment used: None, Quad cane Transfers: Sit to/from Stand, Bed to chair/wheelchair/BSC Sit to Stand: Contact guard assist   Step pivot transfers: Contact guard assist       General transfer comment: first STS without AD, CGA for safety due to very mild unsteadiness, reaches for arm rest of chair during step pivot, no overt LOB, demo slow movement throughout, second STS with QC w/ improved balance    Ambulation/Gait Ambulation/Gait assistance: Contact guard assist Gait Distance (Feet): 40 Feet Assistive device: Quad cane Gait Pattern/deviations: Step-through pattern, Decreased step length - right, Decreased step length - left, Decreased stride length, Drifts right/left Gait velocity: Dec     General Gait Details: Pt ambulates in room first half without AD and pt reaching for walls and  furniture for stability, second half with QC for support with improvements in unsteadiness, dec velocity and slow labored movement throughout  Stairs            Wheelchair Mobility     Tilt Bed    Modified Rankin (Stroke Patients Only)       Balance  Overall balance assessment: Mild deficits observed, not formally tested                                           Pertinent Vitals/Pain Pain Assessment Pain Assessment: 0-10 Pain Score: 9  Pain Location: L calf and R ankle Pain Descriptors / Indicators: Aching, Sharp Pain Intervention(s): Limited activity within patient's tolerance, Repositioned, Monitored during session    Home Living Family/patient expects to be discharged to:: Private residence Living Arrangements: Spouse/significant other;Children Available Help at Discharge: Family;Available 24 hours/day Type of Home: House Home Access: Level entry       Home Layout: One level Home Equipment: Grab bars - tub/shower;Grab bars - toilet;Shower seat - built in      Prior Function Prior Level of Function : Needs assist       Physical Assist : ADLs (physical)   ADLs (physical): Dressing Mobility Comments: Reports no AD use when ambulating, but cant stand for long periods of time, no falls, does not drive ADLs Comments: Reports she asks her husband to assist sometimes with ADLs     Extremity/Trunk Assessment   Upper Extremity Assessment Upper Extremity Assessment: Overall WFL for tasks assessed (shoulder flexion AROM WNL, MMT 4/5 with some pain in shoulders)    Lower Extremity Assessment Lower Extremity Assessment: Generalized weakness;Overall [facility] for tasks assessed (ankle DF MMT 4+/5, hip flexion 4/5, inc pain in LLE during hip flexion MMT)    Cervical / Trunk Assessment Cervical / Trunk Assessment: Kyphotic  Communication   Communication Communication: No apparent difficulties    Cognition Arousal: Alert Behavior During Therapy: WFL for tasks assessed/performed   PT - Cognitive impairments: No apparent impairments                         Following commands: Intact       Cueing Cueing Techniques: Verbal cues, Tactile cues, Visual cues     General Comments      Exercises      Assessment/Plan    PT Assessment Patient needs continued PT services;All further PT needs can be met in the next venue of care  PT Problem List Decreased strength;Decreased activity tolerance;Decreased balance;Pain       PT Treatment Interventions DME instruction;Gait training;Functional mobility training;Therapeutic activities;Therapeutic exercise;Balance training;Patient/family education    PT Goals (Current goals can be found in the Care Plan section)  Acute Rehab PT Goals Patient Stated Goal: return home PT Goal Formulation: With patient Time For Goal Achievement: 10/31/24 Potential to Achieve Goals: Good    Frequency Min 3X/week     Co-evaluation               AM-PAC PT 6 Clicks Mobility  Outcome Measure Help needed turning from your back to your side while in a flat bed without using bedrails?: None Help needed moving from lying on your back to sitting on the side of a flat bed without using bedrails?: None Help needed moving to and from a bed to a chair (including a wheelchair)?: A  Little Help needed standing up from a chair using your arms (e.g., wheelchair or bedside chair)?: A Little Help needed to walk in hospital room?: A Little Help needed climbing 3-5 steps with a railing? : A Little 6 Click Score: 20    End of Session Equipment Utilized During Treatment: Gait belt Activity Tolerance: Patient tolerated treatment well Patient left: in chair;with call bell/phone within reach   PT Visit Diagnosis: Unsteadiness on feet (R26.81);Muscle weakness (generalized) (M62.81);Other abnormalities of gait and mobility (R26.89);Pain Pain - Right/Left:  (both) Pain - part of body: Ankle and joints of foot    Time: 8875-8857 PT Time Calculation (min) (ACUTE ONLY): 18 min   Charges:   PT Evaluation $PT Eval Low Complexity: 1 Low   PT General Charges $$ ACUTE PT VISIT: 1 Visit        1:45 PM, 10/27/2024 Migdalia Olejniczak Powell-Butler, PT, DPT [place] with [facility]

## 2024-10-27 NOTE — Plan of Care (Signed)
°  Problem: Acute Rehab PT Goals(only PT should resolve) Goal: Pt Will Go Supine/Side To Sit Outcome: Progressing Flowsheets (Taken 10/27/2024 1347) Pt will go Supine/Side to Sit: Independently Goal: Patient Will Transfer Sit To/From Stand Outcome: Progressing Flowsheets (Taken 10/27/2024 1347) Patient will transfer sit to/from stand: with modified independence Goal: Pt Will Transfer Bed To Chair/Chair To Bed Outcome: Progressing Flowsheets (Taken 10/27/2024 1347) Pt will Transfer Bed to Chair/Chair to Bed: with modified independence Goal: Pt Will Ambulate Outcome: Progressing Flowsheets (Taken 10/27/2024 1347) Pt will Ambulate:  75 feet  with least restrictive assistive device  with modified independence   1:47 PM, 10/27/2024 Rosaria Settler, PT, DPT [place] with [facility]

## 2024-10-28 DIAGNOSIS — Z9884 Bariatric surgery status: Secondary | ICD-10-CM | POA: Diagnosis not present

## 2024-10-28 DIAGNOSIS — G4733 Obstructive sleep apnea (adult) (pediatric): Secondary | ICD-10-CM | POA: Diagnosis not present

## 2024-10-28 DIAGNOSIS — I5023 Acute on chronic systolic (congestive) heart failure: Secondary | ICD-10-CM | POA: Diagnosis not present

## 2024-10-28 LAB — BASIC METABOLIC PANEL WITH GFR
Anion gap: 7 (ref 5–15)
BUN: 14 mg/dL (ref 6–20)
CO2: 30 mmol/L (ref 22–32)
Calcium: 8.2 mg/dL — ABNORMAL LOW (ref 8.9–10.3)
Chloride: 101 mmol/L (ref 98–111)
Creatinine, Ser: 1.03 mg/dL — ABNORMAL HIGH (ref 0.44–1.00)
GFR, Estimated: >60 mL/min
Glucose, Bld: 85 mg/dL (ref 70–99)
Potassium: 3.2 mmol/L — ABNORMAL LOW (ref 3.5–5.1)
Sodium: 138 mmol/L (ref 135–145)

## 2024-10-28 LAB — GLUCOSE, CAPILLARY
Glucose-Capillary: 97 mg/dL (ref 70–99)
Glucose-Capillary: 112 mg/dL — ABNORMAL HIGH (ref 70–99)

## 2024-10-28 MED ORDER — POTASSIUM CHLORIDE CRYS ER 10 MEQ PO TBCR: 10.0000 meq | EXTENDED_RELEASE_TABLET | Freq: Every day | ORAL | 0 refills | Status: AC

## 2024-10-28 MED ORDER — POTASSIUM CHLORIDE CRYS ER 20 MEQ PO TBCR
40.0000 meq | EXTENDED_RELEASE_TABLET | Freq: Once | ORAL | Status: AC
  Administered 2024-10-28: 40 meq via ORAL
  Filled 2024-10-28: qty 2

## 2024-10-28 MED ORDER — MONTELUKAST SODIUM 10 MG PO TABS: 10.0000 mg | ORAL_TABLET | Freq: Every day | ORAL | 1 refills | Status: DC

## 2024-10-28 MED ORDER — CLONAZEPAM 2 MG PO TABS: 2.0000 mg | ORAL_TABLET | Freq: Every evening | ORAL | 0 refills | Status: AC | PRN

## 2024-10-28 MED ORDER — TORSEMIDE 20 MG PO TABS: 40.0000 mg | ORAL_TABLET | Freq: Every day | ORAL | 1 refills | Status: AC

## 2024-10-28 MED ORDER — DULOXETINE HCL 60 MG PO CPEP: 60.0000 mg | ORAL_CAPSULE | Freq: Every morning | ORAL | 0 refills | Status: AC

## 2024-10-28 MED ORDER — SPIRONOLACTONE 25 MG PO TABS: 25.0000 mg | ORAL_TABLET | Freq: Every day | ORAL | 1 refills | Status: AC

## 2024-10-28 MED ORDER — ACETAMINOPHEN 500 MG PO TABS: 1000.0000 mg | ORAL_TABLET | Freq: Three times a day (TID) | ORAL | Status: AC

## 2024-10-28 NOTE — TOC Transition Note (Signed)
 Transition of Care [facility]) - Discharge Note   Patient Details  Name: Michele Craig MRN: [id number] Date of Birth: 11/15/1970  Transition of Care [facility]) CM/SW Contact:  Lucie Lunger, LCSWA Phone Number: 10/28/2024, 9:49 AM   Clinical Narrative:    CSW updated that [facility] able [facility] pt for [facility] PT services. CSW requested that [facility] PT orders. CSW updated Shelia with [facility] that pt will D/C home today. HH will follow up with pt in community to arrange home therapy visits. TOC signing off.   Final next level of care: Home w Home Health Services Barriers to Discharge: Barriers Resolved   Patient Goals and CMS Choice Patient states their goals for this hospitalization and ongoing recovery are:: return home CMS Medicare.gov Compare Post Acute Care list provided to:: Patient Choice offered to / list presented to : Patient      Discharge Placement                       Discharge Plan and Services Additional resources added to the After Visit Summary for   In-house Referral: Clinical Social Work Discharge Planning Services: CM Consult Post Acute Care Choice: Home Health                    HH Arranged: PT [facility] Agency: [facility] Date [facility] Agency Contacted: 10/28/24   Representative spoke with at [facility] Agency: Hol[place]  Social Drivers of Health (SDOH) Interventions SDOH Screenings   Food Insecurity: No Food Insecurity (10/23/2024)  Housing: Low Risk (10/23/2024)  Transportation Needs: No Transportation Needs (10/23/2024)  Utilities: Not At Risk (10/23/2024)  Depression (PHQ2-9): High Risk (06/11/2023)  Tobacco Use: Low Risk (10/23/2024)     Readmission Risk Interventions     No data to display

## 2024-10-28 NOTE — Discharge Instructions (Signed)
 IMPORTANT INFORMATION: PAY CLOSE ATTENTION   PHYSICIAN DISCHARGE INSTRUCTIONS  Follow with Primary care provider  Michele Meo, FNP  and other consultants as instructed by your Hospitalist Physician  SEEK MEDICAL CARE OR RETURN TO EMERGENCY ROOM IF SYMPTOMS COME BACK, WORSEN OR NEW PROBLEM DEVELOPS   Please note: You were cared for by a hospitalist during your hospital stay. Every effort will be made to forward records to your primary care provider.  You can request that your primary care provider send for your hospital records if they have not received them.  Once you are discharged, your primary care physician will handle any further medical issues. Please note that NO REFILLS for any discharge medications will be authorized once you are discharged, as it is imperative that you return to your primary care physician (or establish a relationship with a primary care physician if you do not have one) for your post hospital discharge needs so that they can reassess your need for medications and monitor your lab values.  Please get a complete blood count and chemistry panel checked by your Primary MD at your next visit, and again as instructed by your Primary MD.  Get Medicines reviewed and adjusted: Please take all your medications with you for your next visit with your Primary MD  Laboratory/radiological data: Please request your Primary MD to go over all hospital tests and procedure/radiological results at the follow up, please ask your primary care provider to get all Hospital records sent to his/her office.  In some cases, they will be blood work, cultures and biopsy results pending at the time of your discharge. Please request that your primary care provider follow up on these results.  If you are diabetic, please bring your blood sugar readings with you to your follow up appointment with primary care.    Please call and make your follow up appointments as soon as possible.    Also Note  the following: If you experience worsening of your admission symptoms, develop shortness of breath, life threatening emergency, suicidal or homicidal thoughts you must seek medical attention immediately by calling 911 or calling your MD immediately  if symptoms less severe.  You must read complete instructions/literature along with all the possible adverse reactions/side effects for all the Medicines you take and that have been prescribed to you. Take any new Medicines after you have completely understood and accpet all the possible adverse reactions/side effects.   Do not drive when taking Pain medications or sleeping medications (Benzodiazepines)  Do not take more than prescribed Pain, Sleep and Anxiety Medications. It is not advisable to combine anxiety,sleep and pain medications without talking with your primary care practitioner  Special Instructions: If you have smoked or chewed Tobacco  in the last 2 yrs please stop smoking, stop any regular Alcohol  and or any Recreational drug use.  Wear Seat belts while driving.  Do not drive if taking any narcotic, mind altering or controlled substances or recreational drugs or alcohol.

## 2024-10-28 NOTE — Discharge Summary (Signed)
 Physician Discharge Summary  Michele Craig FMW:[id number] DOB: 1971/04/08 DOA: 10/23/2024  PCP: Kayla Jeoffrey RAMAN, FNP  Admit date: 10/23/2024 Discharge date: 10/28/2024  Admitted From:  Home  Disposition: Home with [facility]   Recommendations for Outpatient Follow-up:  Follow up with PCP in 1-2 weeks Follow up with cardiology as scheduled 11/01/24 Please obtain BMP in 1 week  Home Health:  PT   Discharge Condition: STABLE   CODE STATUS: FULL DIET: Heart Healthy, 2 gram sodium restriction, fluid restriction 2 liters per day    Brief Hospitalization Summary: Please see all hospital notes, images, labs for full details of the hospitalization. Admission provider HPI:  53 y.o. female with medical history significant of chronic systolic heart failure per echocardiogram 2023, rheumatoid arthritis, obesity, fibromyalgia, headache who presents to the ER with complaints of worsening bilateral lower extremity edema, abdominal wall edema and exertional shortness of breath that has been worsening.  She has been admitted for acute on chronic systolic heart failure and has been started on IV Lasix  for diuresis.   Hospital Course by listed problems addressed  Acute on chronic diastolic heart failure/hypervolemia 2D echocardiogram with preserved LVEF and indeterminate diastolic values on 12/15 Patient not on GDMT at admission  Started spironolactone  25 mg daily  SGLTi not started due to cost  Pt was treated with IV Lasix  80 mg twice daily with good results.  Baseline weight around 300 pounds and currently down to 301#. Transitioned to oral torsemide  40 mg once daily with potassium supplement Kdur 10 meq daily.  Pt has appt to see cardiology on 11/01/24 and reported she has PCP appt on 11/01/24.   Filed Weights   10/26/24 0425 10/27/24 0404 10/28/24 0541  Weight: (!) 138.2 kg (!) 137.8 kg (!) 136.7 kg    Intake/Output Summary (Last 24 hours) at 10/28/2024 1008 Last data filed at 10/28/2024 0500 Gross  per 24 hour  Intake 540 ml  Output 2050 ml  Net -1510 ml    Mild hypokalemia Related to diuresis, repleted Added spironolactone  25 mg daily on 12/17 Daily Kdur 10 meq ordered    Migraine headaches Sumatriptan  ordered   Bilateral lower extremity edema and pain  Left more than right, Doppler is negative for DVT Will continue diuresis as above. PT for ambulation -- HHPT ordered    History of rheumatoid arthritis - stable    History of obesity, gastric bypass/OSA   History of COPD Resume home medications No s/s of acute exacerbation    Discharge Diagnoses:  Principal Problem:   Acute on chronic systolic heart failure (HCC) Active Problems:   Morbid obesity (HCC)   DM (diabetes mellitus) (HCC)   Obstructive sleep apnea   Status post bariatric surgery   Chronic systolic CHF (congestive heart failure) (HCC)   Discharge Instructions:  Allergies as of 10/28/2024       Reactions   Penicillins Hives, Itching, Dermatitis, Rash   Shellfish Allergy Anaphylaxis, Other (See Comments)   Crawdads   Cefadroxil  Hives, Diarrhea, Rash, Other (See Comments)   Abdominal pain   Clindamycin Hcl Other (See Comments)   Unknown    Zosyn  [piperacillin  Sod-tazobactam So] Hives, Itching   Clindamycin Itching, Nausea Only, Rash, Other (See Comments)   Stomach pains        Medication List     PAUSE taking these medications    Wegovy 1.7 MG/0.75ML Soaj SQ injection Wait to take this until your doctor or other care provider tells you to start again. Generic drug: semaglutide-weight  management Inject 1.7 mg into the skin once a week.       STOP taking these medications    benzonatate 100 MG capsule Commonly known as: TESSALON   celecoxib  100 MG capsule Commonly known as: CELEBREX    furosemide  20 MG tablet Commonly known as: LASIX    Lyrica  75 MG capsule Generic drug: pregabalin    predniSONE  20 MG tablet Commonly known as: DELTASONE        TAKE these medications     acetaminophen  500 MG tablet Commonly known as: TYLENOL  Take 2 tablets (1,000 mg total) by mouth 3 (three) times daily.   Advair  Diskus 250-50 MCG/DOSE Aepb Generic drug: fluticasone -salmeterol Inhale 1 puff into the lungs 2 (two) times daily.   albuterol  108 (90 Base) MCG/ACT inhaler Commonly known as: VENTOLIN  HFA Inhale 2 puffs into the lungs every 4 (four) hours as needed for wheezing or shortness of breath.   ARIPiprazole  10 MG tablet Commonly known as: ABILIFY  Take 10 mg by mouth every morning.   buPROPion  150 MG 24 hr tablet Commonly known as: WELLBUTRIN  XL Take 150 mg by mouth every morning.   buPROPion  300 MG 24 hr tablet Commonly known as: WELLBUTRIN  XL Take 300 mg by mouth every morning.   clonazePAM  2 MG tablet Commonly known as: KLONOPIN  Take 1 tablet (2 mg total) by mouth at bedtime as needed for anxiety. What changed:  when to take this reasons to take this   DULoxetine  60 MG capsule Commonly known as: CYMBALTA  Take 1 capsule (60 mg total) by mouth every morning.   fluticasone  44 MCG/ACT inhaler Commonly known as: FLOVENT  HFA Inhale 1 puff into the lungs daily as needed (for shortness of breath).   fluticasone  50 MCG/ACT nasal spray Commonly known as: FLONASE  Place 1 spray into both nostrils at bedtime.   gabapentin  100 MG capsule Commonly known as: NEURONTIN  Take 400 mg by mouth 2 (two) times daily.   levocetirizine 5 MG tablet Commonly known as: XYZAL  Take 5 mg by mouth every evening.   methocarbamol  500 MG tablet Commonly known as: ROBAXIN  Take 500 mg by mouth 3 (three) times daily as needed for muscle spasms.   Mirena  (52 MG) 20 MCG/DAY Iud Generic drug: levonorgestrel  Mirena  20 mcg/24 hours (7 yrs) 52 mg intrauterine device  Take 1 device by intrauterine route.   montelukast  10 MG tablet Commonly known as: SINGULAIR  Take 1 tablet (10 mg total) by mouth at bedtime.   omeprazole 20 MG capsule Commonly known as: PRILOSEC Take 20 mg  by mouth every evening.   potassium chloride  10 MEQ tablet Commonly known as: KLOR-CON  M Take 1 tablet (10 mEq total) by mouth daily.   spironolactone  25 MG tablet Commonly known as: ALDACTONE  Take 1 tablet (25 mg total) by mouth daily.   SUMAtriptan  100 MG tablet Commonly known as: IMITREX  Take 100 mg by mouth every 2 (two) hours as needed for migraine.   torsemide  20 MG tablet Commonly known as: DEMADEX  Take 2 tablets (40 mg total) by mouth daily. Start taking on: October 29, 2024   traZODone  100 MG tablet Commonly known as: DESYREL  Take 100 mg by mouth at bedtime.   Vitamin D  (Ergocalciferol ) 1.25 MG (50000 UNIT) Caps capsule Commonly known as: DRISDOL  Take 50,000 Units by mouth every Sunday.        Contact information for follow-up providers     [facility]. Go on 11/01/2024.   Specialty: Cardiology Why: Hospital Follow-Up 11/01/24 @ 2:45 PM  Please bring all medications with you to follow-up appointment [facility], Entrance [address] off of [address] [place] Parking at the door or use [employer] Code [phone] to park under the building. Contact information: [address] [place]   [address] [phone]        Kayla Jeoffrey RAMAN, FNP. Schedule an appointment as soon as possible for a visit in 1 week(s).   Specialty: Family Medicine Why: Hospital Follow Up Contact information: [address] [place] [address] [phone]              Contact information for after-discharge care     Home Medical Care     [facility]) .   Service: Home Health Services Contact information: [address] [place]  [phone]                    Allergies[1] Allergies as of 10/28/2024       Reactions   Penicillins Hives, Itching, Dermatitis, Rash   Shellfish Allergy Anaphylaxis, Other (See Comments)   Crawdads   Cefadroxil  Hives,  Diarrhea, Rash, Other (See Comments)   Abdominal pain   Clindamycin Hcl Other (See Comments)   Unknown    Zosyn  [piperacillin  Sod-tazobactam So] Hives, Itching   Clindamycin Itching, Nausea Only, Rash, Other (See Comments)   Stomach pains        Medication List     PAUSE taking these medications    Wegovy 1.7 MG/0.75ML Soaj SQ injection Wait to take this until your doctor or other care provider tells you to start again. Generic drug: semaglutide-weight management Inject 1.7 mg into the skin once a week.       STOP taking these medications    benzonatate 100 MG capsule Commonly known as: TESSALON   celecoxib  100 MG capsule Commonly known as: CELEBREX    furosemide  20 MG tablet Commonly known as: LASIX    Lyrica  75 MG capsule Generic drug: pregabalin    predniSONE  20 MG tablet Commonly known as: DELTASONE        TAKE these medications    acetaminophen  500 MG tablet Commonly known as: TYLENOL  Take 2 tablets (1,000 mg total) by mouth 3 (three) times daily.   Advair  Diskus 250-50 MCG/DOSE Aepb Generic drug: fluticasone -salmeterol Inhale 1 puff into the lungs 2 (two) times daily.   albuterol  108 (90 Base) MCG/ACT inhaler Commonly known as: VENTOLIN  HFA Inhale 2 puffs into the lungs every 4 (four) hours as needed for wheezing or shortness of breath.   ARIPiprazole  10 MG tablet Commonly known as: ABILIFY  Take 10 mg by mouth every morning.   buPROPion  150 MG 24 hr tablet Commonly known as: WELLBUTRIN  XL Take 150 mg by mouth every morning.   buPROPion  300 MG 24 hr tablet Commonly known as: WELLBUTRIN  XL Take 300 mg by mouth every morning.   clonazePAM  2 MG tablet Commonly known as: KLONOPIN  Take 1 tablet (2 mg total) by mouth at bedtime as needed for anxiety. What changed:  when to take this reasons to take this   DULoxetine  60 MG capsule Commonly known as: CYMBALTA  Take 1 capsule (60 mg total) by mouth every morning.   fluticasone  44 MCG/ACT  inhaler Commonly known as: FLOVENT  HFA Inhale 1 puff into the lungs daily as needed (for shortness of breath).   fluticasone  50 MCG/ACT nasal spray Commonly known as: FLONASE  Place 1 spray into both nostrils at bedtime.   gabapentin  100 MG capsule Commonly  known as: NEURONTIN  Take 400 mg by mouth 2 (two) times daily.   levocetirizine 5 MG tablet Commonly known as: XYZAL  Take 5 mg by mouth every evening.   methocarbamol  500 MG tablet Commonly known as: ROBAXIN  Take 500 mg by mouth 3 (three) times daily as needed for muscle spasms.   Mirena  (52 MG) 20 MCG/DAY Iud Generic drug: levonorgestrel  Mirena  20 mcg/24 hours (7 yrs) 52 mg intrauterine device  Take 1 device by intrauterine route.   montelukast  10 MG tablet Commonly known as: SINGULAIR  Take 1 tablet (10 mg total) by mouth at bedtime.   omeprazole 20 MG capsule Commonly known as: PRILOSEC Take 20 mg by mouth every evening.   potassium chloride  10 MEQ tablet Commonly known as: KLOR-CON  M Take 1 tablet (10 mEq total) by mouth daily.   spironolactone  25 MG tablet Commonly known as: ALDACTONE  Take 1 tablet (25 mg total) by mouth daily.   SUMAtriptan  100 MG tablet Commonly known as: IMITREX  Take 100 mg by mouth every 2 (two) hours as needed for migraine.   torsemide  20 MG tablet Commonly known as: DEMADEX  Take 2 tablets (40 mg total) by mouth daily. Start taking on: October 29, 2024   traZODone  100 MG tablet Commonly known as: DESYREL  Take 100 mg by mouth at bedtime.   Vitamin D  (Ergocalciferol ) 1.25 MG (50000 UNIT) Caps capsule Commonly known as: DRISDOL  Take 50,000 Units by mouth every Sunday.        Procedures/Studies: ECHOCARDIOGRAM COMPLETE Result Date: 10/24/2024    ECHOCARDIOGRAM REPORT   Patient Name:   Michele Craig Date of Exam: 10/24/2024 Medical Rec #:  [id number]      Height:       67.0 in Accession #:    [id number]     Weight:       310.6 lb Date of Birth:  03/11/1971      BSA:          2.438  m Patient Age:    53 years       BP:           0/0 mmHg Patient Gender: F              HR:           72  bpm. Exam Location:  [facility] Procedure: 2D Echo, Cardiac Doppler, Color Doppler and Intracardiac            Opacification Agent (Both Spectral and Color Flow Doppler were            utilized during procedure). Indications:    Congestive Heart Failure  History:        Patient has prior history of Echocardiogram examinations, most                 recent 05/28/2022. CHF, Signs/Symptoms:Edema; Risk                 Factors:Diabetes, Sleep Apnea, Hypertension and Dyslipidemia.  Sonographer:    Merlynn Argyle Referring Phys: [id number] [facility] SIGDEL  Sonographer Comments: Suboptimal subcostal window. Image acquisition challenging due to patient body habitus. IMPRESSIONS  1. Left ventricular ejection fraction, by estimation, is 50 to 55%. The left ventricle has low normal function. The left ventricle has no regional wall motion abnormalities. There is mild asymmetric left ventricular hypertrophy of the basal-septal segment. Left ventricular diastolic parameters are indeterminate.  2. Right ventricular systolic function is normal. The right ventricular size is normal.  3. The mitral valve is normal  in structure. No evidence of mitral valve regurgitation. No evidence of mitral stenosis.  4. The aortic valve is tricuspid. Aortic valve regurgitation is not visualized. No aortic stenosis is present.  5. The inferior vena cava is normal in size with greater than 50% respiratory variability, suggesting right atrial pressure of 3 mmHg. FINDINGS  Left Ventricle: Left ventricular ejection fraction, by estimation, is 50 to 55%. The left ventricle has low normal function. The left ventricle has no regional wall motion abnormalities. Definity  contrast agent was given IV to delineate the left ventricular endocardial borders. The left ventricular internal cavity size was normal in size. There is mild asymmetric left ventricular hypertrophy  of the basal-septal segment. Left ventricular diastolic parameters are indeterminate. Right Ventricle: The right ventricular size is normal. Right vetricular wall thickness was not well visualized. Right ventricular systolic function is normal. Left Atrium: Left atrial size was normal in size. Right Atrium: Right atrial size was normal in size. Pericardium: There is no evidence of pericardial effusion. Mitral Valve: The mitral valve is normal in structure. No evidence of mitral valve regurgitation. No evidence of mitral valve stenosis. Tricuspid Valve: The tricuspid valve is normal in structure. Tricuspid valve regurgitation is not demonstrated. No evidence of tricuspid stenosis. Aortic Valve: The aortic valve is tricuspid. Aortic valve regurgitation is not visualized. No aortic stenosis is present. Aortic valve mean gradient measures 3.5 mmHg. Aortic valve peak gradient measures 7.8 mmHg. Aortic valve area, by VTI measures 2.58 cm. Pulmonic Valve: The pulmonic valve was not well visualized. Pulmonic valve regurgitation is mild. No evidence of pulmonic stenosis. Aorta: The aortic root and ascending aorta are structurally normal, with no evidence of dilitation. Venous: The inferior vena cava is normal in size with greater than 50% respiratory variability, suggesting right atrial pressure of 3 mmHg. IAS/Shunts: The interatrial septum was not well visualized.  LEFT VENTRICLE PLAX 2D LVIDd:         5.50 cm   Diastology LVIDs:         2.95 cm   LV e' medial:    7.62 cm/s LV PW:         1.00 cm   LV E/e' medial:  15.7 LV IVS:        0.90 cm   LV e' lateral:   6.00 cm/s LVOT diam:     2.10 cm   LV E/e' lateral: 20.0 LV SV:         58 LV SV Index:   24 LVOT Area:     3.46 cm  RIGHT VENTRICLE             IVC RV S prime:     13.90 cm/s  IVC diam: 2.10 cm TAPSE (M-mode): 2.0 cm LEFT ATRIUM             Index        RIGHT ATRIUM           Index LA diam:        4.60 cm 1.89 cm/m   RA Area:     15.70 cm LA Vol (A2C):   43.8 ml  17.96 ml/m  RA Volume:   39.40 ml  16.16 ml/m LA Vol (A4C):   52.6 ml 21.57 ml/m LA Biplane Vol: 50.4 ml 20.67 ml/m  AORTIC VALVE                    PULMONIC VALVE AV Area (Vmax):    1.95 cm  PR End Diast Vel: 4.08 msec AV Area (Vmean):   2.21 cm     RVOT Peak grad:   2 mmHg AV Area (VTI):     2.58 cm AV Vmax:           139.80 cm/s AV Vmean:          84.253 cm/s AV VTI:            0.225 m AV Peak Grad:      7.8 mmHg AV Mean Grad:      3.5 mmHg LVOT Vmax:         78.60 cm/s LVOT Vmean:        53.800 cm/s LVOT VTI:          0.168 m LVOT/AV VTI ratio: 0.75  AORTA Ao Root diam: 3.00 cm Ao Asc diam:  2.90 cm MITRAL VALVE MV Area (PHT): 3.66 cm     SHUNTS MV Decel Time: 207 msec     Systemic VTI:  0.17 m MV E velocity: 120.00 cm/s  Systemic Diam: 2.10 cm MV A velocity: 78.40 cm/s   Pulmonic VTI:  0.130 m MV E/A ratio:  1.53 Dorn Ross MD Electronically signed by Dorn Ross MD Signature Date/Time: 10/24/2024/1:57:45 PM    Final    US  Venous Img Lower Bilateral (DVT) Result Date: 10/23/2024 EXAM: ULTRASOUND DUPLEX OF THE BILATERAL LOWER EXTREMITY VEINS TECHNIQUE: Duplex ultrasound using B-mode/gray scaled imaging and Doppler spectral analysis and color flow was obtained of the deep venous structures of the bilateral lower extremity. COMPARISON: None available. CLINICAL HISTORY: 53 year old female with bilateral leg edema. FINDINGS: LEFT: The common femoral vein, femoral vein, popliteal vein, and visible calf veins demonstrate normal compressibility with normal color flow and spectral analysis. Visible greater saphenous vein appears patent. RIGHT: The common femoral vein, femoral vein, popliteal vein, and visible calf veins demonstrate normal compressibility with normal color flow and spectral analysis. Visible greater saphenous vein appears patent. OTHER: Calf subcutaneous edema is evident, more pronounced on the left (image 66). IMPRESSION: 1. No evidence of deep venous thrombosis in the bilateral  lower extremities. 2. Bilateral calf subcutaneous edema. Electronically signed by: Helayne Hurst MD 10/23/2024 10:47 AM EST RP Workstation: [id number]   DG Chest Port 1 View Result Date: 10/23/2024 CLINICAL DATA:  Edema. EXAM: PORTABLE CHEST 1 VIEW COMPARISON:  08/08/2023 FINDINGS: Low lung volumes. Cardiopericardial silhouette is at upper limits of normal for size. There is pulmonary vascular congestion without overt pulmonary edema. No focal consolidation or pleural effusion. Telemetry leads overlie the chest. IMPRESSION: Low volume film with pulmonary vascular congestion. Electronically Signed   By: Camellia Candle M.D.   On: 10/23/2024 09:44     Subjective: Pt reports feeling a lot better since weight loss and denies SOB and leg pain is better today on scheduled acetaminophen   Discharge Exam: Vitals:   10/28/24 0541 10/28/24 0902  BP: 112/72   Pulse: 86   Resp: 20   Temp: 98.2 F (36.8 C)   SpO2: 99% 95%   Vitals:   10/27/24 1335 10/27/24 2001 10/28/24 0541 10/28/24 0902  BP: 128/79 129/78 112/72   Pulse: 96 86 86   Resp: 17 18 20    Temp: 98.2 F (36.8 C) 98 F (36.7 C) 98.2 F (36.8 C)   TempSrc:  Oral Oral   SpO2: 100% 98% 99% 95%  Weight:   (!) 136.7 kg   Height:       General exam: morbidly obese female, awake, alert,  cooperative, Appears calm and comfortable  Respiratory system: Clear to auscultation. Respiratory effort normal. Cardiovascular system: normal S1 & S2 heard. No JVD, murmurs, rubs, gallops or clicks. trace pedal edema. Gastrointestinal system: Abdomen is nondistended, soft and nontender. No organomegaly or masses felt. Normal bowel sounds heard. Central nervous system: Alert and oriented. No focal neurological deficits. Extremities: trace edema, Symmetric 5 x 5 power. Skin: No rashes, lesions or ulcers. Psychiatry: Judgement and insight appear normal. Mood & affect appropriate.    The results of significant diagnostics from this hospitalization (including  imaging, microbiology, ancillary and laboratory) are listed below for reference.     Microbiology: No results found for this or any previous visit (from the past 240 hours).   Labs: BNP (last 3 results) No results for input(s): BNP in the last 8760 hours. Basic Metabolic Panel: Recent Labs  Lab 10/23/24 0842 10/24/24 0455 10/25/24 0522 10/26/24 0447 10/27/24 0438 10/28/24 0438  NA 141 141 141 139 138 138  K 3.4* 4.0 3.4* 3.5 3.3* 3.2*  CL 110 108 106 103 101 101  CO2 26 27 27 27 27 30   GLUCOSE 93 90 92 93 95 85  BUN 9 11 12 13 13 14   CREATININE 0.74 0.87 0.84 0.91 0.98 1.03*  CALCIUM 8.0* 8.3* 8.3* 8.5* 8.6* 8.2*  MG 1.9  --  2.0 2.0 2.0  --    Liver Function Tests: Recent Labs  Lab 10/23/24 0842  AST 20  ALT 20  ALKPHOS 146*  BILITOT 0.3  PROT 6.0*  ALBUMIN 3.7   No results for input(s): LIPASE, AMYLASE in the last 168 hours. No results for input(s): AMMONIA in the last 168 hours. CBC: Recent Labs  Lab 10/23/24 0842 10/24/24 0455  WBC 6.0 5.9  NEUTROABS 2.5  --   HGB 13.4 14.3  HCT 40.3 43.8  MCV 90.6 91.4  PLT 369 344   Cardiac Enzymes: No results for input(s): CKTOTAL, CKMB, CKMBINDEX, TROPONINI in the last 168 hours. BNP: Invalid input(s): POCBNP CBG: Recent Labs  Lab 10/27/24 0744 10/27/24 1155 10/27/24 1645 10/27/24 2005 10/28/24 0739  GLUCAP 99 93 109* 107* 97   D-Dimer No results for input(s): DDIMER in the last 72 hours. Hgb A1c No results for input(s): HGBA1C in the last 72 hours. Lipid Profile No results for input(s): CHOL, HDL, LDLCALC, TRIG, CHOLHDL, LDLDIRECT in the last 72 hours. Thyroid function studies No results for input(s): TSH, T4TOTAL, T3FREE, THYROIDAB in the last 72 hours.  Invalid input(s): FREET3 Anemia work up No results for input(s): VITAMINB12, FOLATE, FERRITIN, TIBC, IRON, RETICCTPCT in the last 72 hours. Urinalysis    Component Value Date/Time    COLORURINE YELLOW 10/23/2024 0903   APPEARANCEUR CLEAR 10/23/2024 0903   LABSPEC 1.017 10/23/2024 0903   PHURINE 5.0 10/23/2024 0903   GLUCOSEU NEGATIVE 10/23/2024 0903   HGBUR NEGATIVE 10/23/2024 0903   BILIRUBINUR NEGATIVE 10/23/2024 0903   KETONESUR NEGATIVE 10/23/2024 0903   PROTEINUR NEGATIVE 10/23/2024 0903   UROBILINOGEN 0.2 09/20/2012 1958   NITRITE NEGATIVE 10/23/2024 0903   LEUKOCYTESUR NEGATIVE 10/23/2024 0903   Sepsis Labs Recent Labs  Lab 10/23/24 0842 10/24/24 0455  WBC 6.0 5.9   Microbiology No results found for this or any previous visit (from the past 240 hours).  Time coordinating discharge: 38 mins  SIGNED:  Afton Louder, MD  [facility] 10/28/2024, 10:04 AM How to contact the [facility] Attending or Consulting provider 7A - 7P or covering provider during after hours 7P -7A, for this patient?  Check the care team in [facility] and look for a) attending/consulting [facility] provider listed and b) the [facility] team listed Log into [email or web address] and use [facility] universal password to access. If you do not have the password, please contact the hospital operator. Locate the [facility] provider you are looking for under [facility] and page to a number that you can be directly reached. If you still have difficulty reaching the provider, please page the [facility] (Director on Call) for the Hospitalists listed on amion for assistance.     [1]  Allergies Allergen Reactions   Penicillins Hives, Itching, Dermatitis and Rash   Shellfish Allergy Anaphylaxis and Other (See Comments)    Crawdads   Cefadroxil  Hives, Diarrhea, Rash and Other (See Comments)    Abdominal pain   Clindamycin Hcl Other (See Comments)    Unknown    Zosyn  [Piperacillin  Sod-Tazobactam So] Hives and Itching   Clindamycin Itching, Nausea Only, Rash and Other (See Comments)    Stomach pains

## 2024-10-30 ENCOUNTER — Encounter (HOSPITAL_COMMUNITY): Payer: Self-pay | Admitting: Emergency Medicine

## 2024-10-30 ENCOUNTER — Emergency Department (HOSPITAL_COMMUNITY)
Admission: EM | Admit: 2024-10-30 | Discharge: 2024-10-30 | Disposition: A | Discharge status: Home / Self Care | Attending: Emergency Medicine | Admitting: Emergency Medicine

## 2024-10-30 ENCOUNTER — Emergency Department (HOSPITAL_COMMUNITY)

## 2024-10-30 ENCOUNTER — Other Ambulatory Visit: Payer: Self-pay

## 2024-10-30 DIAGNOSIS — J069 Acute upper respiratory infection, unspecified: Secondary | ICD-10-CM | POA: Diagnosis not present

## 2024-10-30 DIAGNOSIS — R059 Cough, unspecified: Secondary | ICD-10-CM | POA: Diagnosis not present

## 2024-10-30 DIAGNOSIS — R0602 Shortness of breath: Secondary | ICD-10-CM | POA: Diagnosis present

## 2024-10-30 DIAGNOSIS — I509 Heart failure, unspecified: Secondary | ICD-10-CM | POA: Diagnosis not present

## 2024-10-30 DIAGNOSIS — R053 Chronic cough: Secondary | ICD-10-CM

## 2024-10-30 LAB — CBC
HCT: 41.4 % (ref 36.0–46.0)
Hemoglobin: 13.6 g/dL (ref 12.0–15.0)
MCH: 30 pg (ref 26.0–34.0)
MCHC: 32.9 g/dL (ref 30.0–36.0)
MCV: 91.4 fL (ref 80.0–100.0)
Platelets: 394 K/uL (ref 150–400)
RBC: 4.53 MIL/uL (ref 3.87–5.11)
RDW: 12.5 % (ref 11.5–15.5)
WBC: 8.1 K/uL (ref 4.0–10.5)
nRBC: 0 % (ref 0.0–0.2)

## 2024-10-30 LAB — RESP PANEL BY RT-PCR (RSV, FLU A&B, COVID)  RVPGX2
Influenza A by PCR: NEGATIVE
Influenza B by PCR: NEGATIVE
Resp Syncytial Virus by PCR: NEGATIVE
SARS Coronavirus 2 by RT PCR: NEGATIVE

## 2024-10-30 LAB — BASIC METABOLIC PANEL WITH GFR
Anion gap: 9 (ref 5–15)
BUN: 16 mg/dL (ref 6–20)
CO2: 25 mmol/L (ref 22–32)
Calcium: 8 mg/dL — ABNORMAL LOW (ref 8.9–10.3)
Chloride: 106 mmol/L (ref 98–111)
Creatinine, Ser: 0.99 mg/dL (ref 0.44–1.00)
GFR, Estimated: >60 mL/min
Glucose, Bld: 114 mg/dL — ABNORMAL HIGH (ref 70–99)
Potassium: 3.4 mmol/L — ABNORMAL LOW (ref 3.5–5.1)
Sodium: 140 mmol/L (ref 135–145)

## 2024-10-30 LAB — TROPONIN T, HIGH SENSITIVITY: Troponin T High Sensitivity: <15 ng/L (ref 0–19)

## 2024-10-30 LAB — PRO BRAIN NATRIURETIC PEPTIDE: Pro Brain Natriuretic Peptide: <50 pg/mL

## 2024-10-30 MED ORDER — IOHEXOL 350 MG/ML SOLN
100.0000 mL | Freq: Once | INTRAVENOUS | Status: AC | PRN
  Administered 2024-10-30: 100 mL via INTRAVENOUS

## 2024-10-30 MED ORDER — POTASSIUM CHLORIDE CRYS ER 20 MEQ PO TBCR
40.0000 meq | EXTENDED_RELEASE_TABLET | Freq: Once | ORAL | Status: AC
  Administered 2024-10-30: 40 meq via ORAL
  Filled 2024-10-30: qty 2

## 2024-10-30 MED ORDER — GUAIFENESIN-CODEINE 100-10 MG/5ML PO SOLN: 5.0000 mL | Freq: Three times a day (TID) | ORAL | 0 refills | Status: DC | PRN

## 2024-10-30 MED ORDER — GUAIFENESIN-CODEINE 100-10 MG/5ML PO SOLN: 5.0000 mL | Freq: Three times a day (TID) | ORAL | 0 refills | Status: AC | PRN

## 2024-10-30 MED ORDER — IOHEXOL 350 MG/ML SOLN: 100.0000 mL | Freq: Once | INTRAVENOUS | Status: DC | PRN

## 2024-10-30 NOTE — Discharge Instructions (Signed)
 You were seen for your upper respiratory tract infection in the emergency department.   At home, please use Tylenol  for your muscle aches and fevers.  Please use over-the-counter cough medication or tea with honey for your cough.  Follow-up with your primary doctor in 2-3 days regarding your visit.  This may be over the phone.  Return immediately to the emergency department if you experience any of the following: Difficulty breathing, or any other concerning symptoms.    Thank you for visiting our Emergency Department. It was a pleasure taking care of you today.

## 2024-10-30 NOTE — ED Triage Notes (Signed)
 Pt arrived to ED c/o increased SHOB since being discharged from inpatient on Friday. Pt states she was dx with CHF and they removed almost 10lb of fluid during her 1 week hospitalization.

## 2024-10-30 NOTE — ED Provider Notes (Addendum)
 " [place] EMERGENCY DEPARTMENT AT [facility] Provider Note   CSN: [id number] Arrival date & time: 10/30/24  1945     Patient presents with: Shortness of Breath   Michele Craig is a 53 y.o. female.   53 year old female with a history of CHF, rheumatoid arthritis, diabetes, and gastric sleeve who presents to the emergency department with shortness of breath and cough.  Patient was recently admitted to the hospital and discharged several days ago after initial diagnosis of heart failure.  Since going home has been taking her diuretics.  No significant increase in leg swelling.  Says that last night she started developing a cough.  Also has had some substernal chest tightness that is pleuritic.  Mild shortness of breath.  No fevers or chills.  No runny nose or sore throat.  No known sick contacts.  Is having some left leg pain that she had a DVT ultrasound for her most recent admission       Prior to Admission medications  Medication Sig Start Date End Date Taking? Authorizing Provider  acetaminophen  (TYLENOL ) 500 MG tablet Take 2 tablets (1,000 mg total) by mouth 3 (three) times daily. 10/28/24   Johnson, Clanford L, MD  ADVAIR  DISKUS 250-50 MCG/DOSE AEPB Inhale 1 puff into the lungs 2 (two) times daily.  05/17/14   [provider]  albuterol  (VENTOLIN  HFA) 108 (90 Base) MCG/ACT inhaler Inhale 2 puffs into the lungs every 4 (four) hours as needed for wheezing or shortness of breath.    [provider]  ARIPiprazole  (ABILIFY ) 10 MG tablet Take 10 mg by mouth every morning. 04/18/21   [provider]  buPROPion  (WELLBUTRIN  XL) 150 MG 24 hr tablet Take 150 mg by mouth every morning. 05/17/18   [provider]  buPROPion  (WELLBUTRIN  XL) 300 MG 24 hr tablet Take 300 mg by mouth every morning. 09/02/24   [provider]  clonazePAM  (KLONOPIN ) 2 MG tablet Take 1 tablet (2 mg total) by mouth at bedtime as needed for anxiety. 10/28/24   Johnson,  Clanford L, MD  DULoxetine  (CYMBALTA ) 60 MG capsule Take 1 capsule (60 mg total) by mouth every morning. 10/28/24   Johnson, Clanford L, MD  fluticasone  (FLONASE ) 50 MCG/ACT nasal spray Place 1 spray into both nostrils at bedtime. 04/17/21   [provider]  fluticasone  (FLOVENT  HFA) 44 MCG/ACT inhaler Inhale 1 puff into the lungs daily as needed (for shortness of breath).    [provider]  gabapentin  (NEURONTIN ) 100 MG capsule Take 400 mg by mouth 2 (two) times daily. 07/07/21   [provider]  guaiFENesin -codeine  100-10 MG/5ML syrup Take 5 mLs by mouth 3 (three) times daily as needed for cough. 10/30/24   Yolande Lamar BROCKS, MD  levocetirizine (XYZAL ) 5 MG tablet Take 5 mg by mouth every evening. 04/30/21   [provider]  levonorgestrel  (MIRENA , 52 MG,) 20 MCG/DAY IUD Mirena  20 mcg/24 hours (7 yrs) 52 mg intrauterine device  Take 1 device by intrauterine route.    [provider]  methocarbamol  (ROBAXIN ) 500 MG tablet Take 500 mg by mouth 3 (three) times daily as needed for muscle spasms. 09/02/24   [provider]  montelukast  (SINGULAIR ) 10 MG tablet Take 1 tablet (10 mg total) by mouth at bedtime. 10/28/24   Johnson, Clanford L, MD  omeprazole (PRILOSEC) 20 MG capsule Take 20 mg by mouth every evening. 07/05/21   [provider]  potassium chloride  SA (KLOR-CON  M) 10 MEQ tablet  Take 1 tablet (10 mEq total) by mouth daily. 10/28/24   Johnson, Clanford L, MD  spironolactone  (ALDACTONE ) 25 MG tablet Take 1 tablet (25 mg total) by mouth daily. 10/28/24   Johnson, Clanford L, MD  SUMAtriptan  (IMITREX ) 100 MG tablet Take 100 mg by mouth every 2 (two) hours as needed for migraine. 11/28/21   [provider]  torsemide  (DEMADEX ) 20 MG tablet Take 2 tablets (40 mg total) by mouth daily. 10/29/24   Johnson, Clanford L, MD  traZODone  (DESYREL ) 100 MG tablet Take 100 mg by mouth at bedtime. 04/18/21   [provider]  Vitamin D ,  Ergocalciferol , (DRISDOL ) 1.25 MG (50000 UNIT) CAPS capsule Take 50,000 Units by mouth every Sunday. 05/28/22   [provider]  [Paused] WEGOVY 1.7 MG/0.75ML SOAJ SQ injection Inject 1.7 mg into the skin once a week. Patient not taking: Reported on 10/23/2024 Wait to take this until your doctor or other care provider tells you to start again. 07/28/24   [provider]    Allergies: Penicillins, Shellfish allergy, Cefadroxil , Clindamycin hcl, Zosyn  [piperacillin  sod-tazobactam so], and Clindamycin    Review of Systems  Updated Vital Signs BP 106/85   Pulse 84   Temp 98.3 F (36.8 C) (Oral)   Resp 18   Ht 5' 7 (1.702 m)   Wt (!) 136.7 kg   SpO2 98%   BMI 47.21 kg/m   Physical Exam Vitals and nursing note reviewed.  Constitutional:      General: She is not in acute distress.    Appearance: She is well-developed.  HENT:     Head: Normocephalic and atraumatic.     Right Ear: External ear normal.     Left Ear: External ear normal.     Nose: Nose normal.  Eyes:     Extraocular Movements: Extraocular movements intact.     Conjunctiva/sclera: Conjunctivae normal.     Pupils: Pupils are equal, round, and reactive to light.  Cardiovascular:     Rate and Rhythm: Normal rate and regular rhythm.     Heart sounds: No murmur heard. Pulmonary:     Effort: Pulmonary effort is normal. No respiratory distress.     Breath sounds: Normal breath sounds.  Musculoskeletal:     Cervical back: Normal range of motion and neck supple.     Right lower leg: No edema.     Left lower leg: No edema.  Skin:    General: Skin is warm and dry.  Neurological:     Mental Status: She is alert and oriented to person, place, and time. Mental status is at baseline.  Psychiatric:        Mood and Affect: Mood normal.     (all labs ordered are listed, but only abnormal results are displayed) Labs Reviewed  BASIC METABOLIC PANEL WITH GFR - Abnormal; Notable for the following components:       Result Value   Potassium 3.4 (*)    Glucose, Bld 114 (*)    Calcium 8.0 (*)    All other components within normal limits  RESP PANEL BY RT-PCR (RSV, FLU A&B, COVID)  RVPGX2  CBC  PRO BRAIN NATRIURETIC PEPTIDE  TROPONIN T, HIGH SENSITIVITY    EKG: EKG Interpretation Date/Time:  Sunday October 30 2024 20:07:08 EST Ventricular Rate:  97 PR Interval:  178 QRS Duration:  149 QT Interval:  396 QTC Calculation: 504 R Axis:   22  Text Interpretation: Sinus rhythm Left bundle branch block Confirmed by Yolande,  Lamar [phone]) on 10/30/2024 9:57:07 PM  Radiology: CT Angio Chest PE W and/or Wo Contrast Result Date: 10/30/2024 EXAM: CTA CHEST 10/30/2024 10:41:53 PM TECHNIQUE: CTA of the chest was performed after the administration of intravenous contrast. Without and with IV contrast was administered, including 100 mL iohexol  (OMNIPAQUE ) 350 MG/ML injection. Multiplanar reformatted images are provided for review. MIP images are provided for review. Automated exposure control, iterative reconstruction, and/or weight based adjustment of the mA/kV was utilized to reduce the radiation dose to as low as reasonably achievable. COMPARISON: Same day x-ray. CLINICAL HISTORY: sob, cough FINDINGS: PULMONARY ARTERIES: Pulmonary arteries are adequately opacified for evaluation. Negative for pulmonary embolism. Main pulmonary artery is normal in caliber. MEDIASTINUM: The heart and pericardium demonstrate no acute abnormality. There is no acute abnormality of the thoracic aorta. LYMPH NODES: No mediastinal, hilar or axillary lymphadenopathy. LUNGS AND PLEURA: The lungs are without acute process. No focal consolidation or pulmonary edema. No evidence of pleural effusion or pneumothorax. UPPER ABDOMEN: Postoperative change about the stomach. SOFT TISSUES AND BONES: No acute bone or soft tissue abnormality. IMPRESSION: 1. No pulmonary embolism. Electronically signed by: Norman Gatlin MD 10/30/2024 10:55 PM EST  RP Workstation: [id number]   DG Chest Port 1 View Result Date: 10/30/2024 EXAM: 1 VIEW(S) XRAY OF THE CHEST 10/30/2024 08:36:21 PM COMPARISON: 10/23/2024 CLINICAL HISTORY: Cough FINDINGS: LUNGS AND PLEURA: No focal pulmonary opacity. No pleural effusion. No pneumothorax. HEART AND MEDIASTINUM: No acute abnormality of the cardiac and mediastinal silhouettes. BONES AND SOFT TISSUES: Partially visualized cervical fusion hardware. IMPRESSION: 1. No acute process. Electronically signed by: Greig Pique MD 10/30/2024 08:37 PM EST RP Workstation: [id number]     Procedures   Medications Ordered in the ED  potassium chloride  SA (KLOR-CON  M) CR tablet 40 mEq (40 mEq Oral Given 10/30/24 2136)  iohexol  (OMNIPAQUE ) 350 MG/ML injection 100 mL (100 mLs Intravenous Contrast Given 10/30/24 2226)                                    Medical Decision Making Amount and/or Complexity of Data Reviewed Labs: ordered. Radiology: ordered.  Risk OTC drugs. Prescription drug management.   Mitsue B Clemenson is a 53 year old female with a history of CHF, rheumatoid arthritis, diabetes, and gastric sleeve who presents to the emergency department with shortness of breath and cough.   Initial Ddx:  CHF, URI, pneumonia, PE  MDM/Course:  Patient presents emergency department shortness of breath and cough.  Started today.  On exam is overall well-appearing.  Does not appear to be volume overloaded.  Satting well on room air.  COVID and flu negative.  BNP undetectably low.  Chest x-ray without pulmonary edema.  Chemistry with borderline low potassium which was replenished orally.  With her leg pain that she was complaining of recently did obtain a CTA which did not show evidence of PE.  No pneumonia on her CT either.  Has already had an ultrasound of her leg which did not show evidence of DVT so do not feel that repeat is warranted at this point in time.  Upon re-evaluation she remains overall well-appearing.  I suspect  that she has a URI.  She is requesting cough medicine that will help her sleep so we will give her codeine  and have her follow-up with her primary doctor  At the end of the visit did report that she has had a chronic cough that is been off-and-on  for months.  Will have her follow-up with pulmonology for this  This patient presents to the ED for concern of complaints listed in HPI, this involves an extensive number of treatment options, and is a complaint that carries with it a high risk of complications and morbidity. Disposition including potential need for admission considered.   Dispo: DC Home. Return precautions discussed including, but not limited to, those listed in the AVS. Allowed pt time to ask questions which were answered fully prior to dc.  I have reviewed the patients home medications and made adjustments as needed Additional history obtained from significant other Records reviewed Outpatient Clinic Notes The following labs were independently interpreted: Chemistry and show mild hypokalemia I independently reviewed the following imaging with scope of interpretation limited to determining acute life threatening conditions related to emergency care: Chest x-ray and agree with the radiologist interpretation with the following exceptions: none I personally reviewed and interpreted cardiac monitoring: normal sinus rhythm  I personally reviewed and interpreted the pt's EKG: see above for interpretation   Portions of this note were generated with Dragon dictation software. Dictation errors may occur despite best attempts at proofreading.     Final diagnoses:  Upper respiratory tract infection, unspecified type  Chronic cough    ED Discharge Orders          Ordered    guaiFENesin -codeine  100-10 MG/5ML syrup  3 times daily PRN,   Status:  Discontinued        10/30/24 2304    guaiFENesin -codeine  100-10 MG/5ML syrup  3 times daily PRN        10/30/24 2315                Yolande Lamar BROCKS, MD 10/30/24 2324    Yolande Lamar BROCKS, MD 10/30/24 2325  "

## 2024-10-31 ENCOUNTER — Telehealth: Payer: Self-pay

## 2024-10-31 NOTE — Transitions of Care (Post Inpatient/ED Visit) (Signed)
" ° °  10/31/2024  Name: Michele Craig MRN: [id number] DOB: 1971-04-30  Today's TOC FU Call Status: Today's TOC FU Call Status:: Unsuccessful Call (1st Attempt) Unsuccessful Call (1st Attempt) Date: 10/31/24  Attempted to reach the patient regarding the most recent Inpatient/ED visit.  Follow Up Plan: Additional outreach attempts will be made to reach the patient to complete the Transitions of Care (Post Inpatient/ED visit) call.   Signature Julian Lemmings, LPN [facility] Nurse Health Advisor Direct Dial [phone]  "

## 2024-11-01 ENCOUNTER — Ambulatory Visit: Payer: Self-pay

## 2024-11-01 ENCOUNTER — Telehealth: Payer: Self-pay | Admitting: Adult Health Nurse Practitioner

## 2024-11-01 ENCOUNTER — Ambulatory Visit (HOSPITAL_COMMUNITY)
Admit: 2024-11-01 | Discharge: 2024-11-01 | Disposition: A | Discharge status: Home / Self Care | Attending: Cardiology | Admitting: Cardiology

## 2024-11-01 ENCOUNTER — Other Ambulatory Visit (HOSPITAL_COMMUNITY): Payer: Self-pay

## 2024-11-01 ENCOUNTER — Encounter (HOSPITAL_COMMUNITY): Payer: Self-pay

## 2024-11-01 ENCOUNTER — Ambulatory Visit (HOSPITAL_COMMUNITY): Payer: Self-pay | Admitting: Cardiology

## 2024-11-01 VITALS — BP 122/91 | HR 63 | Ht 67.0 in | Wt 308.0 lb

## 2024-11-01 DIAGNOSIS — Z7985 Long-term (current) use of injectable non-insulin antidiabetic drugs: Secondary | ICD-10-CM | POA: Diagnosis not present

## 2024-11-01 DIAGNOSIS — I110 Hypertensive heart disease with heart failure: Secondary | ICD-10-CM | POA: Insufficient documentation

## 2024-11-01 DIAGNOSIS — E119 Type 2 diabetes mellitus without complications: Secondary | ICD-10-CM | POA: Diagnosis not present

## 2024-11-01 DIAGNOSIS — I5022 Chronic systolic (congestive) heart failure: Secondary | ICD-10-CM | POA: Diagnosis not present

## 2024-11-01 DIAGNOSIS — Z79899 Other long term (current) drug therapy: Secondary | ICD-10-CM | POA: Diagnosis not present

## 2024-11-01 DIAGNOSIS — I5040 Unspecified combined systolic (congestive) and diastolic (congestive) heart failure: Secondary | ICD-10-CM | POA: Insufficient documentation

## 2024-11-01 DIAGNOSIS — Z793 Long term (current) use of hormonal contraceptives: Secondary | ICD-10-CM | POA: Diagnosis not present

## 2024-11-01 DIAGNOSIS — Z7951 Long term (current) use of inhaled steroids: Secondary | ICD-10-CM | POA: Insufficient documentation

## 2024-11-01 DIAGNOSIS — Z6841 Body Mass Index (BMI) 40.0 and over, adult: Secondary | ICD-10-CM | POA: Diagnosis not present

## 2024-11-01 DIAGNOSIS — M069 Rheumatoid arthritis, unspecified: Secondary | ICD-10-CM | POA: Insufficient documentation

## 2024-11-01 DIAGNOSIS — E669 Obesity, unspecified: Secondary | ICD-10-CM | POA: Diagnosis not present

## 2024-11-01 LAB — BASIC METABOLIC PANEL WITH GFR
Anion gap: 9 (ref 5–15)
BUN: 15 mg/dL (ref 6–20)
CO2: 24 mmol/L (ref 22–32)
Calcium: 8.3 mg/dL — ABNORMAL LOW (ref 8.9–10.3)
Chloride: 107 mmol/L (ref 98–111)
Creatinine, Ser: 1.04 mg/dL — ABNORMAL HIGH (ref 0.44–1.00)
GFR, Estimated: >60 mL/min
Glucose, Bld: 91 mg/dL (ref 70–99)
Potassium: 3.9 mmol/L (ref 3.5–5.1)
Sodium: 140 mmol/L (ref 135–145)

## 2024-11-01 NOTE — Transitions of Care (Post Inpatient/ED Visit) (Signed)
" ° °  11/01/2024  Name: Michele Craig MRN: [id number] DOB: 05-Oct-1971  Today's TOC FU Call Status: Today's TOC FU Call Status:: Unsuccessful Call (2nd Attempt) Unsuccessful Call (1st Attempt) Date: 10/31/24 Unsuccessful Call (2nd Attempt) Date: 11/01/24  Attempted to reach the patient regarding the most recent Inpatient/ED visit.  Follow Up Plan: Additional outreach attempts will be made to reach the patient to complete the Transitions of Care (Post Inpatient/ED visit) call.   Signature Julian Lemmings, LPN [facility] Nurse Health Advisor Direct Dial [phone]  "

## 2024-11-01 NOTE — Telephone Encounter (Signed)
 FYI Only or Action Required?: FYI only for provider: appointment scheduled on 11/23/2024 at 10:30 AM-recommended to [facility] or ED today. Patient only wanted to be scheduled at [facility] office.  Patient is followed in Pulmonology for Asthma, last seen on 02/24/2022 by Kara Dorn NOVAK, MD.  Called Nurse Triage reporting Shortness of Breath.  Symptoms began several weeks ago.  Interventions attempted: Rescue inhaler and Increased fluids/rest.  Symptoms are: unchanged.  Triage Disposition: See PCP Within 2 Weeks  Patient/caregiver understands and will follow disposition?: No, wishes to speak with PCP E2C2 Pulmonary Triage - Initial Assessment Questions Chief Complaint (e.g., cough, sob, wheezing, fever, chills, sweat or additional symptoms) *Go to specific symptom protocol after initial questions. Patient hasn't been seen since 2023-calling in with concerns for shortness of breath, wheezing and cough. Patient was seen in the ED on 10/30/2024. Patient reports good use of inhaler. Patient is recommended to follow up at [facility] or ED. Patient was transferred to agent for scheduling not realizing patient is established. Patient requesting to be seen at [facility] office. Was scheduled for Nov 23, 2024 at 10:30 AM.   How long have symptoms been present? Reports symptoms have been going on for three months  Have you tested for COVID or Flu? Note: If not, ask patient if a home test can be taken. If so, instruct patient to call back for positive results. No  MEDICINES:   Have you used any OTC meds to help with symptoms? No If yes, ask What medications?   Have you used your inhalers/maintenance medication? Yes If yes, What medications? albuterol   If inhaler, ask How many puffs and how often? Note: Review instructions on medication in the chart. albuterol   OXYGEN: Do you wear supplemental oxygen? No If yes, How many liters are you supposed to use?   Do you monitor your oxygen  levels? No If yes, What is your reading (oxygen level) today?   What is your usual oxygen saturation reading?  (Note: Pulmonary O2 sats should be 90% or greater)  Copied from CRM #[id number]. Topic: Clinical - Red Word Triage >> Nov 01, 2024  2:12 PM Corean SAUNDERS wrote: Red Word that prompted transfer to Nurse Triage: SOB, wheezing and cough Reason for Disposition  [1] MILD longstanding difficulty breathing (e.g., minimal/no SOB at rest, SOB with walking, pulse < 100) AND [2] SAME as normal  Answer Assessment - Initial Assessment Questions 1. RESPIRATORY STATUS: Describe your breathing? (e.g., wheezing, shortness of breath, unable to speak, severe coughing)      Shortness of breath, wheezing, cough 2. ONSET: When did this breathing problem begin?      3 months 3. PATTERN Does the difficult breathing come and go, or has it been constant since it started?      Comes and goes 4. SEVERITY: How bad is your breathing? (e.g., mild, moderate, severe)      moderate 5. RECURRENT SYMPTOM: Have you had difficulty breathing before? If Yes, ask: When was the last time? and What happened that time?      yes 6. CARDIAC HISTORY: Do you have any history of heart disease? (e.g., heart attack, angina, bypass surgery, angioplasty)      no 7. LUNG HISTORY: Do you have any history of lung disease?  (e.g., pulmonary embolus, asthma, emphysema)     asthma 8. CAUSE: What do you think is causing the breathing problem?      unsure 9. OTHER SYMPTOMS: Do you have any other symptoms? (e.g., chest pain, cough, dizziness,  fever, runny nose)     Runny nose 10. O2 SATURATION MONITOR:  Do you use an oxygen saturation monitor (pulse oximeter) at home? If Yes, ask: What is your reading (oxygen level) today? What is your usual oxygen saturation reading? (e.g., 95%)       N/A-patient states her's is in storage 12. TRAVEL: Have you traveled out of the country in the last month? (e.g., travel  history, exposures)       no  Protocols used: Breathing Difficulty-A-AH

## 2024-11-01 NOTE — Progress Notes (Signed)
 "    HEART & VASCULAR TRANSITION OF CARE CONSULT NOTE     Referring Physician: Dr. Vicci   Chief Complaint: F/u HFpEF   HPI: Referred to clinic by Dr. Vicci for heart failure consultation.   53 year old female with a history of systolic heart failure, rheumatoid arthritis, diabetes, OSA (not on CPAP) and gastric sleeve.  Diagnosed w/ HF in 2023. Echo 7/23 EF 40-45%, RV normal. EKGs demonstrated LBBB, QRS 148 ms. Subsequent LHC demonstrated normal coronaries. LVEDP 10. She was noted on cath to have dyssynchronous wall motion due to left bundle. She was lost to f/u w/ gen cards after cath.   She went to the [facility] ED 12/14 given complaints of worsening LEE and dyspnea. She was given diagnosis of acute CHF, though pro BNP was normal (<50.0) and CXR w/o edema. Echo showed improved EF 50-55%, RV nl. IVC noted to be normal in size with greater than 50%  respiratory variability, suggesting right atrial pressure of 3 mmHg. RVSP estimate not reported. She was diuresed w/ IV Lasix . SCr bumped from b/l of 0.8>>1.03.  Transitioned to oral lasix  20 mg daily. Discharged home on 12/19.   She presented back on 12/21 w/ complaints of recurrent dyspnea and leg pain. proBNP still normal (<0.50). CT of chest negative for PE. No focal consolidation of PE. LE venous dopplers done prior admission were also negative for DVT. Hs trop negative x 1. Lasix  was stopped and she was placed on Torsemide  40 mg daily. Continued on spiro.    She presents today for f/u. LEE much improved but c/w cough and orthopnea. NYHA Class II, confounded by obesity/deconditioning. She reports good UOP w/ torsemide  and reports full compliance.   She says she was diagnosed w/ OSA several years ago but stopped CPAP after snoring resolved w/ wt loss.   She is scheduled to see pulmonology next month.    Cardiac Testing   2D Echo 12/25 1. Left ventricular ejection fraction, by estimation, is 50 to 55%. The  left ventricle has low  normal function. The left ventricle has no regional  wall motion abnormalities. There is mild asymmetric left ventricular  hypertrophy of the basal-septal  segment. Left ventricular diastolic parameters are indeterminate.   2. Right ventricular systolic function is normal. The right ventricular  size is normal.   3. The mitral valve is normal in structure. No evidence of mitral valve  regurgitation. No evidence of mitral stenosis.   4. The aortic valve is tricuspid. Aortic valve regurgitation is not  visualized. No aortic stenosis is present.   5. The inferior vena cava is normal in size with greater than 50%  respiratory variability, suggesting right atrial pressure of 3 mmHg.    LHC 2023  CONCLUSIONS: Normal coronary arteries LVEDP 10 mmHg Patient has dyssynchronous wall motion due to left bundle.  This could explain the abnormalities noted on non-invasive testing.    Past Medical History:  Diagnosis Date   Anemia    Anxiety    Arthritis    Complication of anesthesia    with carpal tunnel syndrome the block didn't work well, she could feel things during the surgery   Depression    Family history of adverse reaction to anesthesia    unknown, pt is adopted   Fibromyalgia    Foot pain    GERD (gastroesophageal reflux disease)    Headache    Hypertension    IBS (irritable bowel syndrome)    after cholecystectomy and then it went  away after Gastric sleeve   LBBB (left bundle branch block)    Moderate persistent asthma    OSA (obstructive sleep apnea)    does not use cpap   RA (rheumatoid arthritis) (HCC)    Shortness of breath dyspnea    only when her asthma is flaring up   Type 2 diabetes mellitus (HCC)    pt states after her gastric sleeve procedure she is no longer on medications and is not checked for it    Current Outpatient Medications  Medication Sig Dispense Refill   acetaminophen  (TYLENOL ) 500 MG tablet Take 2 tablets (1,000 mg total) by mouth 3 (three) times  daily.     albuterol  (VENTOLIN  HFA) 108 (90 Base) MCG/ACT inhaler Inhale 2 puffs into the lungs every 4 (four) hours as needed for wheezing or shortness of breath.     clonazePAM  (KLONOPIN ) 2 MG tablet Take 1 tablet (2 mg total) by mouth at bedtime as needed for anxiety. 15 tablet 0   DULoxetine  (CYMBALTA ) 60 MG capsule Take 1 capsule (60 mg total) by mouth every morning. 30 capsule 0   fluticasone  (FLONASE ) 50 MCG/ACT nasal spray Place 1 spray into both nostrils at bedtime.     fluticasone  (FLOVENT  HFA) 44 MCG/ACT inhaler Inhale 1 puff into the lungs daily as needed (for shortness of breath).     gabapentin  (NEURONTIN ) 100 MG capsule Take 400 mg by mouth 2 (two) times daily.     guaiFENesin -codeine  100-10 MG/5ML syrup Take 5 mLs by mouth 3 (three) times daily as needed for cough. 120 mL 0   levocetirizine (XYZAL ) 5 MG tablet Take 5 mg by mouth every evening.     levonorgestrel  (MIRENA , 52 MG,) 20 MCG/DAY IUD Mirena  20 mcg/24 hours (7 yrs) 52 mg intrauterine device  Take 1 device by intrauterine route.     methocarbamol  (ROBAXIN ) 500 MG tablet Take 500 mg by mouth 3 (three) times daily as needed for muscle spasms.     omeprazole (PRILOSEC) 20 MG capsule Take 20 mg by mouth every evening.     potassium chloride  SA (KLOR-CON  M) 10 MEQ tablet Take 1 tablet (10 mEq total) by mouth daily. 30 tablet 0   spironolactone  (ALDACTONE ) 25 MG tablet Take 1 tablet (25 mg total) by mouth daily. 30 tablet 1   SUMAtriptan  (IMITREX ) 100 MG tablet Take 100 mg by mouth every 2 (two) hours as needed for migraine.     torsemide  (DEMADEX ) 20 MG tablet Take 2 tablets (40 mg total) by mouth daily. 60 tablet 1   traZODone  (DESYREL ) 100 MG tablet Take 100 mg by mouth at bedtime.     Vitamin D , Ergocalciferol , (DRISDOL ) 1.25 MG (50000 UNIT) CAPS capsule Take 50,000 Units by mouth every Sunday.     ADVAIR  DISKUS 250-50 MCG/DOSE AEPB Inhale 1 puff into the lungs 2 (two) times daily.  (Patient not taking: Reported on  11/01/2024)     ARIPiprazole  (ABILIFY ) 10 MG tablet Take 10 mg by mouth every morning. (Patient not taking: Reported on 11/01/2024)     buPROPion  (WELLBUTRIN  XL) 150 MG 24 hr tablet Take 150 mg by mouth every morning. (Patient not taking: Reported on 11/01/2024)     [Paused] WEGOVY 1.7 MG/0.75ML SOAJ SQ injection Inject 1.7 mg into the skin once a week. (Patient not taking: Reported on 11/01/2024)     No current facility-administered medications for this encounter.    Allergies[1]    Social History   Socioeconomic History   Marital status: Married  Spouse name: Cornell   Number of children: Not on file   Years of education: Not on file   Highest education level: Not on file  Occupational History   Not on file  Tobacco Use   Smoking status: Never   Smokeless tobacco: Never  Vaping Use   Vaping status: Never Used  Substance and Sexual Activity   Alcohol use: Not Currently   Drug use: No   Sexual activity: Yes    Birth control/protection: I.U.D.    Comment: 1st intercourse 41 yo-5 partners  Other Topics Concern   Not on file  Social History Narrative   Not on file   Social Drivers of Health   Tobacco Use: Low Risk (11/01/2024)   Patient History    Smoking Tobacco Use: Never    Smokeless Tobacco Use: Never    Passive Exposure: Not on file  Financial Resource Strain: Not on file  Food Insecurity: No Food Insecurity (10/23/2024)   Epic    Worried About Programme Researcher, Broadcasting/film/video in the Last Year: Never true    Ran Out of Food in the Last Year: Never true  Transportation Needs: No Transportation Needs (10/23/2024)   Epic    Lack of Transportation (Medical): No    Lack of Transportation (Non-Medical): No  Physical Activity: Not on file  Stress: Not on file  Social Connections: Not on file  Intimate Partner Violence: Not At Risk (10/23/2024)   Epic    Fear of Current or Ex-Partner: No    Emotionally Abused: No    Physically Abused: No    Sexually Abused: No  Depression  (PHQ2-9): High Risk (06/11/2023)   Depression (PHQ2-9)    PHQ-2 Score: 19  Alcohol Screen: Not on file  Housing: Low Risk (10/23/2024)   Epic    Unable to Pay for Housing in the Last Year: No    Number of Times Moved in the Last Year: 0    Homeless in the Last Year: No  Utilities: Not At Risk (10/23/2024)   Epic    Threatened with loss of utilities: No  Health Literacy: Not on file      Family History  Adopted: Yes  Family history unknown: Yes    Vitals:   11/01/24 1441  BP: (!) 122/91  Pulse: 63  SpO2: 97%  Weight: (!) 139.7 kg (308 lb)  Height: 5' 7 (1.702 m)    PHYSICAL EXAM: General:  morbidly obese, NAD Neck: supple. Thick neck, JVD not well visualized  Cor: PMI nondisplaced. Regular rate & rhythm. No rubs, gallops or murmurs. Lungs: + expiratory wheezing b/l  Abdomen: obese, soft, NT  Extremities: no cyanosis, clubbing, rash. Trace b/l LEE  Neuro: alert & oriented x 3, cranial nerves grossly intact. moves all 4 extremities w/o difficulty. Affect pleasant.  ECG: not performed    ASSESSMENT & PLAN:  1. HFimEF - NICM, Echo 2023 EF 40-45%, RV ok>>LHC normal cors - Echo EF 12/25 EF improved 50-55%, RV ok per report  - NYHA II confounded by obesity/deconditioning  - Volume assessment difficult d/t body habitus but based on symptoms of orthopnea and mild peripheral edema on exam, suspect continued volume overload. Her diuretic was just increased by the ED 2 days ago, switched from 20 mg of Lasix  to 40 mg of Torsemide . She reports good UOP w/ regimen. Will continue for now, along w/ spironolactone  25 mg. RVSP estimate is not documented on echo report but I suspect she likely has some degree of PH.  She likely still has OSA and likely OHS. Also given RA history, PH should further be evaluated for. I will arrange for outpatient RHC to assess filling pressures to guide further management and diuretics. Will bring back to [facility] clinic for 1 additional visit post cath to help w/  diuretic regimen, then will plan to refer to gen cards thereafter in [place].   2. Probable OSA - pt no longer using CPAP, she discontinued after wt loss surgery but she is still obese w/ Body mass index 48.24 kg/m. - recommend repeat assessment w/ sleep study. Can d/w pulmonology at upcoming visit  - would also benefit from GLP1       Referred to HFSW (PCP, Medications, Transportation, ETOH Abuse, Drug Abuse, Insurance, Financial ): No  Refer to Pharmacy: No  Refer to Home Health: No  Refer to Advanced Heart Failure Clinic: No  Refer to General Cardiology: Yes (refer next visit)   Follow up  in [facility] clinic in 4 wks to help w/ diuretic regimen then refer to gen cards therafter.   Caffie Shed, PA-C 11/01/2024     [1]  Allergies Allergen Reactions   Penicillins Hives, Itching, Dermatitis and Rash   Shellfish Allergy Anaphylaxis and Other (See Comments)    Crawdads   Cefadroxil  Hives, Diarrhea, Rash and Other (See Comments)    Abdominal pain   Clindamycin Hcl Other (See Comments)    Unknown    Zosyn  [Piperacillin  Sod-Tazobactam So] Hives and Itching   Clindamycin Itching, Nausea Only, Rash and Other (See Comments)    Stomach pains   "

## 2024-11-01 NOTE — Patient Instructions (Addendum)
 Good to see you today!  Labs done today, your results will be available in MyChart, we will contact you for abnormal readings.  Your physician recommends that you schedule a follow-up appointment  as scheduled.  You are scheduled for a Cardiac Catheterization on Friday, January 9 with Dr. Zenaida.  1. Please arrive at the [facility] (Main Entrance A) at [facility]: [address] [place], [place] [address] at 5:30 AM (This time is 2 hour(s) before your procedure to ensure your preparation).   Free valet parking service is available. You will check in at ADMITTING. The support person will be asked to wait in the waiting room.  It is OK to have someone drop you off and come back when you are ready to be discharged.    Special note: Every effort is made to have your procedure done on time. Please understand that emergencies sometimes delay scheduled procedures.  2. Diet: NPO: Nothing to eat OR drink after midnight. (For TEE and Cath the same day)   4. Labs: today,  Contrast Allergy: No  MORNING  of procedure hold  dose of spironolactone  and torsemide   On the morning of your procedure, take your morning medicines NOT listed above.  You may use sips of water.  6. Plan to go home the same day, you will only stay overnight if medically necessary. 7. Bring a current list of your medications and current insurance cards. 8. You MUST have a responsible person to drive you home. 9. Someone MUST be with you the first 24 hours after you arrive home or your discharge will be delayed. 10. Please wear clothes that are easy to get on and off and wear slip-on shoes.  Thank you for allowing us  to care for you!   -- [facility]  If you have any questions or concerns before your next appointment please send us  a message through [facility] or call our office at [phone].    TO LEAVE A MESSAGE FOR THE NURSE SELECT OPTION 2, PLEASE LEAVE A MESSAGE INCLUDING: YOUR  NAME DATE OF BIRTH CALL BACK NUMBER REASON FOR CALL**this is important as we prioritize the call backs  YOU WILL RECEIVE A CALL BACK THE SAME DAY AS LONG AS YOU CALL BEFORE 4:00 PM At the [facility], you and your health needs are our priority. As part of our continuing mission to provide you with exceptional heart care, we have created designated Provider Care Teams. These Care Teams include your primary Cardiologist (physician) and Advanced Practice Providers (APPs- Physician Assistants and Nurse Practitioners) who all work together to provide you with the care you need, when you need it.   You may see any of the following providers on your designated Care Team at your next follow up: Dr Toribio Fuel Dr Ezra Shuck Dr. Morene Michele Craig Greig Mosses, NP Caffie Shed, [place] [facility] [place], [place] Beckey Coe, NP Jordan Lee, NP Ellouise Class, NP Tinnie Redman, PharmD Jaun Bash, PharmD   Please be sure to bring in all your medications bottles to every appointment.    Thank you for choosing [facility]

## 2024-11-01 NOTE — H&P (View-Only) (Signed)
 "    HEART & VASCULAR TRANSITION OF CARE CONSULT NOTE     Referring Physician: Dr. Vicci   Chief Complaint: F/u HFpEF   HPI: Referred to clinic by Dr. Vicci for heart failure consultation.   53 year old female with a history of systolic heart failure, rheumatoid arthritis, diabetes, OSA (not on CPAP) and gastric sleeve.  Diagnosed w/ HF in 2023. Echo 7/23 EF 40-45%, RV normal. EKGs demonstrated LBBB, QRS 148 ms. Subsequent LHC demonstrated normal coronaries. LVEDP 10. She was noted on cath to have dyssynchronous wall motion due to left bundle. She was lost to f/u w/ gen cards after cath.   She went to the [facility] ED 12/14 given complaints of worsening LEE and dyspnea. She was given diagnosis of acute CHF, though pro BNP was normal (<50.0) and CXR w/o edema. Echo showed improved EF 50-55%, RV nl. IVC noted to be normal in size with greater than 50%  respiratory variability, suggesting right atrial pressure of 3 mmHg. RVSP estimate not reported. She was diuresed w/ IV Lasix . SCr bumped from b/l of 0.8>>1.03.  Transitioned to oral lasix  20 mg daily. Discharged home on 12/19.   She presented back on 12/21 w/ complaints of recurrent dyspnea and leg pain. proBNP still normal (<0.50). CT of chest negative for PE. No focal consolidation of PE. LE venous dopplers done prior admission were also negative for DVT. Hs trop negative x 1. Lasix  was stopped and she was placed on Torsemide  40 mg daily. Continued on spiro.    She presents today for f/u. LEE much improved but c/w cough and orthopnea. NYHA Class II, confounded by obesity/deconditioning. She reports good UOP w/ torsemide  and reports full compliance.   She says she was diagnosed w/ OSA several years ago but stopped CPAP after snoring resolved w/ wt loss.   She is scheduled to see pulmonology next month.    Cardiac Testing   2D Echo 12/25 1. Left ventricular ejection fraction, by estimation, is 50 to 55%. The  left ventricle has low  normal function. The left ventricle has no regional  wall motion abnormalities. There is mild asymmetric left ventricular  hypertrophy of the basal-septal  segment. Left ventricular diastolic parameters are indeterminate.   2. Right ventricular systolic function is normal. The right ventricular  size is normal.   3. The mitral valve is normal in structure. No evidence of mitral valve  regurgitation. No evidence of mitral stenosis.   4. The aortic valve is tricuspid. Aortic valve regurgitation is not  visualized. No aortic stenosis is present.   5. The inferior vena cava is normal in size with greater than 50%  respiratory variability, suggesting right atrial pressure of 3 mmHg.    LHC 2023  CONCLUSIONS: Normal coronary arteries LVEDP 10 mmHg Patient has dyssynchronous wall motion due to left bundle.  This could explain the abnormalities noted on non-invasive testing.    Past Medical History:  Diagnosis Date   Anemia    Anxiety    Arthritis    Complication of anesthesia    with carpal tunnel syndrome the block didn't work well, she could feel things during the surgery   Depression    Family history of adverse reaction to anesthesia    unknown, pt is adopted   Fibromyalgia    Foot pain    GERD (gastroesophageal reflux disease)    Headache    Hypertension    IBS (irritable bowel syndrome)    after cholecystectomy and then it went  away after Gastric sleeve   LBBB (left bundle branch block)    Moderate persistent asthma    OSA (obstructive sleep apnea)    does not use cpap   RA (rheumatoid arthritis) (HCC)    Shortness of breath dyspnea    only when her asthma is flaring up   Type 2 diabetes mellitus (HCC)    pt states after her gastric sleeve procedure she is no longer on medications and is not checked for it    Current Outpatient Medications  Medication Sig Dispense Refill   acetaminophen  (TYLENOL ) 500 MG tablet Take 2 tablets (1,000 mg total) by mouth 3 (three) times  daily.     albuterol  (VENTOLIN  HFA) 108 (90 Base) MCG/ACT inhaler Inhale 2 puffs into the lungs every 4 (four) hours as needed for wheezing or shortness of breath.     clonazePAM  (KLONOPIN ) 2 MG tablet Take 1 tablet (2 mg total) by mouth at bedtime as needed for anxiety. 15 tablet 0   DULoxetine  (CYMBALTA ) 60 MG capsule Take 1 capsule (60 mg total) by mouth every morning. 30 capsule 0   fluticasone  (FLONASE ) 50 MCG/ACT nasal spray Place 1 spray into both nostrils at bedtime.     fluticasone  (FLOVENT  HFA) 44 MCG/ACT inhaler Inhale 1 puff into the lungs daily as needed (for shortness of breath).     gabapentin  (NEURONTIN ) 100 MG capsule Take 400 mg by mouth 2 (two) times daily.     guaiFENesin -codeine  100-10 MG/5ML syrup Take 5 mLs by mouth 3 (three) times daily as needed for cough. 120 mL 0   levocetirizine (XYZAL ) 5 MG tablet Take 5 mg by mouth every evening.     levonorgestrel  (MIRENA , 52 MG,) 20 MCG/DAY IUD Mirena  20 mcg/24 hours (7 yrs) 52 mg intrauterine device  Take 1 device by intrauterine route.     methocarbamol  (ROBAXIN ) 500 MG tablet Take 500 mg by mouth 3 (three) times daily as needed for muscle spasms.     omeprazole (PRILOSEC) 20 MG capsule Take 20 mg by mouth every evening.     potassium chloride  SA (KLOR-CON  M) 10 MEQ tablet Take 1 tablet (10 mEq total) by mouth daily. 30 tablet 0   spironolactone  (ALDACTONE ) 25 MG tablet Take 1 tablet (25 mg total) by mouth daily. 30 tablet 1   SUMAtriptan  (IMITREX ) 100 MG tablet Take 100 mg by mouth every 2 (two) hours as needed for migraine.     torsemide  (DEMADEX ) 20 MG tablet Take 2 tablets (40 mg total) by mouth daily. 60 tablet 1   traZODone  (DESYREL ) 100 MG tablet Take 100 mg by mouth at bedtime.     Vitamin D , Ergocalciferol , (DRISDOL ) 1.25 MG (50000 UNIT) CAPS capsule Take 50,000 Units by mouth every Sunday.     ADVAIR  DISKUS 250-50 MCG/DOSE AEPB Inhale 1 puff into the lungs 2 (two) times daily.  (Patient not taking: Reported on  11/01/2024)     ARIPiprazole  (ABILIFY ) 10 MG tablet Take 10 mg by mouth every morning. (Patient not taking: Reported on 11/01/2024)     buPROPion  (WELLBUTRIN  XL) 150 MG 24 hr tablet Take 150 mg by mouth every morning. (Patient not taking: Reported on 11/01/2024)     [Paused] WEGOVY 1.7 MG/0.75ML SOAJ SQ injection Inject 1.7 mg into the skin once a week. (Patient not taking: Reported on 11/01/2024)     No current facility-administered medications for this encounter.    Allergies[1]    Social History   Socioeconomic History   Marital status: Married  Spouse name: Cornell   Number of children: Not on file   Years of education: Not on file   Highest education level: Not on file  Occupational History   Not on file  Tobacco Use   Smoking status: Never   Smokeless tobacco: Never  Vaping Use   Vaping status: Never Used  Substance and Sexual Activity   Alcohol use: Not Currently   Drug use: No   Sexual activity: Yes    Birth control/protection: I.U.D.    Comment: 1st intercourse 41 yo-5 partners  Other Topics Concern   Not on file  Social History Narrative   Not on file   Social Drivers of Health   Tobacco Use: Low Risk (11/01/2024)   Patient History    Smoking Tobacco Use: Never    Smokeless Tobacco Use: Never    Passive Exposure: Not on file  Financial Resource Strain: Not on file  Food Insecurity: No Food Insecurity (10/23/2024)   Epic    Worried About Programme Researcher, Broadcasting/film/video in the Last Year: Never true    Ran Out of Food in the Last Year: Never true  Transportation Needs: No Transportation Needs (10/23/2024)   Epic    Lack of Transportation (Medical): No    Lack of Transportation (Non-Medical): No  Physical Activity: Not on file  Stress: Not on file  Social Connections: Not on file  Intimate Partner Violence: Not At Risk (10/23/2024)   Epic    Fear of Current or Ex-Partner: No    Emotionally Abused: No    Physically Abused: No    Sexually Abused: No  Depression  (PHQ2-9): High Risk (06/11/2023)   Depression (PHQ2-9)    PHQ-2 Score: 19  Alcohol Screen: Not on file  Housing: Low Risk (10/23/2024)   Epic    Unable to Pay for Housing in the Last Year: No    Number of Times Moved in the Last Year: 0    Homeless in the Last Year: No  Utilities: Not At Risk (10/23/2024)   Epic    Threatened with loss of utilities: No  Health Literacy: Not on file      Family History  Adopted: Yes  Family history unknown: Yes    Vitals:   11/01/24 1441  BP: (!) 122/91  Pulse: 63  SpO2: 97%  Weight: (!) 139.7 kg (308 lb)  Height: 5' 7 (1.702 m)    PHYSICAL EXAM: General:  morbidly obese, NAD Neck: supple. Thick neck, JVD not well visualized  Cor: PMI nondisplaced. Regular rate & rhythm. No rubs, gallops or murmurs. Lungs: + expiratory wheezing b/l  Abdomen: obese, soft, NT  Extremities: no cyanosis, clubbing, rash. Trace b/l LEE  Neuro: alert & oriented x 3, cranial nerves grossly intact. moves all 4 extremities w/o difficulty. Affect pleasant.  ECG: not performed    ASSESSMENT & PLAN:  1. HFimEF - NICM, Echo 2023 EF 40-45%, RV ok>>LHC normal cors - Echo EF 12/25 EF improved 50-55%, RV ok per report  - NYHA II confounded by obesity/deconditioning  - Volume assessment difficult d/t body habitus but based on symptoms of orthopnea and mild peripheral edema on exam, suspect continued volume overload. Her diuretic was just increased by the ED 2 days ago, switched from 20 mg of Lasix  to 40 mg of Torsemide . She reports good UOP w/ regimen. Will continue for now, along w/ spironolactone  25 mg. RVSP estimate is not documented on echo report but I suspect she likely has some degree of PH.  She likely still has OSA and likely OHS. Also given RA history, PH should further be evaluated for. I will arrange for outpatient RHC to assess filling pressures to guide further management and diuretics. Will bring back to [facility] clinic for 1 additional visit post cath to help w/  diuretic regimen, then will plan to refer to gen cards thereafter in [place].   2. Probable OSA - pt no longer using CPAP, she discontinued after wt loss surgery but she is still obese w/ Body mass index 48.24 kg/m. - recommend repeat assessment w/ sleep study. Can d/w pulmonology at upcoming visit  - would also benefit from GLP1       Referred to HFSW (PCP, Medications, Transportation, ETOH Abuse, Drug Abuse, Insurance, Financial ): No  Refer to Pharmacy: No  Refer to Home Health: No  Refer to Advanced Heart Failure Clinic: No  Refer to General Cardiology: Yes (refer next visit)   Follow up  in [facility] clinic in 4 wks to help w/ diuretic regimen then refer to gen cards therafter.   Caffie Shed, PA-C 11/01/2024     [1]  Allergies Allergen Reactions   Penicillins Hives, Itching, Dermatitis and Rash   Shellfish Allergy Anaphylaxis and Other (See Comments)    Crawdads   Cefadroxil  Hives, Diarrhea, Rash and Other (See Comments)    Abdominal pain   Clindamycin Hcl Other (See Comments)    Unknown    Zosyn  [Piperacillin  Sod-Tazobactam So] Hives and Itching   Clindamycin Itching, Nausea Only, Rash and Other (See Comments)    Stomach pains   "

## 2024-11-01 NOTE — Telephone Encounter (Signed)
 Noted by triage.

## 2024-11-01 NOTE — Progress Notes (Signed)
 Patient ambulated in hallway on room air maintaining oxygen saturation at 96%.

## 2024-11-01 NOTE — Telephone Encounter (Signed)
 Copied from CRM [id number]. Topic: General - Other >> Oct 31, 2024  4:58 PM Wess RAMAN wrote: Reason for CRM: Patient missed call from [facility], LPN to discuss most recent most recent Inpatient/ED visit.  Callback #: [id number]

## 2024-11-07 NOTE — Transitions of Care (Post Inpatient/ED Visit) (Signed)
" ° °  11/07/2024  Name: Michele Craig MRN: [id number] DOB: 08-20-1971  Today's TOC FU Call Status: Today's TOC FU Call Status:: Unsuccessful Call (3rd Attempt) Unsuccessful Call (1st Attempt) Date: 10/31/24 Unsuccessful Call (2nd Attempt) Date: 11/01/24 Unsuccessful Call (3rd Attempt) Date: 11/07/24  Attempted to reach the patient regarding the most recent Inpatient/ED visit.  Follow Up Plan: No further outreach attempts will be made at this time. We have been unable to contact the patient.  Signature Julian Lemmings, LPN [facility] Nurse Health Advisor Direct Dial [phone]  "

## 2024-11-18 ENCOUNTER — Encounter (HOSPITAL_COMMUNITY)
Admission: RE | Disposition: A | Discharge status: Home / Self Care | Payer: Self-pay | Source: Home / Self Care | Attending: Cardiology

## 2024-11-18 ENCOUNTER — Ambulatory Visit (HOSPITAL_COMMUNITY)
Admission: RE | Admit: 2024-11-18 | Discharge: 2024-11-18 | Disposition: A | Discharge status: Home / Self Care | Attending: Cardiology | Admitting: Cardiology

## 2024-11-18 ENCOUNTER — Other Ambulatory Visit: Payer: Self-pay

## 2024-11-18 DIAGNOSIS — G4733 Obstructive sleep apnea (adult) (pediatric): Secondary | ICD-10-CM | POA: Insufficient documentation

## 2024-11-18 DIAGNOSIS — E119 Type 2 diabetes mellitus without complications: Secondary | ICD-10-CM | POA: Insufficient documentation

## 2024-11-18 DIAGNOSIS — E669 Obesity, unspecified: Secondary | ICD-10-CM | POA: Insufficient documentation

## 2024-11-18 DIAGNOSIS — Z79899 Other long term (current) drug therapy: Secondary | ICD-10-CM | POA: Insufficient documentation

## 2024-11-18 DIAGNOSIS — Z6841 Body Mass Index (BMI) 40.0 and over, adult: Secondary | ICD-10-CM | POA: Insufficient documentation

## 2024-11-18 DIAGNOSIS — I110 Hypertensive heart disease with heart failure: Secondary | ICD-10-CM | POA: Insufficient documentation

## 2024-11-18 DIAGNOSIS — I5022 Chronic systolic (congestive) heart failure: Secondary | ICD-10-CM

## 2024-11-18 DIAGNOSIS — I428 Other cardiomyopathies: Secondary | ICD-10-CM | POA: Diagnosis not present

## 2024-11-18 DIAGNOSIS — Z7985 Long-term (current) use of injectable non-insulin antidiabetic drugs: Secondary | ICD-10-CM | POA: Insufficient documentation

## 2024-11-18 DIAGNOSIS — I5032 Chronic diastolic (congestive) heart failure: Secondary | ICD-10-CM | POA: Diagnosis not present

## 2024-11-18 DIAGNOSIS — M069 Rheumatoid arthritis, unspecified: Secondary | ICD-10-CM | POA: Insufficient documentation

## 2024-11-18 HISTORY — PX: RIGHT HEART CATH: CATH118263

## 2024-11-18 LAB — POCT I-STAT EG7
Acid-Base Excess: 0 mmol/L (ref 0.0–2.0)
Acid-Base Excess: 1 mmol/L (ref 0.0–2.0)
Bicarbonate: 26.3 mmol/L (ref 20.0–28.0)
Bicarbonate: 26.8 mmol/L (ref 20.0–28.0)
Calcium, Ion: 1.18 mmol/L (ref 1.15–1.40)
Calcium, Ion: 1.19 mmol/L (ref 1.15–1.40)
HCT: 37 % (ref 36.0–46.0)
HCT: 39 % (ref 36.0–46.0)
Hemoglobin: 12.6 g/dL (ref 12.0–15.0)
Hemoglobin: 13.3 g/dL (ref 12.0–15.0)
O2 Saturation: 69 %
O2 Saturation: 72 %
Potassium: 3 mmol/L — ABNORMAL LOW (ref 3.5–5.1)
Potassium: 3 mmol/L — ABNORMAL LOW (ref 3.5–5.1)
Sodium: 145 mmol/L (ref 135–145)
Sodium: 145 mmol/L (ref 135–145)
TCO2: 28 mmol/L (ref 22–32)
TCO2: 28 mmol/L (ref 22–32)
pCO2, Ven: 47.1 mmHg (ref 44–60)
pCO2, Ven: 47.9 mmHg (ref 44–60)
pH, Ven: 7.355 (ref 7.25–7.43)
pH, Ven: 7.355 (ref 7.25–7.43)
pO2, Ven: 38 mmHg (ref 32–45)
pO2, Ven: 40 mmHg (ref 32–45)

## 2024-11-18 LAB — PREGNANCY, URINE: Preg Test, Ur: NEGATIVE

## 2024-11-18 MED ORDER — SODIUM CHLORIDE 0.9 % IV SOLN: 250.0000 mL | INTRAVENOUS | Status: DC | PRN

## 2024-11-18 MED ORDER — LIDOCAINE HCL (PF) 1 % IJ SOLN
INTRAMUSCULAR | Status: DC | PRN
  Administered 2024-11-18: 5 mL via INTRADERMAL

## 2024-11-18 MED ORDER — HEPARIN (PORCINE) IN NACL 1000-0.9 UT/500ML-% IV SOLN
INTRAVENOUS | Status: DC | PRN
  Administered 2024-11-18: 500 mL

## 2024-11-18 MED ORDER — SODIUM CHLORIDE 0.9% FLUSH: 3.0000 mL | INTRAVENOUS | Status: DC | PRN

## 2024-11-18 MED ORDER — FREE WATER: 250.0000 mL | Freq: Once | Status: DC

## 2024-11-18 MED ORDER — LIDOCAINE HCL (PF) 1 % IJ SOLN
INTRAMUSCULAR | Status: AC
  Filled 2024-11-18: qty 30

## 2024-11-18 MED ORDER — SODIUM CHLORIDE 0.9% FLUSH: 3.0000 mL | Freq: Two times a day (BID) | INTRAVENOUS | Status: DC

## 2024-11-18 NOTE — Discharge Instructions (Signed)

## 2024-11-18 NOTE — Interval H&P Note (Signed)
 History and Physical Interval Note:  11/18/2024 8:00 AM  Michele Craig  has presented today for surgery, with the diagnosis of Heart Failure.  The various methods of treatment have been discussed with the patient and family. After consideration of risks, benefits and other options for treatment, the patient has consented to  Procedures: RIGHT HEART CATH (N/A) as a surgical intervention.  The patient's history has been reviewed, patient examined, no change in status, stable for surgery.  I have reviewed the patient's chart and labs.  Questions were answered to the patient's satisfaction.     [name]

## 2024-11-18 NOTE — Progress Notes (Signed)
 Pt and husband received discharge instructions, teach back performed. IV removed, no complications. Rt brachial site is clean dry intact, site is soft, no signs of bleeding. Pt escorted out via wheelchair to husband's vehicle.

## 2024-11-20 ENCOUNTER — Encounter (HOSPITAL_COMMUNITY): Payer: Self-pay | Admitting: Cardiology

## 2024-11-21 ENCOUNTER — Ambulatory Visit: Payer: Self-pay | Admitting: Nurse Practitioner

## 2024-11-21 NOTE — Progress Notes (Unsigned)
 "  Michele Craig 03/13/1971 [id number]   History:  54 y.o. [id number] presents for annual exam. Mirena  IUD 04/2021. Normal pap history.  History of HTN, DM, CHF. Had heart cath 1/9.  Gynecologic History No LMP recorded. (Menstrual status: IUD).   Contraception/Family planning: IUD  Health Maintenance Last Pap: 03/12/2021. Results were: Normal neg HPV Last mammogram: 5 years ago per patient. Results were: normal Last colonoscopy: Never Last Dexa: N/A     06/11/2023    4:55 PM  Depression screen PHQ 2/9  Decreased Interest 3  Down, Depressed, Hopeless 3  PHQ - 2 Score 6  Altered sleeping 3  Tired, decreased energy 2  Change in appetite 1  Feeling bad or failure about yourself  3  Trouble concentrating 2  Moving slowly or fidgety/restless 2  Suicidal thoughts 0  PHQ-9 Score 19      Data saved with a previous flowsheet row definition     Past medical history, past surgical history, family history and social history were all reviewed and documented in the EPIC chart. Married. 4 children ages 7-27.  ROS:  A ROS was performed and pertinent positives and negatives are included.  Exam:  There were no vitals filed for this visit.  There is no height or weight on file to calculate BMI.  General appearance:  Normal Thyroid:  Symmetrical, normal in size, without palpable masses or nodularity. Respiratory  Auscultation:  Clear without wheezing or rhonchi Cardiovascular  Auscultation:  Regular rate, without rubs, murmurs or gallops  Edema/varicosities:  Not grossly evident Abdominal  Soft,nontender, without masses, guarding or rebound.  Liver/spleen:  No organomegaly noted  Hernia:  None appreciated  Skin  Inspection:  Grossly normal Breasts: Examined lying and sitting.   Right: Without masses, retractions, nipple discharge or axillary adenopathy.   Left: Without masses, retractions, nipple discharge or axillary adenopathy. Gentitourinary   Inguinal/mons:  Normal without  inguinal adenopathy  External genitalia:  Normal appearing vulva with no masses, tenderness, or lesions  BUS/Urethra/Skene's glands:  Normal  Vagina:  Normal appearing with normal color and discharge, no lesions  Cervix:  Normal appearing without discharge or lesions. IUD string not visible  Uterus:  Anteverted. Difficult to palpate due to body habitus but no grosses masses or tenderness  Adnexa/parametria:     Rt: Normal in size, without masses or tenderness.   Lt: Normal in size, without masses or tenderness.  Anus and perineum: Normal  Digital rectal exam: Normal sphincter tone without palpated masses or tenderness  Assessment/Plan:  54 y.o. [id number] for annual exam.   Well female exam with routine gynecological exam - Plan: Cytology - PAP( [facility]). Education provided on SBEs, importance of preventative screenings, current guidelines, high calcium diet, regular exercise, and multivitamin daily. Labs with PCP.   IUD (intrauterine device) in place - Mirena  inserted in 2017 for menorrhagia. Would like exchange if necessary. If FSH in postmenopausal range we will remove, if low we will exchange. She is agreeable.   Hot flashes - Plan: Follicle stimulating hormone. Having hot flashes, night sweats, and mood changes.   Amenorrhea - Plan: Follicle stimulating hormone.  Screening for cervical cancer - Normal Pap history.  Pap with HR HPV today.  Screening for breast cancer - Normal mammogram history. Overdue for mammogram. Information provided on [facility] and she will schedule this soon. Normal breast exam today.  Screening for colon cancer - Has not had screening colonoscopy. Information provided on [facility] and encouraged to schedule  this soon.   Return in 1 year for annual.    Michele Craig Shutter DNP, 10:55 AM 11/21/2024  "

## 2024-11-23 ENCOUNTER — Ambulatory Visit: Admitting: Primary Care

## 2024-11-23 ENCOUNTER — Encounter: Payer: Self-pay | Admitting: Primary Care

## 2024-12-14 ENCOUNTER — Telehealth (HOSPITAL_COMMUNITY): Payer: Self-pay

## 2024-12-14 NOTE — Telephone Encounter (Signed)
 Called to confirm/remind patient of their appointment at the [facility] on 12/14/24.   Appointment:   [] Confirmed  [] Left mess   [] No answer/No voice mail  [x] VM Full/unable to leave message  [] Phone not in service  Patient reminded to bring all medications and/or complete list.  Confirmed patient has transportation. Gave directions, instructed to utilize valet parking.

## 2024-12-15 ENCOUNTER — Ambulatory Visit (HOSPITAL_COMMUNITY)

## 2024-12-15 NOTE — Progress Notes (Incomplete)
 "    HEART & VASCULAR TRANSITION OF CARE CONSULT NOTE   Referring Physician: Dr. Vicci PCP: Tia Darice SAILOR, NP  Cardiologist: None  HPI: Referred to clinic by Dr. Vicci for heart failure consultation.   Michele Craig is a 54 y.o. female with history of systolic heart failure, rheumatoid arthritis, diabetes, OSA (not on CPAP) and gastric sleeve.   Diagnosed w/ HF in 2023. Echo 7/23 EF 40-45%, RV normal. EKGs demonstrated LBBB, QRS 148 ms. Subsequent LHC demonstrated normal coronaries. LVEDP 10. She was noted on cath to have dyssynchronous wall motion due to left bundle. She was lost to f/u w/ gen cards after cath.    Seen at [facility] ED 10/23/25 given complaints of worsening Jullian Craig and dyspnea. Echo showed improved EF 50-55%, RV nl. Did not appear overloaded by labs or imaging. SCr bumped to 1.3 with diuresis. Transitioned to oral lasix  20 mg daily.    She presented back on 10/30/25 w/ complaints of recurrent dyspnea and leg pain. proBNP still normal (<0.50). CT of chest negative for PE. No focal consolidation of PE. LE venous dopplers done prior admission were also negative for DVT. Hs trop negative x 1. Lasix  was stopped and she was placed on Torsemide  40 mg daily. Continued on spiro.   Seen in [facility] for follow up and scheduled for an underwent RHC with Dr. Zenaida for [facility] evaluation. Hemodynamics all within normal range.  She was scheduled to establish care with pulmonology, however no showed appointment.      She says she was diagnosed w/ OSA several years ago but stopped CPAP after snoring resolved w/ wt loss.    She is scheduled to see pulmonology next month.      Today {he/she/they:23295} presents for transition of care visit. Overall feeling ***. NYHA ***. Reports {Symptoms; cardiac:12860::dyspnea,fatigue}. Denies {Symptoms; cardiac:12860::chest pain,dyspnea,fatigue,near-syncope,orthopnea,palpitations,dizziness,abnormal bleeding}. Able to perform ADLs. Appetite  okay. Weight at home ***. BP at home***. Compliant with all medications. Denies ETOH, tobacco, or drug use.    Cardiac Testing:   Echo 12/25 EF 50-55%, nl RV, no valvular disease LHC 7/23 normal coronaries, dyssynchronous LV wall motion Echo 7/23 EF 40-45%  Past Medical History:  Diagnosis Date   Anemia    Anxiety    Arthritis    Complication of anesthesia    with carpal tunnel syndrome the block didn't work well, she could feel things during the surgery   Depression    Family history of adverse reaction to anesthesia    unknown, pt is adopted   Fibromyalgia    Foot pain    GERD (gastroesophageal reflux disease)    Headache    Hypertension    IBS (irritable bowel syndrome)    after cholecystectomy and then it went away after Gastric sleeve   LBBB (left bundle branch block)    Moderate persistent asthma    OSA (obstructive sleep apnea)    does not use cpap   RA (rheumatoid arthritis) (HCC)    Shortness of breath dyspnea    only when her asthma is flaring up   Type 2 diabetes mellitus (HCC)    pt states after her gastric sleeve procedure she is no longer on medications and is not checked for it    Current Outpatient Medications  Medication Sig Dispense Refill   acetaminophen  (TYLENOL ) 500 MG tablet Take 2 tablets (1,000 mg total) by mouth 3 (three) times daily.     ADVAIR  DISKUS 250-50 MCG/DOSE AEPB Inhale 1 puff into the lungs  2 (two) times daily.     albuterol  (VENTOLIN  HFA) 108 (90 Base) MCG/ACT inhaler Inhale 2 puffs into the lungs every 4 (four) hours as needed for wheezing or shortness of breath.     ARIPiprazole  (ABILIFY ) 10 MG tablet Take 10 mg by mouth every morning.     buPROPion  (WELLBUTRIN  XL) 150 MG 24 hr tablet Take 150 mg by mouth every morning.     clonazePAM  (KLONOPIN ) 2 MG tablet Take 1 tablet (2 mg total) by mouth at bedtime as needed for anxiety. 15 tablet 0   DULoxetine  (CYMBALTA ) 60 MG capsule Take 1 capsule (60 mg total) by mouth every morning. 30  capsule 0   fluticasone  (FLONASE ) 50 MCG/ACT nasal spray Place 1 spray into both nostrils at bedtime.     fluticasone  (FLOVENT  HFA) 44 MCG/ACT inhaler Inhale 1 puff into the lungs daily as needed (for shortness of breath).     gabapentin  (NEURONTIN ) 100 MG capsule Take 400 mg by mouth 2 (two) times daily.     guaiFENesin -codeine  100-10 MG/5ML syrup Take 5 mLs by mouth 3 (three) times daily as needed for cough. 120 mL 0   levocetirizine (XYZAL ) 5 MG tablet Take 5 mg by mouth every evening.     levonorgestrel  (MIRENA , 52 MG,) 20 MCG/DAY IUD Mirena  20 mcg/24 hours (7 yrs) 52 mg intrauterine device  Take 1 device by intrauterine route.     methocarbamol  (ROBAXIN ) 500 MG tablet Take 500 mg by mouth 3 (three) times daily as needed for muscle spasms.     omeprazole (PRILOSEC) 20 MG capsule Take 20 mg by mouth every evening.     potassium chloride  SA (KLOR-CON  M) 10 MEQ tablet Take 1 tablet (10 mEq total) by mouth daily. 30 tablet 0   spironolactone  (ALDACTONE ) 25 MG tablet Take 1 tablet (25 mg total) by mouth daily. 30 tablet 1   SUMAtriptan  (IMITREX ) 100 MG tablet Take 100 mg by mouth every 2 (two) hours as needed for migraine.     torsemide  (DEMADEX ) 20 MG tablet Take 2 tablets (40 mg total) by mouth daily. 60 tablet 1   traZODone  (DESYREL ) 100 MG tablet Take 100 mg by mouth at bedtime.     Vitamin D , Ergocalciferol , (DRISDOL ) 1.25 MG (50000 UNIT) CAPS capsule Take 50,000 Units by mouth every Sunday.     [Paused] WEGOVY 1.7 MG/0.75ML SOAJ SQ injection Inject 1.7 mg into the skin once a week. (Patient not taking: Reported on 10/23/2024)     No current facility-administered medications for this visit.    Allergies[1]  Social History   Socioeconomic History   Marital status: Married    Spouse name: Cornell   Number of children: Not on file   Years of education: Not on file   Highest education level: Not on file  Occupational History   Not on file  Tobacco Use   Smoking status: Never    Smokeless tobacco: Never  Vaping Use   Vaping status: Never Used  Substance and Sexual Activity   Alcohol use: Not Currently   Drug use: No   Sexual activity: Yes    Birth control/protection: I.U.D.    Comment: 1st intercourse 10 yo-5 partners  Other Topics Concern   Not on file  Social History Narrative   Not on file   Social Drivers of Health   Tobacco Use: Low Risk (11/01/2024)   Patient History    Smoking Tobacco Use: Never    Smokeless Tobacco Use: Never    Passive  Exposure: Not on file  Financial Resource Strain: Not on file  Food Insecurity: No Food Insecurity (10/23/2024)   Epic    Worried About Programme Researcher, Broadcasting/film/video in the Last Year: Never true    Ran Out of Food in the Last Year: Never true  Transportation Needs: No Transportation Needs (10/23/2024)   Epic    Lack of Transportation (Medical): No    Lack of Transportation (Non-Medical): No  Physical Activity: Not on file  Stress: Not on file  Social Connections: Not on file  Intimate Partner Violence: Not At Risk (10/23/2024)   Epic    Fear of Current or Ex-Partner: No    Emotionally Abused: No    Physically Abused: No    Sexually Abused: No  Depression (PHQ2-9): High Risk (06/11/2023)   Depression (PHQ2-9)    PHQ-2 Score: 19  Alcohol Screen: Not on file  Housing: Low Risk (10/23/2024)   Epic    Unable to Pay for Housing in the Last Year: No    Number of Times Moved in the Last Year: 0    Homeless in the Last Year: No  Utilities: Not At Risk (10/23/2024)   Epic    Threatened with loss of utilities: No  Health Literacy: Not on file    Family History  Adopted: Yes  Family history unknown: Yes    There were no vitals filed for this visit.  There were no vitals filed for this visit.  PHYSICAL EXAM: General: *** appearing. No distress  Cardiac: JVP ~*** cm. No murmurs  Resp: Lung sounds clear and equal B/L Abdomen: Soft, non-distended.  Extremities: Warm and dry.  *** edema.  Neuro: A&O x3. Affect  pleasant.   ECG (personally reviewed):  ASSESSMENT & PLAN:  1. HFimpEF  - NICM, Echo 2023 EF 40-45%, RV ok>>LHC normal cors - Echo EF 12/25 EF improved 50-55%, RV ok per report  - NYHA II confounded by obesity/deconditioning  - PH evaluation negative on RHC 1/26 also with normal hemodynamics - Gen Card in [place]   2. Probable OSA and OHS - pt no longer using CPAP, she discontinued after wt loss surgery but still obese - recommend repeat assessment w/ sleep study. Can d/w pulmonology at upcoming visit  - would also benefit from GLP1   3. BLE swelling - b/l lower extremity US  w calf edema w/o DVT - venous insufficiency vs lymphedema  4. Obesity - There is no height or weight on file to calculate BMI.   Referred to HFSW (PCP, Medications, Transportation, ETOH Abuse, Drug Abuse, Insurance, Financial ): No Refer to Pharmacy: No Refer to Home Health: No Refer to Advanced Heart Failure Clinic: No  Refer to General Cardiology: Yes  Lalitha Ilyas, NP 12/15/24     [1]  Allergies Allergen Reactions   Penicillins Hives, Itching, Dermatitis and Rash   Shellfish Allergy Anaphylaxis and Other (See Comments)    Crawdads   Cefadroxil  Hives, Diarrhea, Rash and Other (See Comments)    Abdominal pain   Clindamycin Hcl Other (See Comments)    Unknown    Zosyn  [Piperacillin  Sod-Tazobactam So] Hives and Itching   Clindamycin Itching, Nausea Only, Rash and Other (See Comments)    Stomach pains   "
